# Patient Record
Sex: Female | Born: 1980 | Race: Black or African American | Hispanic: No | Marital: Single | Smoking: Current every day smoker
Health system: Southern US, Community
[De-identification: ages and names within clinical notes are randomized; demographics above are authoritative.]

## PROBLEM LIST (undated history)

## (undated) DIAGNOSIS — Z720 Tobacco use: Secondary | ICD-10-CM

## (undated) DIAGNOSIS — C259 Malignant neoplasm of pancreas, unspecified: Secondary | ICD-10-CM

## (undated) DIAGNOSIS — Z3483 Encounter for supervision of other normal pregnancy, third trimester: Secondary | ICD-10-CM

## (undated) DIAGNOSIS — Z86711 Personal history of pulmonary embolism: Secondary | ICD-10-CM

---

## 2010-09-14 DIAGNOSIS — I639 Cerebral infarction, unspecified: Secondary | ICD-10-CM

## 2010-09-14 HISTORY — DX: Cerebral infarction, unspecified: I63.9

## 2011-02-26 ENCOUNTER — Inpatient Hospital Stay (HOSPITAL_COMMUNITY)
Admission: EM | Admit: 2011-02-26 | Discharge: 2011-02-28 | DRG: 766 | Disposition: A | Discharge status: Home / Self Care | Payer: Medicaid Other | Source: Ambulatory Visit | Attending: Obstetrics and Gynecology | Admitting: Obstetrics and Gynecology

## 2011-02-26 ENCOUNTER — Inpatient Hospital Stay (HOSPITAL_COMMUNITY)
Admission: RE | Admit: 2011-02-26 | Payer: Medicaid Other | Source: Ambulatory Visit | Admitting: Obstetrics and Gynecology

## 2011-02-26 ENCOUNTER — Other Ambulatory Visit: Payer: Self-pay | Admitting: Obstetrics and Gynecology

## 2011-02-26 DIAGNOSIS — O34219 Maternal care for unspecified type scar from previous cesarean delivery: Principal | ICD-10-CM | POA: Diagnosis present

## 2011-02-26 DIAGNOSIS — O9903 Anemia complicating the puerperium: Secondary | ICD-10-CM | POA: Diagnosis not present

## 2011-02-26 DIAGNOSIS — D649 Anemia, unspecified: Secondary | ICD-10-CM | POA: Diagnosis not present

## 2011-02-26 LAB — RAPID URINE DRUG SCREEN, HOSP PERFORMED
Amphetamines: NOT DETECTED
Cocaine: NOT DETECTED
Opiates: NOT DETECTED
Opiates: NOT DETECTED
Tetrahydrocannabinol: NOT DETECTED

## 2011-02-26 LAB — CBC
Hemoglobin: 12 g/dL (ref 12.0–15.0)
MCH: 30.2 pg (ref 26.0–34.0)
MCHC: 34.1 g/dL (ref 30.0–36.0)
RDW: 14.5 % (ref 11.5–15.5)

## 2011-02-26 LAB — SURGICAL PCR SCREEN: Staphylococcus aureus: NEGATIVE

## 2011-02-27 LAB — CBC
HCT: 29.3 % — ABNORMAL LOW (ref 36.0–46.0)
MCH: 29.9 pg (ref 26.0–34.0)
MCV: 88.5 fL (ref 78.0–100.0)
RBC: 3.31 MIL/uL — ABNORMAL LOW (ref 3.87–5.11)
WBC: 10.9 10*3/uL — ABNORMAL HIGH (ref 4.0–10.5)

## 2011-03-01 ENCOUNTER — Inpatient Hospital Stay (HOSPITAL_COMMUNITY): Admission: AD | Admit: 2011-03-01 | Payer: Self-pay | Source: Ambulatory Visit | Admitting: Family Medicine

## 2011-03-04 NOTE — Op Note (Signed)
[name], [name]             ACCOUNT NO.:  [id number]  MEDICAL RECORD NO.:  [id number]  LOCATION:  9101                          FACILITY:  [facility]  PHYSICIAN:  Hal Morales, M.D.DATE OF BIRTH:  1980-09-25  DATE OF PROCEDURE:  02/26/2011 DATE OF DISCHARGE:                              OPERATIVE REPORT   PREOPERATIVE DIAGNOSES:  Intrauterine pregnancy at 70 weeks' gestation, prior cesarean section, desire for repeat cesarean section.  POSTOPERATIVE DIAGNOSES:  Intrauterine pregnancy at [redacted] weeks gestation.                           Prior Cesarean section.                           Desire for repeat cesarean section                           Meconium stained amniotic fluid SURGEON:  Hal Morales, MD  FIRST ASSISTANT:  Elmira J. Lowell Guitar, PA  ANESTHESIA:  Spinal.  ESTIMATED BLOOD LOSS:  Less than 700 mL.  COMPLICATIONS:  None.  FINDINGS:  The patient was delivered of a female infant weighing 6 pounds 3 ounces with Apgars of 9 and 9 at 1 and 5 minutes respectively. The uterus, tubes, and ovaries were normal for the gravid state.  The placenta contained an eccentrically inserted three-vessel cord and several areas of calcification in a bilobed placenta.  PROCEDURE IN DETAIL:  The patient was taken to the operating room after appropriate identification and placed on the operating table.  After the placement of a spinal anesthetic, she was placed in the supine position with a left lateral tilt.  The abdomen and perineum were prepped with multiple layers of Betadine and a Foley catheter inserted into the bladder and then connected to straight drainage.  The abdomen was draped as a sterile field.  After assurance of adequate surgical anesthesia, suprapubic injection of 10 mL of 0.25% Marcaine was undertaken.  A suprapubic incision was made and the abdomen opened in layers.  The peritoneum was entered and the bladder blade placed.  The uterus was incised approximately 2  cm above the uterovesical fold and that incision taken laterally on either side bluntly.  The infant was delivered from the occiput anterior position and the fluid was noted to be meconium stained.  The cord was clamped and cut and the infant handed off to the awaiting pediatricians.  The appropriate cord blood was drawn and the placenta allowed to separate from the uterus and then was removed from the operative field.  The uterine cavity was cleared of membranes and the uterine incision closed with running interlocking suture of 0- Vicryl.  An imbricating suture of 0-Vicryl was then placed.  Hemostasis was achieved with several figure-of-eight sutures of 0-Vicryl long the suture line.  Copious irrigation was carried out.  The bladder flap was reapproximated with a single figure-of-eight suture of 0-Vicryl.  The abdominal peritoneum was then closed with a running suture of 2-0 Vicryl.  The rectus muscles were reapproximated in the midline with figure-of-eight  suture of 2-0 Vicryl.  The fascia was closed with a running suture of 0-Vicryl, then reinforced on either side of midline with figure-of-eight sutures of 0-Vicryl.  The subcutaneous tissue was copiously irrigated and made hemostatic with Bovie cautery.  The skin incision was closed with subcuticular sutures of 3-0 Monocryl.  A sterile dressing was applied.  The patient was taken from the operating room to the recovery room in satisfactory condition having tolerated the procedure well with sponge and instrument counts correct.  SPECIMENS TO PATHOLOGY:  Placenta and urine for urine drug screen.     Hal Morales, M.D.     VPH/MEDQ  D:  02/26/2011  T:  02/27/2011  Job:  [id number]  Electronically Signed by Dierdre Forth M.D. on 03/04/2011 10:34:33 AM

## 2011-03-18 NOTE — Discharge Summary (Signed)
  Cynthia [name], Cynthia [name]             ACCOUNT NO.:  [id number]  MEDICAL RECORD NO.:  [id number]  LOCATION:  9101                          [facility]  [facility]  PHYSICIAN:  Zyen Triggs A. Leevon Upperman, M.D. DATE OF BIRTH:  1980-10-23  DATE OF ADMISSION:  02/26/2011 DATE OF DISCHARGE:  02/28/2011                              DISCHARGE SUMMARY   ADMITTING DIAGNOSES: 1. Intrauterine pregnancy at 39 weeks. 2. Previous cesarean section with desire for repeat.  DISCHARGE DIAGNOSES: 1. Intrauterine pregnancy at 39 weeks. 2. Previous cesarean section with desire for repeat.  PROCEDURES: 1. Primary low transverse cesarean section. 2. Spinal anesthesia.  HOSPITAL COURSE:  Cynthia [name] is a 30 year old gravida 5, para 4-0-0-4 at 39-3/7 weeks who presented for scheduled repeat cesarean section. Her pregnancy had been remarkable for: 1. Late care. 2. History of PIH with the last pregnancy. 3. History of previous C-section secondary to nonreassuring fetal     heart rate. 4. Anemia. 5. Smoker.  On admission, there was some question of her affect.  A drug screen was done which was negative, and she was desiring to proceed with repeat cesarean section.  She was taken to the operating room by Dr. Pennie Rushing where a repeat low transverse cesarean section was performed under spinal anesthesia.  Findings were a viable female weight 6 pounds 3 ounces, Apgars were 9 and 9.  Infant was taken to the full-term nursery. Mother was taken to recovery in good condition.  Placenta was sent to Pathology secondary to significant calcium deposits.  By postop day #1, the patient was doing well.  She was up ad lib and bottle feeding.  Her vital signs were stable.  Her hemoglobin was 9.9 down from 12, white blood cell count was 10.9, and platelet count was 170.  Her chest was clear.  Physical exam was within normal limits.  Her dressing was clean, dry, and intact.  Orthostatics were done and were noted to be stable, and she was  placed on iron supplementation.  By the next day, she was continuing to do well and was feeling well after initial evaluation, she decided that she wanted to go home.  Therefore, she was evaluated again in light of that desire, and she was deemed to have received full benefit of hospital stay and was discharged home in stable condition.  She was planning Nexplanon for birth control.  Discharge instructions per [facility] handout.  DISCHARGE MEDICATIONS:  Motrin 600 mg p.o. q.6 h p.r.n. pain, Percocet 5/325 one to two p.o. 3-4 hours p.r.n. pain.  Iron supplementation one p.o. daily or the patient may get Floradix to take her oral iron supplementation per packets directions.  FOLLOWUP:  Discharge follow up will occur in 6 weeks per [facility].     Cynthia [name], C.N.M.   ______________________________ Pierre Bali Normand Sloop, M.D.    VLL/MEDQ  D:  02/28/2011  T:  03/01/2011  Job:  [id number]  Electronically Signed by Nigel Bridgeman C.N.M. on 03/03/2011 08:55:51 AM Electronically Signed by Jaymes Graff M.D. on 03/18/2011 12:58:25 PM

## 2013-01-03 ENCOUNTER — Other Ambulatory Visit (HOSPITAL_COMMUNITY): Payer: Self-pay | Admitting: Obstetrics and Gynecology

## 2013-01-03 DIAGNOSIS — Z0489 Encounter for examination and observation for other specified reasons: Secondary | ICD-10-CM

## 2013-01-05 LAB — OB RESULTS CONSOLE HIV ANTIBODY (ROUTINE TESTING): HIV: NONREACTIVE

## 2013-01-05 LAB — OB RESULTS CONSOLE GC/CHLAMYDIA: Chlamydia: NEGATIVE

## 2013-01-05 LAB — OB RESULTS CONSOLE ABO/RH: RH Type: POSITIVE

## 2013-01-05 LAB — OB RESULTS CONSOLE HEPATITIS B SURFACE ANTIGEN: Hepatitis B Surface Ag: NEGATIVE

## 2013-01-26 ENCOUNTER — Ambulatory Visit (HOSPITAL_COMMUNITY)
Admission: RE | Admit: 2013-01-26 | Discharge: 2013-01-26 | Disposition: A | Discharge status: Home / Self Care | Payer: Medicaid Other | Source: Ambulatory Visit | Attending: Obstetrics and Gynecology | Admitting: Obstetrics and Gynecology

## 2013-01-26 ENCOUNTER — Encounter (HOSPITAL_COMMUNITY): Payer: Self-pay

## 2013-01-26 VITALS — BP 120/78 | HR 80 | Wt 166.0 lb

## 2013-01-26 DIAGNOSIS — Z1389 Encounter for screening for other disorder: Secondary | ICD-10-CM | POA: Insufficient documentation

## 2013-01-26 DIAGNOSIS — Z363 Encounter for antenatal screening for malformations: Secondary | ICD-10-CM | POA: Insufficient documentation

## 2013-01-26 DIAGNOSIS — O0930 Supervision of pregnancy with insufficient antenatal care, unspecified trimester: Secondary | ICD-10-CM | POA: Insufficient documentation

## 2013-01-26 DIAGNOSIS — O34219 Maternal care for unspecified type scar from previous cesarean delivery: Secondary | ICD-10-CM | POA: Insufficient documentation

## 2013-01-26 DIAGNOSIS — Z0489 Encounter for examination and observation for other specified reasons: Secondary | ICD-10-CM

## 2013-01-26 DIAGNOSIS — O358XX0 Maternal care for other (suspected) fetal abnormality and damage, not applicable or unspecified: Secondary | ICD-10-CM | POA: Insufficient documentation

## 2013-01-26 NOTE — Progress Notes (Signed)
Cynthia [name]  was seen today for an ultrasound appointment.  See full report in [facility].  Impression: Single IUP at 37 1/7 weeks Late onset of care Limited views of the fetal anatomy obtained due to advanced gestational age and fetal position No fetal anomalies idendified Fetal growth is appropriate (40th %tile) Normal amniotic fluid volume  Recommendations: Follow-up ultrasounds as clinically indicated.   Alpha Gula, MD

## 2013-02-01 ENCOUNTER — Encounter (HOSPITAL_COMMUNITY): Payer: Self-pay | Admitting: Pharmacist

## 2013-02-09 ENCOUNTER — Encounter (HOSPITAL_COMMUNITY): Payer: Self-pay | Admitting: *Deleted

## 2013-02-09 ENCOUNTER — Encounter (HOSPITAL_COMMUNITY): Payer: Self-pay | Admitting: Anesthesiology

## 2013-02-09 ENCOUNTER — Encounter (HOSPITAL_COMMUNITY)
Admission: AD | Disposition: A | Discharge status: Home / Self Care | Payer: Self-pay | Source: Ambulatory Visit | Attending: Obstetrics and Gynecology

## 2013-02-09 ENCOUNTER — Inpatient Hospital Stay (HOSPITAL_COMMUNITY)
Admission: AD | Admit: 2013-02-09 | Discharge: 2013-02-11 | DRG: 775 | Disposition: A | Discharge status: Home / Self Care | Payer: Medicaid Other | Source: Ambulatory Visit | Attending: Obstetrics and Gynecology | Admitting: Obstetrics and Gynecology

## 2013-02-09 DIAGNOSIS — O34219 Maternal care for unspecified type scar from previous cesarean delivery: Secondary | ICD-10-CM

## 2013-02-09 LAB — CBC
MCH: 29 pg (ref 26.0–34.0)
MCHC: 34.2 g/dL (ref 30.0–36.0)
MCV: 84.9 fL (ref 78.0–100.0)
Platelets: 168 10*3/uL (ref 150–400)
RDW: 14.1 % (ref 11.5–15.5)

## 2013-02-09 SURGERY — Surgical Case
Anesthesia: Regional

## 2013-02-09 MED ORDER — BENZOCAINE-MENTHOL 20-0.5 % EX AERO: 1.0000 "application " | INHALATION_SPRAY | CUTANEOUS | Status: DC | PRN

## 2013-02-09 MED ORDER — WITCH HAZEL-GLYCERIN EX PADS: 1.0000 "application " | MEDICATED_PAD | CUTANEOUS | Status: DC | PRN

## 2013-02-09 MED ORDER — DIPHENHYDRAMINE HCL 25 MG PO CAPS: 25.0000 mg | ORAL_CAPSULE | Freq: Four times a day (QID) | ORAL | Status: DC | PRN

## 2013-02-09 MED ORDER — SIMETHICONE 80 MG PO CHEW: 80.0000 mg | CHEWABLE_TABLET | ORAL | Status: DC | PRN

## 2013-02-09 MED ORDER — OXYTOCIN 40 UNITS IN LACTATED RINGERS INFUSION - SIMPLE MED
INTRAVENOUS | Status: AC
  Filled 2013-02-09: qty 1000

## 2013-02-09 MED ORDER — LACTATED RINGERS IV SOLN
INTRAVENOUS | Status: DC
  Administered 2013-02-09: 17:00:00 via INTRAVENOUS

## 2013-02-09 MED ORDER — ONDANSETRON HCL 4 MG PO TABS: 4.0000 mg | ORAL_TABLET | ORAL | Status: DC | PRN

## 2013-02-09 MED ORDER — PRENATAL MULTIVITAMIN CH
1.0000 | ORAL_TABLET | Freq: Every day | ORAL | Status: DC
  Administered 2013-02-10 – 2013-02-11 (×2): 1 via ORAL
  Filled 2013-02-09 (×2): qty 1

## 2013-02-09 MED ORDER — OXYTOCIN 10 UNIT/ML IJ SOLN
INTRAMUSCULAR | Status: AC
  Filled 2013-02-09: qty 2

## 2013-02-09 MED ORDER — DIBUCAINE 1 % RE OINT: 1.0000 "application " | TOPICAL_OINTMENT | RECTAL | Status: DC | PRN

## 2013-02-09 MED ORDER — ONDANSETRON HCL 4 MG/2ML IJ SOLN: 4.0000 mg | INTRAMUSCULAR | Status: DC | PRN

## 2013-02-09 MED ORDER — IBUPROFEN 600 MG PO TABS
600.0000 mg | ORAL_TABLET | Freq: Four times a day (QID) | ORAL | Status: DC
  Administered 2013-02-09 – 2013-02-11 (×8): 600 mg via ORAL
  Filled 2013-02-09 (×7): qty 1

## 2013-02-09 MED ORDER — OXYCODONE-ACETAMINOPHEN 5-325 MG PO TABS: 1.0000 | ORAL_TABLET | ORAL | Status: DC | PRN

## 2013-02-09 MED ORDER — SENNOSIDES-DOCUSATE SODIUM 8.6-50 MG PO TABS
2.0000 | ORAL_TABLET | Freq: Every day | ORAL | Status: DC
  Administered 2013-02-09 – 2013-02-10 (×2): 2 via ORAL

## 2013-02-09 MED ORDER — ZOLPIDEM TARTRATE 5 MG PO TABS: 5.0000 mg | ORAL_TABLET | Freq: Every evening | ORAL | Status: DC | PRN

## 2013-02-09 MED ORDER — LANOLIN HYDROUS EX OINT: TOPICAL_OINTMENT | CUTANEOUS | Status: DC | PRN

## 2013-02-09 MED ORDER — TETANUS-DIPHTH-ACELL PERTUSSIS 5-2.5-18.5 LF-MCG/0.5 IM SUSP: 0.5000 mL | Freq: Once | INTRAMUSCULAR | Status: DC

## 2013-02-09 SURGICAL SUPPLY — 31 items
CLAMP CORD UMBIL (MISCELLANEOUS) IMPLANT
CLOTH BEACON ORANGE TIMEOUT ST (SAFETY) IMPLANT
CONTAINER PREFILL 10% NBF 15ML (MISCELLANEOUS) IMPLANT
DRAPE LG THREE QUARTER DISP (DRAPES) IMPLANT
DRSG OPSITE POSTOP 4X10 (GAUZE/BANDAGES/DRESSINGS) IMPLANT
DRSG VASELINE 3X18 (GAUZE/BANDAGES/DRESSINGS) IMPLANT
DURAPREP 26ML APPLICATOR (WOUND CARE) IMPLANT
ELECT REM PT RETURN 9FT ADLT (ELECTROSURGICAL)
ELECTRODE REM PT RTRN 9FT ADLT (ELECTROSURGICAL) IMPLANT
EXTRACTOR VACUUM KIWI (MISCELLANEOUS) IMPLANT
EXTRACTOR VACUUM M CUP 4 TUBE (SUCTIONS) IMPLANT
GLOVE BIO SURGEON STRL SZ7.5 (GLOVE) IMPLANT
GOWN PREVENTION PLUS XLARGE (GOWN DISPOSABLE) IMPLANT
GOWN STRL REIN XL XLG (GOWN DISPOSABLE) IMPLANT
KIT ABG SYR 3ML LUER SLIP (SYRINGE) IMPLANT
NEEDLE HYPO 25X5/8 SAFETYGLIDE (NEEDLE) IMPLANT
NS IRRIG 1000ML POUR BTL (IV SOLUTION) IMPLANT
PACK C SECTION WH (CUSTOM PROCEDURE TRAY) IMPLANT
PAD OB MATERNITY 4.3X12.25 (PERSONAL CARE ITEMS) IMPLANT
RTRCTR C-SECT PINK 25CM LRG (MISCELLANEOUS) IMPLANT
STAPLER VISISTAT 35W (STAPLE) IMPLANT
SUT PLAIN 0 NONE (SUTURE) IMPLANT
SUT VIC AB 0 CT1 36 (SUTURE) IMPLANT
SUT VIC AB 3-0 CTX 36 (SUTURE) IMPLANT
SUT VIC AB 3-0 SH 27 (SUTURE)
SUT VIC AB 3-0 SH 27X BRD (SUTURE) IMPLANT
SUT VIC AB 4-0 KS 27 (SUTURE) IMPLANT
SUT VICRYL 0 TIES 12 18 (SUTURE) IMPLANT
TOWEL OR 17X24 6PK STRL BLUE (TOWEL DISPOSABLE) IMPLANT
TRAY FOLEY CATH 14FR (SET/KITS/TRAYS/PACK) IMPLANT
WATER STERILE IRR 1000ML POUR (IV SOLUTION) IMPLANT

## 2013-02-09 NOTE — MAU Note (Signed)
Patient states she is having contractions every 10 minutes with slight discharge. Denies bleeding or leaking fluid and reports good fetal movement.

## 2013-02-09 NOTE — Progress Notes (Addendum)
Pt mother came to nurses station and yelled out the baby is coming. Went into room. Crowning noted. Elige Radon called and at bedside for delivery. Pt pushed and fetal head delivered. Followed by shoulders NSVD of viable baby girl @1546 .

## 2013-02-09 NOTE — MAU Note (Signed)
Patient is scheduled for a repeat cesarean section on 6-4.

## 2013-02-09 NOTE — MAU Provider Note (Signed)
  History     CSN: [id number]  Arrival date and time: 02/09/13 1352   None     Chief Complaint  Patient presents with  . Labor Eval   HPI OB History   Grav Para Term Preterm Abortions TAB SAB Ect Mult Living   6 5 5       5       Past Medical History  Diagnosis Date  . Medical history non-contributory     Past Surgical History  Procedure Laterality Date  . Cesarean section      No family history on file.  History  Substance Use Topics  . Smoking status: Current Every Day Smoker    Types: Cigarettes  . Smokeless tobacco: Not on file  . Alcohol Use: No    Allergies: No Known Allergies  Prescriptions prior to admission  Medication Sig Dispense Refill  . Prenatal Vit-Fe Fumarate-FA (PRENATAL MULTIVITAMIN) TABS Take 1 tablet by mouth daily at 12 noon.        ROS Physical Exam   Blood pressure 127/75, pulse 62, temperature 98.4 F (36.9 C), temperature source Oral, resp. rate 16, height 5' 3.5" (1.613 m), weight 164 lb 12.8 oz (74.753 kg), last menstrual period 05/11/2012, SpO2 100.00%.  Physical Exam  MAU Course  Procedures  MDM  Assessment and Plan  f2  Loyd Salvador F 02/09/2013, 4:16 PM The pt delivered in MAU I was called after the delivery. When I arrived the placenta had delivered. I inspected the perineum and there were no lacerations The uterus felt intact and clots were evacuated. The uterus felt firm

## 2013-02-09 NOTE — H&P (Signed)
NAMELENNYN, GANGE             ACCOUNT NO.:  [id number]  MEDICAL RECORD NO.:  [id number]  LOCATION:  VH84                          [facility]ACILITY:  [facility]  PHYSICIAN:  Malachi Pro. Ambrose Mantle, M.D. DATE OF BIRTH:  1980-11-28  DATE OF ADMISSION:  02/09/2013 DATE OF DISCHARGE:                             HISTORY & PHYSICAL   HISTORY OF PRESENT ILLNESS:  This is a 32 year old black female, para 5- 0-0-5, who is admitted to the hospital for a labor check and then rupture of membranes, and delivered in the maternity admission unit, while being prepared for a C-section.  PAST MEDICAL HISTORY:  No known allergies.  She had a history of urinary tract infections.  OPERATIONS:  Two C-sections.  LABORATORY DATA:  Blood group and type A positive with negative antibody.  Pap smear normal.  Rubella immune.  RPR nonreactive.  Urine culture positive treated with Macrobid.  Hepatitis B surface antigen negative.  HIV negative.  Hemoglobin electrophoresis AA.  Chlamydia and GC negative.  One-hour Glucola 127, group B strep positive.  The patient was laid to prenatal care at our office beginning at 34 weeks and 1-day.  The patient was reviewed sterilization and she stated she was not ready for sterilization.  The patient came to the maternity admission unit for evaluation of contractions and I was called and told that the patient was there for evaluation of contractions and that during the exam, she had rupture of membranes.  PHYSICAL EXAMINATION:  VITAL SIGNS:  Normal.  Temperature 98.4, pulse 62, respirations 16, blood pressure 127/75. HEART:  Normal size and sounds.  No murmurs. LUNGS:  Clear to auscultation.  I had last communicated with the nurse at approximately 3:40 and set up a C-section for 4:15, was told the patient was contracting every 10 minutes.  I was called back just a few minutes later and told by the head nurse and the maternity admission unit that the patient had delivered.  The  delivery was attended by nurse midwife.  The baby had good Apgars apparently, was a female.  I came to the hospital.  The placenta had already been delivered.  There were no vulvar lacerations as far as I could tell, the scars of the uterus were intact.  I evacuated clots from the uterus and the uterus felt firm.  ADMITTING IMPRESSION:  Intrauterine pregnancy at 39 weeks and 1 day, with 2 prior C-sections.  During her observation in maternity admission unit, being prepared for C-section, she delivered in the maternity admission unit without any warning.  The patient will be admitted for postpartum care.     Malachi Pro. Ambrose Mantle, M.D.     TFH/MEDQ  D:  02/09/2013  T:  02/09/2013  Job:  [id number]

## 2013-02-09 NOTE — Anesthesia Preprocedure Evaluation (Deleted)

## 2013-02-10 LAB — CBC
HCT: 27.1 % — ABNORMAL LOW (ref 36.0–46.0)
Hemoglobin: 9.3 g/dL — ABNORMAL LOW (ref 12.0–15.0)
MCH: 29.1 pg (ref 26.0–34.0)
MCV: 84.7 fL (ref 78.0–100.0)
RBC: 3.2 MIL/uL — ABNORMAL LOW (ref 3.87–5.11)

## 2013-02-10 NOTE — Progress Notes (Signed)
Patient ID: Cynthia [name], female   DOB: 10-24-1980, 32 y.o.   MRN: [id number] #1 afebrile No desire for d/c today Doing well

## 2013-02-10 NOTE — Clinical Social Work Maternal (Signed)
     Clinical Social Work Department PSYCHOSOCIAL ASSESSMENT - MATERNAL/CHILD 02/10/2013  Patient:  Cynthia [name], Cynthia [name]  Account Number:  [id number]  Admit Date:  02/09/2013  Marjo Bicker Name:   Cynthia [name]    Clinical Social Worker:  Nobie Putnam, LCSW   Date/Time:  02/10/2013 12:21 PM  Date Referred:  02/10/2013   Referral source  CN     Referred reason  [facility]   Other referral source:    I:  FAMILY / HOME ENVIRONMENT Marjo Bicker legal guardian:  PARENT  Guardian - Name Guardian - Age Guardian - Address  Cynthia [name] [address] [place], [place] [address]  [place] [address]    Other household support members/support persons Name Relationship DOB   DAUGHTER 08/16/00   SON 07/03/01   DAUGHTER 10/19/03   DAUGHTER 09/18/05   DAUGHTER 02/26/11   Other support:   Dannial Monarch, pt's mother    II  PSYCHOSOCIAL DATA Information Source:  Patient Interview  Surveyor, quantity and Community Resources Employment:   Financial resources:  Medicaid If Medicaid - County:  [employer] Other  Chemical engineer / Grade:   Maternity Care Coordinator / Child Services Coordination / Early Interventions:   Yes  Cultural issues impacting care:    III  STRENGTHS Strengths  Adequate Resources  Home prepared for Child (including basic supplies)  Supportive family/friends   Strength comment:    IV  RISK FACTORS AND CURRENT PROBLEMS Current Problem:  YES   Risk Factor & Current Problem Patient Issue Family Issue Risk Factor / Current Problem Comment  Other - See comment Y N LPNC @ 34 weeks    V  SOCIAL WORK ASSESSMENT CSW met with pt to assess reason for [facility] @ 34 weeks.  Pt received pregnancy confirmation at 3 months.  She applied for Medicaid, at that time but states if took 2 months to receive Medicaid benefits.  She denies any illegal substance use & verbalized understanding of hospital drug testing policy.  UDS collection pending, as well as meconium results.  Pt has previous  history with CPS but denies involvement since 2010.  CSW verified information with [facility] CPS intake worker.  Pt has supplies for the infant.  Pt's affect appeared to be flat however she denies any depression history.  FOB is involved, as per pt.  CSW will continue to monitor drug screen results and make a referral if necessary.      VI SOCIAL WORK PLAN Social Work Plan  No Further Intervention Required / No Barriers to Discharge   Type of pt/family education:   If child protective services report - county:   If child protective services report - date:   Information/referral to community resources comment:   Other social work plan:

## 2013-02-10 NOTE — Progress Notes (Signed)
UR chart review completed.  

## 2013-02-11 DIAGNOSIS — O34219 Maternal care for unspecified type scar from previous cesarean delivery: Secondary | ICD-10-CM | POA: Diagnosis not present

## 2013-02-11 NOTE — Progress Notes (Signed)
PPD #2 No problems Afeb, VSS Fundus firm D/c home 

## 2013-02-11 NOTE — Discharge Summary (Signed)
Obstetric Discharge Summary Reason for Admission: onset of labor and rupture of membranes Prenatal Procedures: none Intrapartum Procedures: spontaneous vaginal delivery Postpartum Procedures: none Complications-Operative and Postpartum: none Hemoglobin  Date Value Range Status  02/10/2013 9.3* 12.0 - 15.0 g/dL Final     HCT  Date Value Range Status  02/10/2013 27.1* 36.0 - 46.0 % Final    Physical Exam:  General: alert Lochia: appropriate Uterine Fundus: firm  Discharge Diagnoses: Term Pregnancy-delivered  Discharge Information: Date: 02/11/2013 Activity: pelvic rest Diet: routine Medications: Ibuprofen Condition: stable Instructions: refer to practice specific booklet Discharge to: home Follow-up Information   Follow up with Bing Plume, MD. Schedule an appointment as soon as possible for a visit in 6 weeks.   Contact information:   [address], [address] [place] [address] [phone]       Newborn Data: Live born female  Birth Weight: 6 lb 7.5 oz (2935 g) APGAR: 9, 9  Home with mother.  Shanik Brookshire D 02/11/2013, 10:08 AM

## 2013-02-13 ENCOUNTER — Inpatient Hospital Stay (HOSPITAL_COMMUNITY): Admission: RE | Admit: 2013-02-13 | Payer: Medicaid Other | Source: Ambulatory Visit

## 2013-02-15 ENCOUNTER — Encounter (HOSPITAL_COMMUNITY): Admission: AD | Payer: Self-pay | Source: Ambulatory Visit

## 2013-02-15 ENCOUNTER — Inpatient Hospital Stay (HOSPITAL_COMMUNITY)
Admission: AD | Admit: 2013-02-15 | Payer: Medicaid Other | Source: Ambulatory Visit | Admitting: Obstetrics and Gynecology

## 2013-02-15 SURGERY — Surgical Case
Anesthesia: Regional

## 2014-07-16 ENCOUNTER — Encounter (HOSPITAL_COMMUNITY): Payer: Self-pay | Admitting: *Deleted

## 2014-09-27 ENCOUNTER — Encounter (HOSPITAL_COMMUNITY): Payer: Self-pay | Admitting: Obstetrics and Gynecology

## 2014-11-22 LAB — OB RESULTS CONSOLE GC/CHLAMYDIA
CHLAMYDIA, DNA PROBE: NEGATIVE
Gonorrhea: NEGATIVE

## 2014-11-22 LAB — OB RESULTS CONSOLE ABO/RH: RH Type: POSITIVE

## 2014-11-22 LAB — OB RESULTS CONSOLE RPR: RPR: NONREACTIVE

## 2014-11-22 LAB — OB RESULTS CONSOLE HEPATITIS B SURFACE ANTIGEN: Hepatitis B Surface Ag: NEGATIVE

## 2014-11-22 LAB — OB RESULTS CONSOLE RUBELLA ANTIBODY, IGM: Rubella: IMMUNE

## 2014-11-22 LAB — OB RESULTS CONSOLE ANTIBODY SCREEN: Antibody Screen: NEGATIVE

## 2014-12-17 LAB — OB RESULTS CONSOLE HIV ANTIBODY (ROUTINE TESTING)
HIV: NONREACTIVE
HIV: NONREACTIVE

## 2015-02-21 ENCOUNTER — Encounter (HOSPITAL_COMMUNITY): Payer: Self-pay | Admitting: Obstetrics and Gynecology

## 2015-02-25 ENCOUNTER — Encounter (HOSPITAL_COMMUNITY): Payer: Self-pay

## 2015-02-26 ENCOUNTER — Inpatient Hospital Stay (HOSPITAL_COMMUNITY)
Admission: RE | Admit: 2015-02-26 | Discharge: 2015-02-26 | Disposition: A | Discharge status: Home / Self Care | Payer: Medicaid Other | Source: Ambulatory Visit

## 2015-02-26 ENCOUNTER — Encounter (HOSPITAL_COMMUNITY): Payer: Self-pay | Admitting: *Deleted

## 2015-02-26 ENCOUNTER — Encounter (HOSPITAL_COMMUNITY): Payer: Self-pay

## 2015-02-26 ENCOUNTER — Inpatient Hospital Stay (HOSPITAL_COMMUNITY)
Admission: AD | Admit: 2015-02-26 | Discharge: 2015-03-01 | DRG: 766 | Disposition: A | Discharge status: Home / Self Care | Payer: Medicaid Other | Source: Ambulatory Visit | Attending: Obstetrics and Gynecology | Admitting: Obstetrics and Gynecology

## 2015-02-26 ENCOUNTER — Encounter (HOSPITAL_COMMUNITY)
Admission: RE | Admit: 2015-02-26 | Discharge: 2015-02-26 | Disposition: A | Discharge status: Home / Self Care | Payer: Medicaid Other | Source: Ambulatory Visit | Attending: Obstetrics and Gynecology | Admitting: Obstetrics and Gynecology

## 2015-02-26 DIAGNOSIS — F17210 Nicotine dependence, cigarettes, uncomplicated: Secondary | ICD-10-CM | POA: Diagnosis present

## 2015-02-26 DIAGNOSIS — Z98891 History of uterine scar from previous surgery: Secondary | ICD-10-CM

## 2015-02-26 DIAGNOSIS — O3421 Maternal care for scar from previous cesarean delivery: Principal | ICD-10-CM | POA: Diagnosis present

## 2015-02-26 DIAGNOSIS — Z3A40 40 weeks gestation of pregnancy: Secondary | ICD-10-CM | POA: Diagnosis present

## 2015-02-26 DIAGNOSIS — O480 Post-term pregnancy: Secondary | ICD-10-CM | POA: Diagnosis present

## 2015-02-26 DIAGNOSIS — O99334 Smoking (tobacco) complicating childbirth: Secondary | ICD-10-CM | POA: Diagnosis present

## 2015-02-26 LAB — CBC
HCT: 30.5 % — ABNORMAL LOW (ref 36.0–46.0)
Hemoglobin: 10.3 g/dL — ABNORMAL LOW (ref 12.0–15.0)
MCH: 29.1 pg (ref 26.0–34.0)
MCHC: 33.8 g/dL (ref 30.0–36.0)
MCV: 86.2 fL (ref 78.0–100.0)
PLATELETS: 137 10*3/uL — AB (ref 150–400)
RBC: 3.54 MIL/uL — ABNORMAL LOW (ref 3.87–5.11)
RDW: 14.9 % (ref 11.5–15.5)
WBC: 5.6 10*3/uL (ref 4.0–10.5)

## 2015-02-26 LAB — TYPE AND SCREEN
ABO/RH(D): A POS
ANTIBODY SCREEN: NEGATIVE

## 2015-02-26 NOTE — Pre-Procedure Instructions (Signed)
Patient's platelet count was 137,000 at PAT appointment. Anesthesia notified and no orders received.

## 2015-02-26 NOTE — MAU Note (Signed)
PT  SAYS SHE IS  [facility]  FOR  C/S ON 6-16     REPEAT.      HURT  BAD  WITH UC  AT 930PM.   VE  IN OFFICE  2  CM.    DENIES HSV  AND  MRSA.

## 2015-02-26 NOTE — Patient Instructions (Signed)
Your procedure is scheduled on: February 28, 2015   Enter through the Main Entrance of [facility] at: 0600 am   Pick up the phone at the desk and dial [phone].  Call this number if you have problems the morning of surgery: [phone].  Remember: Do NOT eat food:  After midnight on Wednesday  Do NOT drink clear liquids after: midnight on Wednesday  Take these medicines the morning of surgery with a SIP OF WATER: none   Do NOT wear jewelry (body piercing), metal hair clips/bobby pins,or nail polish. Do NOT wear lotions, powders, or perfumes.  You may wear deoderant. Do NOT shave for 48 hours prior to surgery. Do NOT bring valuables to the hospital. Leave suitcase in car.  After surgery it may be brought to your room.  For patients admitted to the hospital, checkout time is 11:00 AM the day of discharge.

## 2015-02-26 NOTE — Patient Instructions (Signed)
Your procedure is scheduled on: February 28, 2015  Enter through the Main Entrance of [facility] at:  0600  Pick up the phone at the desk and dial [phone].  Call this number if you have problems the morning of surgery: [phone].  Remember: Do NOT eat food: after midnight  Do NOT drink clear liquids after:  Midnight  Take these medicines the morning of surgery with a SIP OF WATER:  None   Do NOT wear jewelry (body piercing), metal hair clips/bobby pins, or nail polish. Do NOT wear lotions, powders, or perfumes.  You may wear deoderant. Do NOT shave for 48 hours prior to surgery. Do NOT bring valuables to the hospital.+ Leave suitcase in car.  After surgery it may be brought to your room.  For patients admitted to the hospital, checkout time is 11:00 AM the day of discharge.

## 2015-02-27 ENCOUNTER — Inpatient Hospital Stay (HOSPITAL_COMMUNITY): Payer: Medicaid Other | Admitting: Anesthesiology

## 2015-02-27 ENCOUNTER — Encounter (HOSPITAL_COMMUNITY): Payer: Self-pay | Admitting: *Deleted

## 2015-02-27 ENCOUNTER — Encounter (HOSPITAL_COMMUNITY)
Admission: AD | Disposition: A | Discharge status: Home / Self Care | Payer: Self-pay | Source: Ambulatory Visit | Attending: Obstetrics and Gynecology

## 2015-02-27 DIAGNOSIS — O3421 Maternal care for scar from previous cesarean delivery: Secondary | ICD-10-CM | POA: Diagnosis present

## 2015-02-27 DIAGNOSIS — O480 Post-term pregnancy: Secondary | ICD-10-CM | POA: Diagnosis present

## 2015-02-27 DIAGNOSIS — Z3A40 40 weeks gestation of pregnancy: Secondary | ICD-10-CM | POA: Diagnosis present

## 2015-02-27 DIAGNOSIS — F17210 Nicotine dependence, cigarettes, uncomplicated: Secondary | ICD-10-CM | POA: Diagnosis present

## 2015-02-27 DIAGNOSIS — Z98891 History of uterine scar from previous surgery: Secondary | ICD-10-CM

## 2015-02-27 DIAGNOSIS — O99334 Smoking (tobacco) complicating childbirth: Secondary | ICD-10-CM | POA: Diagnosis present

## 2015-02-27 DIAGNOSIS — Z3483 Encounter for supervision of other normal pregnancy, third trimester: Secondary | ICD-10-CM | POA: Diagnosis present

## 2015-02-27 LAB — RPR: RPR: NONREACTIVE

## 2015-02-27 LAB — OB RESULTS CONSOLE GBS: GBS: NEGATIVE

## 2015-02-27 SURGERY — Surgical Case
Anesthesia: Spinal | Site: Abdomen

## 2015-02-27 MED ORDER — LACTATED RINGERS IV BOLUS (SEPSIS)
1000.0000 mL | Freq: Once | INTRAVENOUS | Status: AC
  Administered 2015-02-27: 1000 mL via INTRAVENOUS

## 2015-02-27 MED ORDER — SIMETHICONE 80 MG PO CHEW
80.0000 mg | CHEWABLE_TABLET | Freq: Three times a day (TID) | ORAL | Status: DC
  Administered 2015-02-27 – 2015-02-28 (×5): 80 mg via ORAL
  Filled 2015-02-27 (×6): qty 1

## 2015-02-27 MED ORDER — SIMETHICONE 80 MG PO CHEW
80.0000 mg | CHEWABLE_TABLET | ORAL | Status: DC
  Administered 2015-02-27 – 2015-03-01 (×2): 80 mg via ORAL
  Filled 2015-02-27 (×2): qty 1

## 2015-02-27 MED ORDER — DIBUCAINE 1 % RE OINT: 1.0000 "application " | TOPICAL_OINTMENT | RECTAL | Status: DC | PRN

## 2015-02-27 MED ORDER — DIPHENHYDRAMINE HCL 25 MG PO CAPS
25.0000 mg | ORAL_CAPSULE | Freq: Four times a day (QID) | ORAL | Status: DC | PRN
  Filled 2015-02-27: qty 1

## 2015-02-27 MED ORDER — PHENYLEPHRINE 8 MG IN D5W 100 ML (0.08MG/ML) PREMIX OPTIME
INJECTION | INTRAVENOUS | Status: DC | PRN
  Administered 2015-02-27: 40 ug/min via INTRAVENOUS

## 2015-02-27 MED ORDER — SIMETHICONE 80 MG PO CHEW
80.0000 mg | CHEWABLE_TABLET | ORAL | Status: DC | PRN
  Filled 2015-02-27: qty 1

## 2015-02-27 MED ORDER — CITRIC ACID-SODIUM CITRATE 334-500 MG/5ML PO SOLN
30.0000 mL | Freq: Once | ORAL | Status: AC
  Administered 2015-02-27: 30 mL via ORAL
  Filled 2015-02-27: qty 15

## 2015-02-27 MED ORDER — SENNOSIDES-DOCUSATE SODIUM 8.6-50 MG PO TABS
2.0000 | ORAL_TABLET | ORAL | Status: DC
  Administered 2015-02-27 – 2015-03-01 (×2): 2 via ORAL
  Filled 2015-02-27 (×2): qty 2

## 2015-02-27 MED ORDER — FAMOTIDINE IN NACL 20-0.9 MG/50ML-% IV SOLN
20.0000 mg | Freq: Once | INTRAVENOUS | Status: AC
  Administered 2015-02-27: 20 mg via INTRAVENOUS
  Filled 2015-02-27: qty 50

## 2015-02-27 MED ORDER — MEPERIDINE HCL 25 MG/ML IJ SOLN: 6.2500 mg | INTRAMUSCULAR | Status: DC | PRN

## 2015-02-27 MED ORDER — FENTANYL CITRATE (PF) 100 MCG/2ML IJ SOLN
INTRAMUSCULAR | Status: DC | PRN
  Administered 2015-02-27: 12.5 ug via INTRATHECAL

## 2015-02-27 MED ORDER — SCOPOLAMINE 1 MG/3DAYS TD PT72
MEDICATED_PATCH | TRANSDERMAL | Status: DC | PRN
  Administered 2015-02-27: 1 via TRANSDERMAL

## 2015-02-27 MED ORDER — LANOLIN HYDROUS EX OINT: 1.0000 "application " | TOPICAL_OINTMENT | CUTANEOUS | Status: DC | PRN

## 2015-02-27 MED ORDER — NALBUPHINE HCL 10 MG/ML IJ SOLN: 5.0000 mg | INTRAMUSCULAR | Status: DC | PRN

## 2015-02-27 MED ORDER — HYDROMORPHONE HCL 1 MG/ML IJ SOLN
INTRAMUSCULAR | Status: AC
  Administered 2015-02-27: 0.5 mg via INTRAVENOUS
  Filled 2015-02-27: qty 1

## 2015-02-27 MED ORDER — KETOROLAC TROMETHAMINE 30 MG/ML IJ SOLN
INTRAMUSCULAR | Status: AC
  Administered 2015-02-27: 30 mg via INTRAMUSCULAR
  Filled 2015-02-27: qty 1

## 2015-02-27 MED ORDER — MEASLES, MUMPS & RUBELLA VAC ~~LOC~~ INJ
0.5000 mL | INJECTION | Freq: Once | SUBCUTANEOUS | Status: DC
  Filled 2015-02-27: qty 0.5

## 2015-02-27 MED ORDER — KETOROLAC TROMETHAMINE 30 MG/ML IJ SOLN
30.0000 mg | Freq: Once | INTRAMUSCULAR | Status: DC | PRN
Start: 2015-02-27 — End: 2015-02-27

## 2015-02-27 MED ORDER — DIPHENHYDRAMINE HCL 50 MG/ML IJ SOLN: 12.5000 mg | INTRAMUSCULAR | Status: DC | PRN

## 2015-02-27 MED ORDER — DIPHENHYDRAMINE HCL 25 MG PO CAPS
25.0000 mg | ORAL_CAPSULE | ORAL | Status: DC | PRN
  Administered 2015-02-27: 25 mg via ORAL

## 2015-02-27 MED ORDER — KETOROLAC TROMETHAMINE 30 MG/ML IJ SOLN
30.0000 mg | Freq: Four times a day (QID) | INTRAMUSCULAR | Status: DC | PRN
  Administered 2015-02-27: 30 mg via INTRAMUSCULAR

## 2015-02-27 MED ORDER — OXYTOCIN 10 UNIT/ML IJ SOLN
40.0000 [IU] | INTRAMUSCULAR | Status: DC | PRN
  Administered 2015-02-27: 40 [IU] via INTRAVENOUS

## 2015-02-27 MED ORDER — ZOLPIDEM TARTRATE 5 MG PO TABS: 5.0000 mg | ORAL_TABLET | Freq: Every evening | ORAL | Status: DC | PRN

## 2015-02-27 MED ORDER — PROMETHAZINE HCL 25 MG/ML IJ SOLN: 6.2500 mg | INTRAMUSCULAR | Status: DC | PRN

## 2015-02-27 MED ORDER — OXYCODONE-ACETAMINOPHEN 5-325 MG PO TABS
1.0000 | ORAL_TABLET | ORAL | Status: DC | PRN
  Administered 2015-02-27 – 2015-03-01 (×7): 1 via ORAL
  Filled 2015-02-27 (×7): qty 1

## 2015-02-27 MED ORDER — LACTATED RINGERS IV SOLN
INTRAVENOUS | Status: DC
  Administered 2015-02-27 (×2): via INTRAVENOUS

## 2015-02-27 MED ORDER — KETOROLAC TROMETHAMINE 30 MG/ML IJ SOLN: 30.0000 mg | Freq: Four times a day (QID) | INTRAMUSCULAR | Status: DC | PRN

## 2015-02-27 MED ORDER — ONDANSETRON HCL 4 MG/2ML IJ SOLN
4.0000 mg | Freq: Three times a day (TID) | INTRAMUSCULAR | Status: DC | PRN
  Administered 2015-02-27: 4 mg via INTRAVENOUS
  Filled 2015-02-27: qty 2

## 2015-02-27 MED ORDER — DEXTROSE 5 % IV SOLN
1.0000 ug/kg/h | INTRAVENOUS | Status: DC | PRN
  Filled 2015-02-27: qty 2

## 2015-02-27 MED ORDER — OXYCODONE-ACETAMINOPHEN 5-325 MG PO TABS: 2.0000 | ORAL_TABLET | ORAL | Status: DC | PRN

## 2015-02-27 MED ORDER — IBUPROFEN 600 MG PO TABS
600.0000 mg | ORAL_TABLET | Freq: Four times a day (QID) | ORAL | Status: DC
  Administered 2015-02-27 – 2015-03-01 (×8): 600 mg via ORAL
  Filled 2015-02-27 (×8): qty 1

## 2015-02-27 MED ORDER — NALOXONE HCL 0.4 MG/ML IJ SOLN: 0.4000 mg | INTRAMUSCULAR | Status: DC | PRN

## 2015-02-27 MED ORDER — IBUPROFEN 600 MG PO TABS: 600.0000 mg | ORAL_TABLET | Freq: Four times a day (QID) | ORAL | Status: DC | PRN

## 2015-02-27 MED ORDER — ACETAMINOPHEN 325 MG PO TABS: 650.0000 mg | ORAL_TABLET | ORAL | Status: DC | PRN

## 2015-02-27 MED ORDER — MENTHOL 3 MG MT LOZG: 1.0000 | LOZENGE | OROMUCOSAL | Status: DC | PRN

## 2015-02-27 MED ORDER — SODIUM CHLORIDE 0.9 % IJ SOLN: 3.0000 mL | INTRAMUSCULAR | Status: DC | PRN

## 2015-02-27 MED ORDER — PHENYLEPHRINE HCL 10 MG/ML IJ SOLN
INTRAMUSCULAR | Status: DC | PRN
  Administered 2015-02-27: 80 ug via INTRAVENOUS

## 2015-02-27 MED ORDER — CEFAZOLIN SODIUM-DEXTROSE 2-3 GM-% IV SOLR
2.0000 g | Freq: Three times a day (TID) | INTRAVENOUS | Status: AC
  Administered 2015-02-27 (×2): 2 g via INTRAVENOUS
  Filled 2015-02-27 (×2): qty 50

## 2015-02-27 MED ORDER — OXYTOCIN 40 UNITS IN LACTATED RINGERS INFUSION - SIMPLE MED: 62.5000 mL/h | INTRAVENOUS | Status: AC

## 2015-02-27 MED ORDER — ONDANSETRON HCL 4 MG/2ML IJ SOLN
INTRAMUSCULAR | Status: DC | PRN
  Administered 2015-02-27: 4 mg via INTRAVENOUS

## 2015-02-27 MED ORDER — NALBUPHINE HCL 10 MG/ML IJ SOLN: 5.0000 mg | Freq: Once | INTRAMUSCULAR | Status: AC | PRN

## 2015-02-27 MED ORDER — SCOPOLAMINE 1 MG/3DAYS TD PT72
1.0000 | MEDICATED_PATCH | Freq: Once | TRANSDERMAL | Status: DC
  Administered 2015-02-27: 1.5 mg via TRANSDERMAL

## 2015-02-27 MED ORDER — EPHEDRINE SULFATE 50 MG/ML IJ SOLN
INTRAMUSCULAR | Status: DC | PRN
  Administered 2015-02-27: 5 mg via INTRAVENOUS

## 2015-02-27 MED ORDER — BUPIVACAINE IN DEXTROSE 0.75-8.25 % IT SOLN
INTRATHECAL | Status: DC | PRN
  Administered 2015-02-27: 1.4 mL via INTRATHECAL

## 2015-02-27 MED ORDER — 0.9 % SODIUM CHLORIDE (POUR BTL) OPTIME
TOPICAL | Status: DC | PRN
  Administered 2015-02-27: 1000 mL

## 2015-02-27 MED ORDER — CEFAZOLIN SODIUM-DEXTROSE 2-3 GM-% IV SOLR
2.0000 g | Freq: Once | INTRAVENOUS | Status: AC
  Administered 2015-02-27: 2 g via INTRAVENOUS
  Filled 2015-02-27: qty 50

## 2015-02-27 MED ORDER — MORPHINE SULFATE (PF) 0.5 MG/ML IJ SOLN
INTRAMUSCULAR | Status: DC | PRN
  Administered 2015-02-27: .2 mg via INTRATHECAL

## 2015-02-27 MED ORDER — LACTATED RINGERS IV SOLN: INTRAVENOUS | Status: DC

## 2015-02-27 MED ORDER — WITCH HAZEL-GLYCERIN EX PADS: 1.0000 "application " | MEDICATED_PAD | CUTANEOUS | Status: DC | PRN

## 2015-02-27 MED ORDER — TETANUS-DIPHTH-ACELL PERTUSSIS 5-2.5-18.5 LF-MCG/0.5 IM SUSP: 0.5000 mL | Freq: Once | INTRAMUSCULAR | Status: DC

## 2015-02-27 MED ORDER — HYDROMORPHONE HCL 1 MG/ML IJ SOLN
0.2500 mg | INTRAMUSCULAR | Status: DC | PRN
  Administered 2015-02-27 (×2): 0.5 mg via INTRAVENOUS

## 2015-02-27 SURGICAL SUPPLY — 33 items
CLAMP CORD UMBIL (MISCELLANEOUS) ×3 IMPLANT
CLOTH BEACON ORANGE TIMEOUT ST (SAFETY) ×3 IMPLANT
CONTAINER PREFILL 10% NBF 15ML (MISCELLANEOUS) IMPLANT
DRAPE SHEET LG 3/4 BI-LAMINATE (DRAPES) ×3 IMPLANT
DRSG OPSITE POSTOP 4X10 (GAUZE/BANDAGES/DRESSINGS) ×3 IMPLANT
DRSG VASELINE 3X18 (GAUZE/BANDAGES/DRESSINGS) ×3 IMPLANT
DURAPREP 26ML APPLICATOR (WOUND CARE) ×3 IMPLANT
ELECT REM PT RETURN 9FT ADLT (ELECTROSURGICAL) ×3
ELECTRODE REM PT RTRN 9FT ADLT (ELECTROSURGICAL) ×1 IMPLANT
EXTRACTOR VACUUM KIWI (MISCELLANEOUS) IMPLANT
EXTRACTOR VACUUM M CUP 4 TUBE (SUCTIONS) IMPLANT
EXTRACTOR VACUUM M CUP 4' TUBE (SUCTIONS)
GAUZE VASELINE 3X9 (GAUZE/BANDAGES/DRESSINGS) ×3 IMPLANT
GLOVE BIO SURGEON STRL SZ7.5 (GLOVE) ×3 IMPLANT
GOWN STRL REUS W/TWL LRG LVL3 (GOWN DISPOSABLE) ×6 IMPLANT
KIT ABG SYR 3ML LUER SLIP (SYRINGE) IMPLANT
NEEDLE HYPO 25X5/8 SAFETYGLIDE (NEEDLE) IMPLANT
NS IRRIG 1000ML POUR BTL (IV SOLUTION) ×3 IMPLANT
PACK C SECTION WH (CUSTOM PROCEDURE TRAY) ×3 IMPLANT
PAD ABD 8X7 1/2 STERILE (GAUZE/BANDAGES/DRESSINGS) ×3 IMPLANT
PAD OB MATERNITY 4.3X12.25 (PERSONAL CARE ITEMS) ×3 IMPLANT
RTRCTR C-SECT PINK 25CM LRG (MISCELLANEOUS) ×3 IMPLANT
SPONGE GAUZE 4X4 12PLY STER LF (GAUZE/BANDAGES/DRESSINGS) ×3 IMPLANT
STAPLER VISISTAT 35W (STAPLE) ×3 IMPLANT
SUT PLAIN 0 NONE (SUTURE) IMPLANT
SUT VIC AB 0 CT1 36 (SUTURE) ×24 IMPLANT
SUT VIC AB 3-0 CTX 36 (SUTURE) ×3 IMPLANT
SUT VIC AB 3-0 SH 27 (SUTURE)
SUT VIC AB 3-0 SH 27X BRD (SUTURE) IMPLANT
SUT VIC AB 4-0 KS 27 (SUTURE) IMPLANT
SUT VICRYL 0 TIES 12 18 (SUTURE) IMPLANT
TOWEL OR 17X24 6PK STRL BLUE (TOWEL DISPOSABLE) ×3 IMPLANT
TRAY FOLEY CATH SILVER 14FR (SET/KITS/TRAYS/PACK) ×3 IMPLANT

## 2015-02-27 NOTE — Addendum Note (Signed)
Addendum  created 02/27/15 1408 by Jonna Munro, CRNA   Modules edited: Notes Section   Notes Section:  File: [id number]

## 2015-02-27 NOTE — H&P (Signed)
Cynthia [name], [name]             ACCOUNT NO.:  [id number]  MEDICAL RECORD NO.:  [id number]  LOCATION:  WH09                          FACILITY:  [facility]  PHYSICIAN:  Lucille Passy. Ulanda Edison, M.D. DATE OF BIRTH:  [birth year]  DATE OF ADMISSION:  02/26/2015 DATE OF DISCHARGE:                             HISTORY & PHYSICAL   PRESENT ILLNESS:  This is a 34 year old black female, para 6-0-0-6, gravida 7 at 40-4/7th weeks' gestation with [facility] of February 23, 2015, admitted for repeat C-section, having contractions.  Blood group and type A positive, negative antibody, hepatitis B surface antigen negative, varicella immune, rubella immune.  Screening tests were not done because she entered prenatal care late.  Group B strep was negative.  Repeat HIV and RPR were negative.  One-hour Glucola was 133. GC and chlamydia were negative.  Group B strep negative.  The patient began her prenatal course late in pregnancy.  Ultrasound was performed on November 08, 2014, at 24 weeks and 5 days, gave a due date of February 23, 2015.  Because she was late to care, I did not set her desired C- section up until I felt sure the baby would not be preterm or early term.  After she began her prenatal course, she had no particular problems.  She declined a VBAC.  She wanted to be scheduled for C- section, so I had scheduled the C-section on February 28, 2015.  She did not want tubal ligation.  She came to the Maternity Admission Unit with contractions and her cervix reached 4 cm dilatation.  She had been evaluated with labs and urine for preeclampsia and even though her blood pressure was minimally elevated, she did show 417.6 mg of protein in the urine in 24 hours.  The highest blood pressure was 130/90, so I think it is correct to say she has preeclampsia.  Her blood labs showed no abnormalities of her liver function, platelet count 137.  PAST MEDICAL HISTORY:  She has had PIH x1 in the fourth pregnancy.  She has no other major  medical illnesses.  SOCIAL HISTORY:  The patient smokes every day.  She does not drink. Denies illicit drugs.  She has a 12th grade education.  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  PAST SURGICAL HISTORY:  Two C-sections in 2007 and 2012 followed by vaginal birth, but the patient does not want a vaginal birth with this pregnancy.  She has had 4 vaginal deliveries and 2 C-sections.  ALLERGIES:  She has no known drug allergies.  No latex or food allergies.  PHYSICAL EXAMINATION:  VITAL SIGNS:  Her temperature was 98.1, pulse 60, respirations 18, blood pressure 140/78. HEART:  Normal size and sounds. LUNGS:  Clear to auscultation. ABDOMEN:  Fundal height at her last prenatal visit on February 26, 2015, was 42 cm.  Fetal heart tones are normal.  Per the RN in the MAU, her cervix was 4 cm, 60%, posterior vertex presentation.  ADMITTING IMPRESSION:  Intrauterine pregnancy at 40 plus weeks' gestation, 2 prior cesarian sections, contractions, probable labor, probable preeclampsia.  The patient was admitted for repeat cesarean section.  She declines tubal ligation.     Lucille Passy. Ulanda Edison, M.D.  TFH/MEDQ  D:  02/27/2015  T:  02/27/2015  Job:  [id number]

## 2015-02-27 NOTE — Anesthesia Preprocedure Evaluation (Signed)
Anesthesia Evaluation  Patient identified by MRN, date of birth, ID band Patient awake    Reviewed: Allergy & Precautions, H&P , NPO status , Patient's Chart, lab work & pertinent test results  Airway Mallampati: I  TM Distance: >3 FB Neck ROM: full    Dental no notable dental hx.    Pulmonary Current Smoker,    Pulmonary exam normal       Cardiovascular negative cardio ROS Normal cardiovascular exam    Neuro/Psych negative neurological ROS  negative psych ROS   GI/Hepatic negative GI ROS, Neg liver ROS,   Endo/Other  negative endocrine ROS  Renal/GU negative Renal ROS     Musculoskeletal   Abdominal Normal abdominal exam  (+)   Peds  Hematology negative hematology ROS (+)   Anesthesia Other Findings   Reproductive/Obstetrics (+) Pregnancy                             Anesthesia Physical Anesthesia Plan  ASA: II  Anesthesia Plan: Spinal   Post-op Pain Management:    Induction:   Airway Management Planned:   Additional Equipment:   Intra-op Plan:   Post-operative Plan:   Informed Consent: I have reviewed the patients History and Physical, chart, labs and discussed the procedure including the risks, benefits and alternatives for the proposed anesthesia with the patient or authorized representative who has indicated his/her understanding and acceptance.     Plan Discussed with: CRNA and Surgeon  Anesthesia Plan Comments:         Anesthesia Quick Evaluation

## 2015-02-27 NOTE — Anesthesia Postprocedure Evaluation (Signed)
  Anesthesia Post-op Note  Patient: Cynthia [name]  Procedure(s) Performed: Procedure(s): CESAREAN SECTION (N/A)  Patient Location: Mother/Baby  Anesthesia Type:Spinal  Level of Consciousness: awake, alert  and oriented  Airway and Oxygen Therapy: Patient Spontanous Breathing  Post-op Pain: none  Post-op Assessment: Post-op Vital signs reviewed, Patient's Cardiovascular Status Stable, Respiratory Function Stable, No signs of Nausea or vomiting, No headache and No backache      Post-op Vital Signs: Reviewed and stable  Last Vitals:  Filed Vitals:   02/27/15 0715  BP: 140/81  Pulse: 68  Temp: 36.4 C  Resp: 20    Complications: No apparent anesthesia complications

## 2015-02-27 NOTE — Anesthesia Postprocedure Evaluation (Signed)
Anesthesia Post Note  Patient: Cynthia [name]  Procedure(s) Performed: Procedure(s) (LRB): CESAREAN SECTION (N/A)  Anesthesia type: Spinal  Patient location: PACU  Post pain: Pain level controlled  Post assessment: Post-op Vital signs reviewed  Last Vitals:  Filed Vitals:   02/27/15 0430  BP: 123/65  Pulse: 72  Temp:   Resp: 29    Post vital signs: Reviewed  Level of consciousness: awake  Complications: No apparent anesthesia complications

## 2015-02-27 NOTE — Progress Notes (Signed)
Patient ID: Cynthia [name], female   DOB: 10-10-1980, 35 y.o.   MRN: [id number] DOS VS normal no complaints Output good. BP normal so no need for magnesium sulfate at this time.

## 2015-02-27 NOTE — Op Note (Signed)
Cynthia [name], Cynthia [name]             ACCOUNT NO.:  [id number]  MEDICAL RECORD NO.:  [id number]  LOCATION:  [facility]                          FACILITY:  [facility]  PHYSICIAN:  Lucille Passy. Ulanda Edison, M.D. DATE OF BIRTH:  [birth year]  DATE OF PROCEDURE:  02/27/2015 DATE OF DISCHARGE:                              OPERATIVE REPORT   PREOPERATIVE DIAGNOSES:  Intrauterine pregnancy at 40 weeks 4 days, prior C-section x2, early labor, probable preeclampsia.  POSTOPERATIVE DIAGNOSES:  Intrauterine pregnancy at 40 weeks 4 days, prior C-section x2, early labor, probable preeclampsia.  OPERATION:  Low-transverse cervical C-section.  OPERATOR:  Lucille Passy. Ulanda Edison, M.D.  ANESTHESIA:  Spinal anesthesia by Dr. Jillyn Hidden.  DESCRIPTION OF PROCEDURE:  The patient was brought to the operating room and given a spinal anesthetic by Dr. Jillyn Hidden.  She was placed supine, tilted to the left.  The urethra was prepped with Betadine solution. The bladder was emptied with a Foley catheter and hooked to a straight drain.  The abdomen was then prepped with DuraPrep, and after a 3-minute delay, watching it dry and a time-out being done, the area was draped as a sterile field.  The patient's 2 prior C-sections were done right on top of the pubic bone, I do my incisions 2 fingerbreadths above the pubis, so I made a new incision transversely 2 fingerbreadths above the pubis.  The patient had thin subcutaneous tissue, went down to the fascia easily, separated subcutaneous tissue from the fascia, incised the fascia transversely, separated through the fascia from the rectus muscles superiorly and inferiorly.  Entered the peritoneal cavity in the midline and then enlarged the incision, and placed an Alexis retractor making sure there was no bowel entrapped and there were no adhesions from the 2 prior C-sections.  The lower uterine segment was exposed.  I made a transverse incision through the superficial layers of the myometrium, and  then entered the amniotic sac with my finger where clear fluid was obtained.  I enlarged the incision by pulling superiorly and inferiorly, delivered the vertex through the incisional opening with fundal pressure, suctioned the baby's nose and mouth and then delivered the rest of the baby.  There was 1 loop of nuchal cord.  The cord was clamped.  The infant was given to the Neonatal team who was in attendance.  The baby cried vigorously as soon as it was on via maternal abdomen, but I do not have the official Apgars.  Cord blood was obtained.  The placenta was removed.  The inside of the uterus was inspected and found to be free of any debris and the uterine incision was closed with 2 running sutures of 0 Vicryl not locking the 1st layer and nonlocking on the 2nd.  Hemostasis was complete.  Liberal irrigation confirmed that.  I inspected both tubes and ovaries, they were both normal.  The uterus itself appeared normal.  The Alexis retractor was removed and the abdominal wall was closed in layers using interrupted sutures of 0 Vicryl to close the peritoneum and rectus muscle with 1 layer, 2 running sutures of 0 Vicryl on the fascia, running 3-0 Vicryl on the subcutaneous tissue, and staples on the skin.  The patient  seemed to tolerate the procedure well.  Blood loss was estimated at 600 mL. Sponge and needle counts were correct and she was returned to recovery in satisfactory condition.     Lucille Passy. Ulanda Edison, M.D.     TFH/MEDQ  D:  02/27/2015  T:  02/27/2015  Job:  [id number]

## 2015-02-27 NOTE — Anesthesia Procedure Notes (Signed)
Spinal Patient location during procedure: OR Start time: 02/27/2015 2:58 AM End time: 02/27/2015 3:01 AM Staffing Anesthesiologist: Lyn Hollingshead Performed by: anesthesiologist  Preanesthetic Checklist Completed: patient identified, surgical consent, pre-op evaluation, timeout performed, IV checked, risks and benefits discussed and monitors and equipment checked Spinal Block Patient position: sitting Prep: site prepped and draped and DuraPrep Patient monitoring: heart rate, cardiac monitor, continuous pulse ox and blood pressure Approach: midline Location: L3-4 Injection technique: single-shot Needle Needle type: Pencan  Needle gauge: 24 G Needle length: 9 cm Needle insertion depth: 4 cm Assessment Sensory level: T4

## 2015-02-27 NOTE — Transfer of Care (Signed)
22Immediate Anesthesia Transfer of Care Note  Patient: Cynthia [name]  Procedure(s) Performed: Procedure(s): CESAREAN SECTION (N/A)  Patient Location: PACU  Anesthesia Type:Spinal  Level of Consciousness: awake, alert  and oriented  Airway & Oxygen Therapy: Patient Spontanous Breathing  Post-op Assessment: Report given to RN and Post -op Vital signs reviewed and stable  Post vital signs: Reviewed and stable  Last Vitals:  Filed Vitals:   02/27/15 0126  BP: 140/78  Pulse: 60  Temp:   Resp:     Complications: No apparent anesthesia complications

## 2015-02-27 NOTE — H&P (Signed)
This is a BF para 6006 gr 7 admitted in labor. She is scheduled for a repeat c section 02-28-15 but began labor. Her prenatal course has been uncomplicated except for late entry into prenatal care at 24 weeks. A+ Hep B negative rubella and varicella + HIV,RPR and GC/CT negative.    PMH: No adult illnesses Operations c section 2007 and 2012 Allergies : None Tobacco + Alcohol and drugs : None Stay at home mom   PE: T98.1 P 60 R 18 BP 140/78   Heart NSR no murmurs Lungs Clear to auscultation. Abdomen Soft FH 42 cm FHT's normal Cervix per the RN 4 cm 60 % effaced and vertex at - 2 station Cervix posterior                   Impression: IUP 40 weeks 4 days prior c section x 2 declined VBAC                  Disp : Pt is prepared for c section

## 2015-02-28 ENCOUNTER — Encounter (HOSPITAL_COMMUNITY): Admission: RE | Payer: Self-pay | Source: Ambulatory Visit

## 2015-02-28 ENCOUNTER — Encounter (HOSPITAL_COMMUNITY): Payer: Self-pay | Admitting: Obstetrics and Gynecology

## 2015-02-28 ENCOUNTER — Inpatient Hospital Stay (HOSPITAL_COMMUNITY)
Admission: RE | Admit: 2015-02-28 | Payer: Medicaid Other | Source: Ambulatory Visit | Admitting: Obstetrics and Gynecology

## 2015-02-28 LAB — CBC
HCT: 27.6 % — ABNORMAL LOW (ref 36.0–46.0)
Hemoglobin: 9.4 g/dL — ABNORMAL LOW (ref 12.0–15.0)
MCH: 29.2 pg (ref 26.0–34.0)
MCHC: 34.1 g/dL (ref 30.0–36.0)
MCV: 85.7 fL (ref 78.0–100.0)
Platelets: 140 10*3/uL — ABNORMAL LOW (ref 150–400)
RBC: 3.22 MIL/uL — ABNORMAL LOW (ref 3.87–5.11)
RDW: 15.1 % (ref 11.5–15.5)
WBC: 8 10*3/uL (ref 4.0–10.5)

## 2015-02-28 SURGERY — Surgical Case
Anesthesia: Regional

## 2015-02-28 NOTE — Progress Notes (Signed)
Patient ID: Cynthia [name], female   DOB: 1981/08/09, 34 y.o.   MRN: [id number] #1 afebrile BP normal Tolerating a diet and has passed flatus  +

## 2015-03-01 MED ORDER — OXYCODONE-ACETAMINOPHEN 5-325 MG PO TABS: 1.0000 | ORAL_TABLET | Freq: Four times a day (QID) | ORAL | Status: DC | PRN

## 2015-03-01 MED ORDER — IBUPROFEN 600 MG PO TABS: 600.0000 mg | ORAL_TABLET | Freq: Four times a day (QID) | ORAL | Status: DC | PRN

## 2015-03-01 MED ORDER — FERROUS SULFATE 325 (65 FE) MG PO TABS: 325.0000 mg | ORAL_TABLET | Freq: Two times a day (BID) | ORAL | Status: DC

## 2015-03-01 NOTE — Discharge Instructions (Signed)
booklet °

## 2015-03-01 NOTE — Progress Notes (Signed)
Patient ID: Cynthia [name], female   DOB: May 12, 1981, 34 y.o.   MRN: [id number] #2 afebrile BP normal She is tolerating a diet, passing flatus, ambulating, voiding well and she has no complaints so she is ready for discharge

## 2015-03-02 NOTE — Discharge Summary (Signed)
Cynthia [name], Cynthia [name]             ACCOUNT NO.:  [id number]  MEDICAL RECORD NO.:  [id number]  LOCATION:  9131                          FACILITY:  [facility]  PHYSICIAN:  Lucille Passy. Ulanda Edison, M.D. DATE OF BIRTH:  [birth year]  DATE OF ADMISSION:  02/26/2015 DATE OF DISCHARGE:  03/01/2015                              DISCHARGE SUMMARY   HISTORY OF PRESENT ILLNESS:  A 35-old black female, para 6-0-0-6, gravida 7 at 2 and 4/7th weeks gestation with [facility] of February 23, 2015, admitted for repeat C-section.  She actually was having contractions so I scheduled the C-section for the following day but went ahead and did it on the day of admission because she was in labor.  She underwent a low-transverse cervical C-section by Dr. Ulanda Edison under spinal anesthesia by Dr. Jillyn Hidden.  The baby was a living female infant,  Apgars of 8 and 9, and 8 pounds 4 ounces.  No adhesions were encountered.  Postoperatively, the patient did quite well and was discharged on the second postop day. She was ambulating well, tolerating a regular diet, passing flatus, voiding well, and was ready for discharge.  Her hemoglobin on admission was 10.3, hematocrit 30.5, platelet count 137,000, white count 5600. First postop day, hemoglobin was 9.4, hematocrit 27.6, platelet count 140,000, white count 8000.  The patient's incision was closed with staples at her request.  I have advised to come back in 5 days to have the staples removed.  FINAL DIAGNOSIS:  Intrauterine pregnancy at 40+ weeks delivered by repeat C-section, two prior C-sections.  OPERATION:  Low-transverse cervical C-section.  The patient declined tubal ligation.  DISCHARGE MEDICATIONS:  She is advised to take ferrous sulfate 325 mg twice daily, Percocet 5/325, 30 tablets, 1 every 6 hours as needed for pain, and Motrin 600 mg 30 tablets, 1 every 6 hours as needed for pain.  She is to return to the office in 5 days to have her staples removed] and call with any problems.   She was given discharge instruction booklet in the after visit summary.     Lucille Passy. Ulanda Edison, M.D.     TFH/MEDQ  D:  03/01/2015  T:  03/02/2015  Job:  [id number]

## 2015-05-25 ENCOUNTER — Encounter (HOSPITAL_COMMUNITY): Payer: Self-pay | Admitting: Emergency Medicine

## 2015-05-25 ENCOUNTER — Inpatient Hospital Stay (HOSPITAL_COMMUNITY)
Admission: EM | Admit: 2015-05-25 | Discharge: 2015-05-28 | DRG: 176 | Disposition: A | Discharge status: Home / Self Care | Payer: Medicaid Other | Attending: Internal Medicine | Admitting: Internal Medicine

## 2015-05-25 ENCOUNTER — Emergency Department (HOSPITAL_COMMUNITY): Payer: Medicaid Other

## 2015-05-25 ENCOUNTER — Inpatient Hospital Stay (HOSPITAL_COMMUNITY): Payer: Medicaid Other

## 2015-05-25 DIAGNOSIS — I2699 Other pulmonary embolism without acute cor pulmonale: Secondary | ICD-10-CM | POA: Diagnosis present

## 2015-05-25 DIAGNOSIS — Z720 Tobacco use: Secondary | ICD-10-CM

## 2015-05-25 DIAGNOSIS — F17210 Nicotine dependence, cigarettes, uncomplicated: Secondary | ICD-10-CM | POA: Diagnosis present

## 2015-05-25 DIAGNOSIS — Z8249 Family history of ischemic heart disease and other diseases of the circulatory system: Secondary | ICD-10-CM

## 2015-05-25 DIAGNOSIS — R091 Pleurisy: Secondary | ICD-10-CM | POA: Diagnosis present

## 2015-05-25 DIAGNOSIS — R079 Chest pain, unspecified: Secondary | ICD-10-CM | POA: Diagnosis present

## 2015-05-25 DIAGNOSIS — R001 Bradycardia, unspecified: Secondary | ICD-10-CM | POA: Diagnosis not present

## 2015-05-25 DIAGNOSIS — Z9889 Other specified postprocedural states: Secondary | ICD-10-CM | POA: Diagnosis not present

## 2015-05-25 HISTORY — DX: Tobacco use: Z72.0

## 2015-05-25 LAB — URINALYSIS, ROUTINE W REFLEX MICROSCOPIC
BILIRUBIN URINE: NEGATIVE
Glucose, UA: NEGATIVE mg/dL
Hgb urine dipstick: NEGATIVE
Ketones, ur: NEGATIVE mg/dL
Leukocytes, UA: NEGATIVE
NITRITE: NEGATIVE
PH: 6 (ref 5.0–8.0)
Protein, ur: >300 mg/dL — AB
Urobilinogen, UA: 1 mg/dL (ref 0.0–1.0)

## 2015-05-25 LAB — CBC
HEMATOCRIT: 38.4 % (ref 36.0–46.0)
HEMATOCRIT: 35.7 % — AB (ref 36.0–46.0)
Hemoglobin: 13.1 g/dL (ref 12.0–15.0)
Hemoglobin: 11.8 g/dL — ABNORMAL LOW (ref 12.0–15.0)
MCH: 29.8 pg (ref 26.0–34.0)
MCH: 28.9 pg (ref 26.0–34.0)
MCHC: 34.1 g/dL (ref 30.0–36.0)
MCHC: 33.1 g/dL (ref 30.0–36.0)
MCV: 87.5 fL (ref 78.0–100.0)
MCV: 87.5 fL (ref 78.0–100.0)
PLATELETS: 274 10*3/uL (ref 150–400)
Platelets: 284 10*3/uL (ref 150–400)
RBC: 4.39 MIL/uL (ref 3.87–5.11)
RBC: 4.08 MIL/uL (ref 3.87–5.11)
RDW: 13.1 % (ref 11.5–15.5)
RDW: 13.3 % (ref 11.5–15.5)
WBC: 7.8 10*3/uL (ref 4.0–10.5)
WBC: 10.9 10*3/uL — ABNORMAL HIGH (ref 4.0–10.5)

## 2015-05-25 LAB — RAPID URINE DRUG SCREEN, HOSP PERFORMED
Amphetamines: NOT DETECTED
BARBITURATES: NOT DETECTED
Benzodiazepines: NOT DETECTED
COCAINE: NOT DETECTED
Opiates: POSITIVE — AB
Tetrahydrocannabinol: NOT DETECTED

## 2015-05-25 LAB — COMPREHENSIVE METABOLIC PANEL
ALBUMIN: 3.5 g/dL (ref 3.5–5.0)
ALT: 9 U/L — ABNORMAL LOW (ref 14–54)
AST: 16 U/L (ref 15–41)
Alkaline Phosphatase: 67 U/L (ref 38–126)
Anion gap: 7 (ref 5–15)
BUN: 10 mg/dL (ref 6–20)
CHLORIDE: 106 mmol/L (ref 101–111)
CO2: 25 mmol/L (ref 22–32)
Calcium: 8.9 mg/dL (ref 8.9–10.3)
Creatinine, Ser: 0.78 mg/dL (ref 0.44–1.00)
GFR calc Af Amer: >60 mL/min (ref >60)
GFR calc non Af Amer: >60 mL/min (ref >60)
Glucose, Bld: 143 mg/dL — ABNORMAL HIGH (ref 65–99)
POTASSIUM: 4.3 mmol/L (ref 3.5–5.1)
SODIUM: 138 mmol/L (ref 135–145)
Total Bilirubin: 0.6 mg/dL (ref 0.3–1.2)
Total Protein: 7.2 g/dL (ref 6.5–8.1)

## 2015-05-25 LAB — BASIC METABOLIC PANEL
ANION GAP: 7 (ref 5–15)
BUN: 8 mg/dL (ref 6–20)
CALCIUM: 8.3 mg/dL — AB (ref 8.9–10.3)
CO2: 23 mmol/L (ref 22–32)
Chloride: 108 mmol/L (ref 101–111)
Creatinine, Ser: 0.67 mg/dL (ref 0.44–1.00)
Glucose, Bld: 129 mg/dL — ABNORMAL HIGH (ref 65–99)
Potassium: 4 mmol/L (ref 3.5–5.1)
SODIUM: 138 mmol/L (ref 135–145)

## 2015-05-25 LAB — I-STAT BETA HCG BLOOD, ED (MC, WL, AP ONLY): I-stat hCG, quantitative: <5 m[IU]/mL (ref <5)

## 2015-05-25 LAB — D-DIMER, QUANTITATIVE: D-Dimer, Quant: 1.82 ug/mL-FEU — ABNORMAL HIGH (ref 0.00–0.48)

## 2015-05-25 LAB — HEPARIN LEVEL (UNFRACTIONATED)
HEPARIN UNFRACTIONATED: 0.12 [IU]/mL — AB (ref 0.30–0.70)
HEPARIN UNFRACTIONATED: 0.35 [IU]/mL (ref 0.30–0.70)
Heparin Unfractionated: 0.37 IU/mL (ref 0.30–0.70)

## 2015-05-25 LAB — PROTIME-INR
INR: 0.89 (ref 0.00–1.49)
PROTHROMBIN TIME: 12.3 s (ref 11.6–15.2)

## 2015-05-25 LAB — URINE MICROSCOPIC-ADD ON

## 2015-05-25 LAB — C-REACTIVE PROTEIN: CRP: 2.6 mg/dL — ABNORMAL HIGH (ref <1.0)

## 2015-05-25 LAB — GLUCOSE, CAPILLARY: Glucose-Capillary: 137 mg/dL — ABNORMAL HIGH (ref 65–99)

## 2015-05-25 LAB — SEDIMENTATION RATE: SED RATE: 40 mm/h — AB (ref 0–22)

## 2015-05-25 LAB — ANTITHROMBIN III: AntiThromb III Func: 104 % (ref 75–120)

## 2015-05-25 LAB — APTT: aPTT: 31 seconds (ref 24–37)

## 2015-05-25 LAB — HIV ANTIBODY (ROUTINE TESTING W REFLEX): HIV SCREEN 4TH GENERATION: NONREACTIVE

## 2015-05-25 LAB — LIPASE, BLOOD: Lipase: 16 U/L — ABNORMAL LOW (ref 22–51)

## 2015-05-25 MED ORDER — MORPHINE SULFATE (PF) 2 MG/ML IV SOLN
2.0000 mg | INTRAVENOUS | Status: DC | PRN
  Administered 2015-05-25 (×2): 2 mg via INTRAVENOUS
  Filled 2015-05-25 (×2): qty 1

## 2015-05-25 MED ORDER — HYDROMORPHONE HCL 1 MG/ML IJ SOLN
1.0000 mg | Freq: Once | INTRAMUSCULAR | Status: AC
  Administered 2015-05-25: 1 mg via INTRAVENOUS
  Filled 2015-05-25: qty 1

## 2015-05-25 MED ORDER — ALUM & MAG HYDROXIDE-SIMETH 200-200-20 MG/5ML PO SUSP: 30.0000 mL | Freq: Four times a day (QID) | ORAL | Status: DC | PRN

## 2015-05-25 MED ORDER — HEPARIN (PORCINE) IN NACL 100-0.45 UNIT/ML-% IJ SOLN
1250.0000 [IU]/h | INTRAMUSCULAR | Status: AC
  Administered 2015-05-26: 1250 [IU]/h via INTRAVENOUS
  Filled 2015-05-25 (×6): qty 250

## 2015-05-25 MED ORDER — SODIUM CHLORIDE 0.9 % IV SOLN
Freq: Once | INTRAVENOUS | Status: AC
  Administered 2015-05-25: 01:00:00 via INTRAVENOUS

## 2015-05-25 MED ORDER — OXYCODONE-ACETAMINOPHEN 5-325 MG PO TABS: 2.0000 | ORAL_TABLET | Freq: Four times a day (QID) | ORAL | Status: DC | PRN

## 2015-05-25 MED ORDER — ONDANSETRON HCL 4 MG/2ML IJ SOLN
4.0000 mg | Freq: Once | INTRAMUSCULAR | Status: AC
  Administered 2015-05-25: 4 mg via INTRAVENOUS
  Filled 2015-05-25: qty 2

## 2015-05-25 MED ORDER — HEPARIN BOLUS VIA INFUSION
3000.0000 [IU] | Freq: Once | INTRAVENOUS | Status: AC
  Administered 2015-05-25: 3000 [IU] via INTRAVENOUS
  Filled 2015-05-25: qty 3000

## 2015-05-25 MED ORDER — TRAMADOL-ACETAMINOPHEN 37.5-325 MG PO TABS
2.0000 | ORAL_TABLET | ORAL | Status: DC | PRN
  Administered 2015-05-26: 2 via ORAL
  Filled 2015-05-25: qty 2

## 2015-05-25 MED ORDER — NICOTINE 14 MG/24HR TD PT24
14.0000 mg | MEDICATED_PATCH | Freq: Every day | TRANSDERMAL | Status: DC
  Administered 2015-05-26 – 2015-05-28 (×3): 14 mg via TRANSDERMAL
  Filled 2015-05-25 (×3): qty 1

## 2015-05-25 MED ORDER — SODIUM CHLORIDE 0.9 % IJ SOLN
3.0000 mL | Freq: Two times a day (BID) | INTRAMUSCULAR | Status: DC
Start: 2015-05-25 — End: 2015-05-28
  Administered 2015-05-25 – 2015-05-27 (×2): 3 mL via INTRAVENOUS

## 2015-05-25 MED ORDER — ONDANSETRON HCL 4 MG/2ML IJ SOLN: 4.0000 mg | Freq: Three times a day (TID) | INTRAMUSCULAR | Status: DC | PRN

## 2015-05-25 MED ORDER — SODIUM CHLORIDE 0.9 % IV SOLN
Freq: Once | INTRAVENOUS | Status: AC
  Administered 2015-05-25: 07:00:00 via INTRAVENOUS

## 2015-05-25 MED ORDER — ACETAMINOPHEN 325 MG PO TABS: 650.0000 mg | ORAL_TABLET | Freq: Four times a day (QID) | ORAL | Status: DC | PRN

## 2015-05-25 MED ORDER — IOHEXOL 350 MG/ML SOLN
100.0000 mL | Freq: Once | INTRAVENOUS | Status: AC | PRN
  Administered 2015-05-25: 100 mL via INTRAVENOUS

## 2015-05-25 MED ORDER — KETOROLAC TROMETHAMINE 30 MG/ML IJ SOLN
30.0000 mg | Freq: Four times a day (QID) | INTRAMUSCULAR | Status: DC | PRN
  Administered 2015-05-25: 30 mg via INTRAVENOUS
  Filled 2015-05-25: qty 1

## 2015-05-25 MED ORDER — NICOTINE 21 MG/24HR TD PT24
21.0000 mg | MEDICATED_PATCH | Freq: Every day | TRANSDERMAL | Status: DC
  Administered 2015-05-25: 21 mg via TRANSDERMAL
  Filled 2015-05-25: qty 1

## 2015-05-25 MED ORDER — ACETAMINOPHEN 650 MG RE SUPP: 650.0000 mg | Freq: Four times a day (QID) | RECTAL | Status: DC | PRN

## 2015-05-25 NOTE — ED Notes (Signed)
Brought in by EMS from home with c/o abdominal pain, onset yesterday morning.  Pt reports RUQ abdominal pain that has progressed to its "worst" an hour ago.  Pt denies nausea or vomiting or diarrhea; denies dysuria.  Pt denies trauma or injury.  Hx of cesarean section 3 months ago.

## 2015-05-25 NOTE — Progress Notes (Addendum)
ANTICOAGULATION CONSULT NOTE - Follow Up Consult  Pharmacy Consult for heparin  Indication: new pulmonary embolus  No Known Allergies  Patient Measurements: Height: 5\' 5"  (165.1 cm) Weight: 147 lb 1.6 oz (66.724 kg) IBW/kg (Calculated) : 57 Heparin Dosing Weight: 67 kg  Vital Signs: Temp: 97.9 F (36.6 C) (09/10 0428) Temp Source: Oral (09/10 0428) BP: 125/71 mmHg (09/10 0428) Pulse Rate: 69 (09/10 0428)  Labs:  Recent Labs  05/25/15 0105 05/25/15 0107 05/25/15 0550 05/25/15 1206  HGB  --  13.1 11.8*  --   HCT  --  38.4 35.7*  --   PLT  --  284 274  --   APTT 31  --   --   --   LABPROT 12.3  --   --   --   INR 0.89  --   --   --   HEPARINUNFRC  --   --  0.12* 0.37  CREATININE  --  0.78 0.67  --     Estimated Creatinine Clearance: 91.7 mL/min (by C-G formula based on Cr of 0.67).  Assessment: Patient's a 34 y.o F s/p c-section three months ago who presented to the ED on 9/10 with c/o CP. Chest CTA showed new BL PE.  LE doppler negative for DVT.  She's currently on heparin for thrombus.  Today, 05/25/2015: - heparin level ow back therapeutic at 0.37 - hgb down slightly 11.8, plt ok - no bleeding documented - hypercoagulable panel pending    Goal of Therapy:  Heparin level 0.3-0.7 units/ml Monitor platelets by anticoagulation protocol: Yes   Plan:  - continue heparin drip at 1100 units/hr - will recheck another level at 6PM tonight to confirm before changing to daily heparin level monitoring  Lamel Mccarley P 05/25/2015,12:31 PM  Adden: 6PM heparin level now back therapeutic at 0.35. Continue current rate of 1100 units/hr. Dia Sitter, PharmD, BCPS 05/25/2015 7:37 PM

## 2015-05-25 NOTE — Progress Notes (Signed)
PT Cancellation Note  Patient Details Name: Cynthia [name] MRN: [id number] DOB: 15-Mar-1981   Cancelled Treatment:    Reason Eval/Treat Not Completed: PT screened, no needs identified, will sign off.  Please re-order if pt's functional level changes.   Kaizen Ibsen LUBECK 05/25/2015, 3:54 PM

## 2015-05-25 NOTE — ED Notes (Signed)
Pt cannot use restroom at this time, aware specimen is needed. 

## 2015-05-25 NOTE — Progress Notes (Signed)
ANTICOAGULATION CONSULT NOTE - Initial Consult  Pharmacy Consult for Heparin Indication: pulmonary embolus  No Known Allergies  Patient Measurements:   Last documented weight = 85.4 kg (02/26/15)  Vital Signs: BP: 126/79 mmHg (09/10 0251) Pulse Rate: 77 (09/10 0251)  Labs:  Recent Labs  05/25/15 0107  HGB 13.1  HCT 38.4  PLT 284  CREATININE 0.78    CrCl cannot be calculated (Unknown ideal weight.).   Medical History: Past Medical History  Diagnosis Date  . Medical history non-contributory     Medications:  Scheduled:  . heparin  3,000 Units Intravenous Once   Infusions:  . heparin      Assessment:  34 yr female 3 months s/p C-section presents with RUQ abdominal pain  CTAngio shows + Pulmonary Embolism  Pharmacy consulted to begin dosing IV heparin for treatment  Goal of Therapy:  Heparin level 0.3-0.7 units/ml Monitor platelets by anticoagulation protocol: Yes   Plan:   Obtain baseline aPTT and PT/INR  Heparin 3000 unit IV bolus x 1 then infusion @ 1100 units/hr  Check heparin level 6 hr after heparin started  Follow daily heparin level & CBC  F/U current weight, once documented in [place], Toribio Harbour, PharmD 05/25/2015,3:37 AM

## 2015-05-25 NOTE — H&P (Signed)
[facility] History and Physical  KYIA RHUDE WJX:[id number] DOB: 1980/12/15 DOA: 05/25/2015  Referring physician: ED physician PCP: No PCP Per Patient  Specialists:   Chief Complaint: Chest pain  HPI: Cynthia [name] is a 34 y.o. female with PMH of tobacco abuse, gave birth 3 months ago via C-section, currently taking OCP, who presents with chest pain.  Pt reports that this morning when she woke up she had pain over right lower chest. The pain was constant and moderate. Since about 9 PM, the pain has gotten much worse, radiating to her right neck. She states that she is taking oral contraceptives. No recent long distance traveling, no tenderness over calf areas. She has mild shortness of breath, but no cough, fever, chills, abdominal pain, nausea, vomiting, diarrhea, symptoms of UTI, unilateral weakness. No family history of blood clot. She is not currently nursing her child and does not have a history of blood disease.   In ED, patient was found to have pulmonary embolism by CTA, which showed filling defects in the distal right main pulmonary artery extending into upper and lower lobe branches consistent with acute pulmonary embolus. Small emboli in the left lower lobe pulmonary arteries. Patchy areas of consolidation in the right lung may represent areas of infarct. WBC 7.8, temperature normal, no tachycardia, electrolytes okay, lipase 16, pregnancy test negative. Patient is admitted to inpatient for further management treatment.   Where does patient live?   At home  Can patient participate in ADLs?  Yes    Review of Systems:   General: no fevers, chills, no changes in body weight, has poor appetite, has fatigue HEENT: no blurry vision, hearing changes or sore throat Pulm: has mild dyspnea, no coughing, wheezing CV: has chest pain, no palpitations Abd: no nausea, vomiting, abdominal pain, diarrhea, constipation GU: no dysuria, burning on urination, increased urinary  frequency, hematuria  Ext: no leg edema Neuro: no unilateral weakness, numbness, or tingling, no vision change or hearing loss Skin: no rash MSK: No muscle spasm, no deformity, no limitation of range of movement in spin Heme: No easy bruising.  Travel history: No recent long distant travel.  Allergy: No Known Allergies  Past Medical History  Diagnosis Date  . Tobacco abuse     Past Surgical History  Procedure Laterality Date  . Cesarean section    . Cesarean section N/A 02/27/2015    Procedure: CESAREAN SECTION;  Surgeon: Newton Pigg, MD;  Location: [facility];  Service: Obstetrics;  Laterality: N/A;    Social History:  reports that she has been smoking Cigarettes.  She has never used smokeless tobacco. She reports that she does not drink alcohol or use illicit drugs.  Family History:  Family History  Problem Relation Age of Onset  . Hypertension Mother   . Hypertension Father      Prior to Admission medications   Medication Sig Start Date End Date Taking? Authorizing Provider  acetaminophen (TYLENOL) 500 MG tablet Take 500-1,000 mg by mouth every 4 (four) hours as needed for mild pain, moderate pain, fever or headache.   Yes Historical Provider, MD  TRI-PREVIFEM 0.18/0.215/0.25 MG-35 MCG tablet Take 1 tablet by mouth daily. 05/09/15  Yes Historical Provider, MD  ferrous sulfate 325 (65 FE) MG tablet Take 1 tablet (325 mg total) by mouth 2 (two) times daily with a meal. Patient not taking: Reported on 05/25/2015 03/01/15   Newton Pigg, MD  ibuprofen (ADVIL,MOTRIN) 600 MG tablet Take 1 tablet (600 mg total) by mouth  every 6 (six) hours as needed for mild pain. Patient not taking: Reported on 05/25/2015 03/01/15   Newton Pigg, MD  oxyCODONE-acetaminophen (PERCOCET/ROXICET) 5-325 MG per tablet Take 1 tablet by mouth every 6 (six) hours as needed (for pain scale 4-7). Patient not taking: Reported on 05/25/2015 03/01/15   Newton Pigg, MD    Physical Exam: Filed Vitals:   05/25/15  0251  BP: 126/79  Pulse: 77  Resp: 18  SpO2: 97%   General: Not in acute distress HEENT:       Eyes: PERRL, EOMI, no scleral icterus.       ENT: No discharge from the ears and nose, no pharynx injection, no tonsillar enlargement.        Neck: No JVD, no bruit, no mass felt. Heme: No neck lymph node enlargement. Cardiac: S1/S2, RRR, No murmurs, No gallops or rubs. Pulm:  No rales, wheezing, rhonchi or rubs. Abd: Soft, nondistended, nontender, no rebound pain, no organomegaly, BS present. Ext: No pitting leg edema bilaterally. 2+DP/PT pulse bilaterally. No tenderness over calf areas. Musculoskeletal: No joint deformities, No joint redness or warmth, no limitation of ROM in spin. Skin: No rashes.  Neuro: Alert, oriented X3, cranial nerves II-XII grossly intact, muscle strength 5/5 in all extremities, sensation to light touch intact.  Psych: Patient is not psychotic, no suicidal or hemocidal ideation.  Labs on Admission:  Basic Metabolic Panel:  Recent Labs Lab 05/25/15 0107  NA 138  K 4.3  CL 106  CO2 25  GLUCOSE 143*  BUN 10  CREATININE 0.78  CALCIUM 8.9   Liver Function Tests:  Recent Labs Lab 05/25/15 0107  AST 16  ALT 9*  ALKPHOS 67  BILITOT 0.6  PROT 7.2  ALBUMIN 3.5    Recent Labs Lab 05/25/15 0107  LIPASE 16*   No results for input(s): AMMONIA in the last 168 hours. CBC:  Recent Labs Lab 05/25/15 0107  WBC 7.8  HGB 13.1  HCT 38.4  MCV 87.5  PLT 284   Cardiac Enzymes: No results for input(s): CKTOTAL, CKMB, CKMBINDEX, TROPONINI in the last 168 hours.  BNP (last 3 results) No results for input(s): BNP in the last 8760 hours.  ProBNP (last 3 results) No results for input(s): PROBNP in the last 8760 hours.  CBG: No results for input(s): GLUCAP in the last 168 hours.  Radiological Exams on Admission: Ct Angio Chest Pe W/cm &/or Wo Cm  05/25/2015   CLINICAL DATA:  Right upper quadrant pain. Elevated D-dimer. No trauma or injury. Cesarean  section 3 months ago.  EXAM: CT ANGIOGRAPHY CHEST WITH CONTRAST  TECHNIQUE: Multidetector CT imaging of the chest was performed using the standard protocol during bolus administration of intravenous contrast. Multiplanar CT image reconstructions and MIPs were obtained to evaluate the vascular anatomy.  CONTRAST:  116m OMNIPAQUE IOHEXOL 350 MG/ML SOLN  COMPARISON:  None.  FINDINGS: Technically adequate study with moderately good opacification of the central and segmental pulmonary arteries. Filling defects are demonstrated in the distal right pulmonary artery extending into right upper lobe and lower lobe segmental vessels. Small filling defect in a left lower lobe segmental vessel. Appearance is consistent with acute pulmonary embolus.  Normal heart size. RV to LV ratio is within normal limits at 0.98. No findings to suggest right heart strain. Normal caliber thoracic aorta. Great vessel origins are patent. No significant lymphadenopathy in the chest. Esophagus is decompressed.  Patchy areas of consolidation in the right lower lung may represent focal areas of infarct.  Left lung is clear. No pleural effusions. No pneumothorax.  Included portions of the upper abdominal organs are grossly unremarkable. No destructive bone lesions.  Review of the MIP images confirms the above findings.  IMPRESSION: Filling defects in the distal right main pulmonary artery extending into upper and lower lobe branches consistent with acute pulmonary embolus. Small emboli in the left lower lobe pulmonary arteries. Patchy areas of consolidation in the right lung may represent areas of infarct.  These results were called by telephone at the time of interpretation on 05/25/2015 at 3:15 am to NP [facility] , who verbally acknowledged these results.   Electronically Signed   By: Lucienne Capers M.D.   On: 05/25/2015 03:18    EKG:  Not done in ED, will get one.   Assessment/Plan Active Problems:   PE (pulmonary embolism)   Tobacco  abuse  PE: As evidenced by CTA, no R heart straining pattern. Risk factors include OCP use, and recent giving birth. Family history of blood clot.  -admit to tele -heparin drip initiated by ED -2D echocardiogram ordered -LE dopplers ordered to evaluate for DVT -EKG -Hypercoag panel -pain control: When necessary Percocet and morphine -Discontinue OCP -Lab: Hypercoagulable panel, CRP, ESR, ANA, UDS, HIV antibody  Tobacco abuse: -Did counseling about importance of quitting smoking -Nicotine patch   DVT ppx: On IV Heparin   Code Status: Full code Family Communication: None at bed side. Disposition Plan: Admit to inpatient   Date of Service 05/25/2015    Ivor Costa [facility] Pager [phone]  If 7PM-7AM, please contact night-coverage [email or web address] Password [email or web address] 05/25/2015, 4:04 AM

## 2015-05-25 NOTE — ED Provider Notes (Signed)
CSN: [id number]     Arrival date & time 05/25/15  0047 History   First MD Initiated Contact with Patient 05/25/15 0104     Chief Complaint  Patient presents with  . Abdominal Pain     (Consider location/radiation/quality/duration/timing/severity/associated sxs/prior Treatment) HPI Comments: This normally healthy 34 year old female who gave birth 3 months ago via C-section reports that this morning when she woke up she had right upper quadrant pain that was steady and constant in nature and since about 9 PM, the pain has gotten much worse.  She denies any nausea, vomiting, trauma states she hasn't had much appetite due to the pain does denies any recent travel.  She is not currently nursing her child does not have a history of bladder disease or kidney stands she's not had any recent illnesses, no nausea, vomiting, diarrhea, fevers, URI symptoms or cough  Patient is a 34 y.o. female presenting with abdominal pain. The history is provided by the patient.  Abdominal Pain Pain location:  RUQ Pain quality: aching and pressure   Pain quality: not cramping   Pain radiates to:  R shoulder Pain severity:  Moderate Onset quality:  Gradual Duration:  1 day Timing:  Constant Progression:  Worsening Chronicity:  New Context: not alcohol use, not awakening from sleep, not diet changes, not eating, not laxative use, not medication withdrawal, not previous surgeries, not recent illness, not recent sexual activity, not recent travel, not retching, not sick contacts, not suspicious food intake and not trauma   Relieved by:  Nothing Worsened by:  Nothing tried Ineffective treatments:  None tried Associated symptoms: no chest pain, no constipation, no cough, no diarrhea, no fever, no nausea, no vaginal discharge and no vomiting     Past Medical History  Diagnosis Date  . Medical history non-contributory    Past Surgical History  Procedure Laterality Date  . Cesarean section    . Cesarean section  N/A 02/27/2015    Procedure: CESAREAN SECTION;  Surgeon: Newton Pigg, MD;  Location: [facility];  Service: Obstetrics;  Laterality: N/A;   History reviewed. No pertinent family history. Social History  Substance Use Topics  . Smoking status: Current Every Day Smoker    Types: Cigarettes  . Smokeless tobacco: Never Used  . Alcohol Use: No   OB History    Gravida Para Term Preterm AB TAB SAB Ectopic Multiple Living   7 7 7       0 7     Review of Systems  Constitutional: Negative for fever.  Respiratory: Negative for cough.   Cardiovascular: Negative for chest pain.  Gastrointestinal: Positive for abdominal pain. Negative for nausea, vomiting, diarrhea and constipation.  Genitourinary: Negative for flank pain and vaginal discharge.  All other systems reviewed and are negative.     Allergies  Review of patient's allergies indicates no known allergies.  Home Medications   Prior to Admission medications   Medication Sig Start Date End Date Taking? Authorizing Provider  acetaminophen (TYLENOL) 500 MG tablet Take 500-1,000 mg by mouth every 4 (four) hours as needed for mild pain, moderate pain, fever or headache.   Yes Historical Provider, MD  TRI-PREVIFEM 0.18/0.215/0.25 MG-35 MCG tablet Take 1 tablet by mouth daily. 05/09/15  Yes Historical Provider, MD  ferrous sulfate 325 (65 FE) MG tablet Take 1 tablet (325 mg total) by mouth 2 (two) times daily with a meal. Patient not taking: Reported on 05/25/2015 03/01/15   Newton Pigg, MD  ibuprofen (ADVIL,MOTRIN) 600 MG tablet Take  1 tablet (600 mg total) by mouth every 6 (six) hours as needed for mild pain. Patient not taking: Reported on 05/25/2015 03/01/15   Newton Pigg, MD  oxyCODONE-acetaminophen (PERCOCET/ROXICET) 5-325 MG per tablet Take 1 tablet by mouth every 6 (six) hours as needed (for pain scale 4-7). Patient not taking: Reported on 05/25/2015 03/01/15   Newton Pigg, MD   BP 126/79 mmHg  Pulse 77  Resp 18  SpO2 97%  LMP   Physical Exam  Constitutional: She appears well-developed and well-nourished.  HENT:  Head: Normocephalic.  Eyes: Pupils are equal, round, and reactive to light.  Neck: Normal range of motion.  Cardiovascular: Normal rate and regular rhythm.   Pulmonary/Chest: Effort normal and breath sounds normal. No respiratory distress. She exhibits no tenderness.  Abdominal: Soft. She exhibits no distension. There is tenderness in the right upper quadrant and epigastric area.  Musculoskeletal: Normal range of motion.  Neurological: She is alert.  Skin: Skin is warm.    ED Course  Procedures (including critical care time) Labs Review Labs Reviewed  LIPASE, BLOOD - Abnormal; Notable for the following:    Lipase 16 (*)    All other components within normal limits  COMPREHENSIVE METABOLIC PANEL - Abnormal; Notable for the following:    Glucose, Bld 143 (*)    ALT 9 (*)    All other components within normal limits  D-DIMER, QUANTITATIVE (NOT AT [facility]) - Abnormal; Notable for the following:    D-Dimer, Quant 1.82 (*)    All other components within normal limits  CBC  URINALYSIS, ROUTINE W REFLEX MICROSCOPIC (NOT AT [facility])  APTT  PROTIME-INR  I-STAT BETA HCG BLOOD, ED ([facility], [facility], [facility] ONLY)    Imaging Review Ct Angio Chest Pe W/cm &/or Wo Cm  05/25/2015   CLINICAL DATA:  Right upper quadrant pain. Elevated D-dimer. No trauma or injury. Cesarean section 3 months ago.  EXAM: CT ANGIOGRAPHY CHEST WITH CONTRAST  TECHNIQUE: Multidetector CT imaging of the chest was performed using the standard protocol during bolus administration of intravenous contrast. Multiplanar CT image reconstructions and MIPs were obtained to evaluate the vascular anatomy.  CONTRAST:  181mL OMNIPAQUE IOHEXOL 350 MG/ML SOLN  COMPARISON:  None.  FINDINGS: Technically adequate study with moderately good opacification of the central and segmental pulmonary arteries. Filling defects are demonstrated in the distal right pulmonary artery  extending into right upper lobe and lower lobe segmental vessels. Small filling defect in a left lower lobe segmental vessel. Appearance is consistent with acute pulmonary embolus.  Normal heart size. RV to LV ratio is within normal limits at 0.98. No findings to suggest right heart strain. Normal caliber thoracic aorta. Great vessel origins are patent. No significant lymphadenopathy in the chest. Esophagus is decompressed.  Patchy areas of consolidation in the right lower lung may represent focal areas of infarct. Left lung is clear. No pleural effusions. No pneumothorax.  Included portions of the upper abdominal organs are grossly unremarkable. No destructive bone lesions.  Review of the MIP images confirms the above findings.  IMPRESSION: Filling defects in the distal right main pulmonary artery extending into upper and lower lobe branches consistent with acute pulmonary embolus. Small emboli in the left lower lobe pulmonary arteries. Patchy areas of consolidation in the right lung may represent areas of infarct.  These results were called by telephone at the time of interpretation on 05/25/2015 at 3:15 am to NP [facility] , who verbally acknowledged these results.   Electronically Signed  By: Lucienne Capers M.D.   On: 05/25/2015 03:18   I have personally reviewed and evaluated these images and lab results as part of my medical decision-making.   EKG Interpretation None     Radiology called to inform me that the patient does have indeed have Pulmonary emboli.  I contacted pharmacy to start heparin drip and will admit the patient to the hospital for continued care.  I did discuss this with patient.  She is agreeable to staying in the hospital for treatment MDM   Final diagnoses:  Pulmonary emboli         Junius Creamer, NP 05/25/15 8828  Merryl Hacker, MD 05/25/15 [phone]

## 2015-05-25 NOTE — Progress Notes (Signed)
[facility] PROGRESS NOTE  Cynthia [name] FVC:[id number] DOB: [birth year] DOA: 05/25/2015 PCP: No PCP Per Patient  Brief narrative 34 year old African-American female with history of tobacco use, on OCP for the past 6 weeks and had a resection delivery 3 months back (has total 7 children), presented with acute onset of right-sided chest pain for 2 days worsened with deep inspiration. The pain was radiating to her shoulder blades . Denies any recent illness or travel. Denies any calf tenderness. Denies any personal or family history of blood clots. In the ED CT angina the chest showed filling defects in the distal right main pulmonary artery extending into the upper and lower lobe branches consistent with acute PE. Also showed small emboli in the left lower lobe only arteries. A patchy area of consolidation in the right lung suggested may represent areas of infarct. Patient vitals initial blood work was unremarkable. Admitted to telemetry for further management. Hypercoagulable workup sent on admission.  Assessment/Plan: Acute PE Hypercoagulable state versus secondary to contributing factors for DVT including OCP use and active smoking. -Started on IV heparin drip. Still has right-sided pleurisy but no tachypnea, tachycardia or hypoxia. -Doppler lower extremities negative for DVT. Check 2-D echo. Continue telemetry monitoring. -Patient appears drowsy with narcotics. Will switch to when necessary Toradol and Ultracet. -Counseled strongly on smoking cessation. -If hypercoagulable workup negative will need anticoagulation for 6 months.  Tobacco use Counseled on cessation. Order nicotine patch.  Sinus bradycardia Heart rate in high 40s-50s. Monitor on telemetry.  DVT prophylaxis: IV heparin   diet: Regular  Code Status: Full code Family Communication: None at bedside  Disposition Plan: Home once clinically improved, workup completed and anticoagulation  determined.   Consultants:  None  Procedures:  CT  Exam of the chest  Doppler lower extremities  2-D echo (pending)  Antibiotics:  None  HPI/Subjective: Seen and examined. Reports right-sided chest pain with inspiration  Objective: Filed Vitals:   05/25/15 0428  BP: 125/71  Pulse: 69  Temp: 97.9 F (36.6 C)  Resp: 20    Intake/Output Summary (Last 24 hours) at 05/25/15 1138 Last data filed at 05/25/15 1010  Gross per 24 hour  Intake   1480 ml  Output      0 ml  Net   1480 ml   Filed Weights   05/25/15 0428  Weight: 66.724 kg (147 lb 1.6 oz)    Exam:   General:  Not in distress, appears drowsy  HEENT: No pallor, most oral mucosa  Chest: Clear to auscultation bilaterally, no added sounds  CVS: Normal S1 and S2, no murmurs rub or gallop  GI: Soft, nondistended nontender, bowel sounds present  Musculoskeletal: Warm, no edema, no calf tenderness  CNS: Drowsy but oriented      Data Reviewed: Basic Metabolic Panel:  Recent Labs Lab 05/25/15 0107 05/25/15 0550  NA 138 138  K 4.3 4.0  CL 106 108  CO2 25 23  GLUCOSE 143* 129*  BUN 10 8  CREATININE 0.78 0.67  CALCIUM 8.9 8.3*   Liver Function Tests:  Recent Labs Lab 05/25/15 0107  AST 16  ALT 9*  ALKPHOS 67  BILITOT 0.6  PROT 7.2  ALBUMIN 3.5    Recent Labs Lab 05/25/15 0107  LIPASE 16*   No results for input(s): AMMONIA in the last 168 hours. CBC:  Recent Labs Lab 05/25/15 0107 05/25/15 0550  WBC 7.8 10.9*  HGB 13.1 11.8*  HCT 38.4 35.7*  MCV 87.5 87.5  PLT 284  274   Cardiac Enzymes: No results for input(s): CKTOTAL, CKMB, CKMBINDEX, TROPONINI in the last 168 hours. BNP (last 3 results) No results for input(s): BNP in the last 8760 hours.  ProBNP (last 3 results) No results for input(s): PROBNP in the last 8760 hours.  CBG:  Recent Labs Lab 05/25/15 0755  GLUCAP 137*    No results found for this or any previous visit (from the past 240 hour(s)).    Studies: Ct Angio Chest Pe W/cm &/or Wo Cm  05/25/2015   CLINICAL DATA:  Right upper quadrant pain. Elevated D-dimer. No trauma or injury. Cesarean section 3 months ago.  EXAM: CT ANGIOGRAPHY CHEST WITH CONTRAST  TECHNIQUE: Multidetector CT imaging of the chest was performed using the standard protocol during bolus administration of intravenous contrast. Multiplanar CT image reconstructions and MIPs were obtained to evaluate the vascular anatomy.  CONTRAST:  167mL OMNIPAQUE IOHEXOL 350 MG/ML SOLN  COMPARISON:  None.  FINDINGS: Technically adequate study with moderately good opacification of the central and segmental pulmonary arteries. Filling defects are demonstrated in the distal right pulmonary artery extending into right upper lobe and lower lobe segmental vessels. Small filling defect in a left lower lobe segmental vessel. Appearance is consistent with acute pulmonary embolus.  Normal heart size. RV to LV ratio is within normal limits at 0.98. No findings to suggest right heart strain. Normal caliber thoracic aorta. Great vessel origins are patent. No significant lymphadenopathy in the chest. Esophagus is decompressed.  Patchy areas of consolidation in the right lower lung may represent focal areas of infarct. Left lung is clear. No pleural effusions. No pneumothorax.  Included portions of the upper abdominal organs are grossly unremarkable. No destructive bone lesions.  Review of the MIP images confirms the above findings.  IMPRESSION: Filling defects in the distal right main pulmonary artery extending into upper and lower lobe branches consistent with acute pulmonary embolus. Small emboli in the left lower lobe pulmonary arteries. Patchy areas of consolidation in the right lung may represent areas of infarct.  These results were called by telephone at the time of interpretation on 05/25/2015 at 3:15 am to NP [facility] , who verbally acknowledged these results.   Electronically Signed   By: Lucienne Capers M.D.   On: 05/25/2015 03:18    Scheduled Meds: . nicotine  21 mg Transdermal Daily  . sodium chloride  3 mL Intravenous Q12H   Continuous Infusions: . heparin 1,100 Units/hr (05/25/15 0350)      Time spent: 25 minutes    Kennith Morss, [place]  [facility] Pager [phone] If 7PM-7AM, please contact night-coverage at [email or web address], password [facility] 05/25/2015, 11:38 AM  LOS: 0 days

## 2015-05-25 NOTE — ED Notes (Signed)
Bed: QA44 Expected date:  Expected time:  Means of arrival:  Comments: EMS 40F RUQ pain/3 mo PP

## 2015-05-25 NOTE — Progress Notes (Signed)
*  Preliminary Results* Bilateral lower extremity venous duplex completed. Bilateral lower extremities are negative for deep vein thrombosis. There is no evidence of Baker's cyst bilaterally.  05/25/2015  Maudry Mayhew, RVT, RDCS, RDMS

## 2015-05-26 ENCOUNTER — Inpatient Hospital Stay (HOSPITAL_COMMUNITY): Payer: Medicaid Other

## 2015-05-26 DIAGNOSIS — R079 Chest pain, unspecified: Secondary | ICD-10-CM

## 2015-05-26 DIAGNOSIS — R001 Bradycardia, unspecified: Secondary | ICD-10-CM | POA: Diagnosis present

## 2015-05-26 LAB — CBC
HEMATOCRIT: 33.4 % — AB (ref 36.0–46.0)
HEMOGLOBIN: 10.9 g/dL — AB (ref 12.0–15.0)
MCH: 28.8 pg (ref 26.0–34.0)
MCHC: 32.6 g/dL (ref 30.0–36.0)
MCV: 88.4 fL (ref 78.0–100.0)
Platelets: 261 10*3/uL (ref 150–400)
RBC: 3.78 MIL/uL — ABNORMAL LOW (ref 3.87–5.11)
RDW: 13.4 % (ref 11.5–15.5)
WBC: 5 10*3/uL (ref 4.0–10.5)

## 2015-05-26 LAB — GLUCOSE, CAPILLARY: GLUCOSE-CAPILLARY: 88 mg/dL (ref 65–99)

## 2015-05-26 LAB — HEPARIN LEVEL (UNFRACTIONATED)
HEPARIN UNFRACTIONATED: 0.48 [IU]/mL (ref 0.30–0.70)
Heparin Unfractionated: 0.25 IU/mL — ABNORMAL LOW (ref 0.30–0.70)

## 2015-05-26 MED ORDER — HEPARIN BOLUS VIA INFUSION
1000.0000 [IU] | Freq: Once | INTRAVENOUS | Status: AC
  Administered 2015-05-26: 1000 [IU] via INTRAVENOUS
  Filled 2015-05-26: qty 1000

## 2015-05-26 NOTE — Progress Notes (Signed)
Utilization Review Completed.Cynthia [name] T9/07/2015  

## 2015-05-26 NOTE — Progress Notes (Addendum)
ANTICOAGULATION CONSULT NOTE - Follow Up Consult  Pharmacy Consult for heparin  Indication: new pulmonary embolus  No Known Allergies  Patient Measurements: Height: 5\' 5"  (165.1 cm) Weight: 147 lb 1.6 oz (66.724 kg) IBW/kg (Calculated) : 57 Heparin Dosing Weight: 67 kg  Vital Signs: Temp: 98.2 F (36.8 C) (09/11 0558) Temp Source: Oral (09/11 0558) BP: 111/56 mmHg (09/11 0558) Pulse Rate: 51 (09/11 0558)  Labs:  Recent Labs  05/25/15 0105  05/25/15 0107  05/25/15 0550 05/25/15 1206 05/25/15 1813 05/26/15 0525  HGB  --   < > 13.1  --  11.8*  --   --  10.9*  HCT  --   --  38.4  --  35.7*  --   --  33.4*  PLT  --   --  284  --  274  --   --  261  APTT 31  --   --   --   --   --   --   --   LABPROT 12.3  --   --   --   --   --   --   --   INR 0.89  --   --   --   --   --   --   --   HEPARINUNFRC  --   --   --   < > 0.12* 0.37 0.35 0.25*  CREATININE  --   --  0.78  --  0.67  --   --   --   < > = values in this interval not displayed.  Estimated Creatinine Clearance: 91.7 mL/min (by C-G formula based on Cr of 0.67).  Assessment: Patient's a 34 y.o F s/p c-section three months ago who presented to the ED on 9/10 with c/o CP. Chest CTA showed new BL PE.  LE doppler negative for DVT.  She's currently on heparin for thrombus.  Today, 05/26/2015: - heparin level is sub-therapeutic at 0.25 - hgb trending down, plt ok - No bleeding documented - hypercoagulable panel pending    Goal of Therapy:  Heparin level 0.3-0.7 units/ml Monitor platelets by anticoagulation protocol: Yes   Plan:  - Heparin 1000 units x1 bolus, then increase drip to drip at 1250 units/hr - check 6 hour heparin level   Thelma Lorenzetti P 05/26/2015,9:57 AM  Adden: heparin level now back therapeutic at 0.48 after rate increased this morning .  Continue drip at current rate if 1250 units/hr.  Dia Sitter, PharmD, BCPS 05/26/2015 6:11 PM

## 2015-05-26 NOTE — Progress Notes (Signed)
[facility] PROGRESS NOTE  Cynthia [name] XFG:[id number] DOB: 1981/06/17 DOA: 05/25/2015 PCP: No PCP Per Patient  Brief narrative 34 year old African-American female with history of tobacco use, on OCP for the past 6 weeks and had a resection delivery 3 months back (has total 7 children), presented with acute onset of right-sided chest pain for 2 days worsened with deep inspiration. The pain was radiating to her shoulder blades . Denies any recent illness or travel. Denies any calf tenderness. Denies any personal or family history of blood clots. In the ED CT angina the chest showed filling defects in the distal right main pulmonary artery extending into the upper and lower lobe branches consistent with acute PE. Also showed small emboli in the left lower lobe only arteries. A patchy area of consolidation in the right lung suggested may represent areas of infarct. Patient vitals initial blood work was unremarkable. Admitted to telemetry for further management. Hypercoagulable workup sent on admission.  Assessment/Plan: Acute PE Hypercoagulable state versus secondary to contributing factors for DVT including OCP use and active smoking. -Started on IV heparin drip. Right-sided pleurisy improved. Hemodynamically stable. -Doppler lower extremities negative for DVT.  2-D echo results pending. Continue telemetry monitoring. - when necessary Toradol and Ultracet for right-sided chest pain. -depending upon her comfortable workup results will determine choice of anticoagulation and duration. Patient has medicaid. CM consulted to help with medications.  -If hypercoagulable workup negative will need anticoagulation for 6 months.  Tobacco use Counseled on cessation. nicotine patch.  Sinus bradycardia Heart rate in high 50s. Asymptomatic.  DVT prophylaxis: IV heparin   diet: Regular  Code Status: Full code Family Communication: None at bedside  Disposition Plan: Home once clinically  improved, workup completed and anticoagulation determined.   Consultants:  None  Procedures:  CT  Exam of the chest  Doppler lower extremities  2-D echo (pending)  Antibiotics:  None  HPI/Subjective: Seen and examined. Right-sided chest pain improved.  Objective: Filed Vitals:   05/26/15 0558  BP: 111/56  Pulse: 51  Temp: 98.2 F (36.8 C)  Resp: 16    Intake/Output Summary (Last 24 hours) at 05/26/15 1223 Last data filed at 05/25/15 1700  Gross per 24 hour  Intake 602.83 ml  Output      0 ml  Net 602.83 ml   Filed Weights   05/25/15 0428  Weight: 66.724 kg (147 lb 1.6 oz)    Exam:   General:  Not in distress,   HEENT: No pallor, most oral mucosa  Chest: Clear to auscultation bilaterally, no added sounds  CVS: Normal S1 and S2, no murmurs rub or gallop  GI: Soft, nondistended nontender, bowel sounds present  Musculoskeletal: Warm, no edema, no calf tenderness        Data Reviewed: Basic Metabolic Panel:  Recent Labs Lab 05/25/15 0107 05/25/15 0550  NA 138 138  K 4.3 4.0  CL 106 108  CO2 25 23  GLUCOSE 143* 129*  BUN 10 8  CREATININE 0.78 0.67  CALCIUM 8.9 8.3*   Liver Function Tests:  Recent Labs Lab 05/25/15 0107  AST 16  ALT 9*  ALKPHOS 67  BILITOT 0.6  PROT 7.2  ALBUMIN 3.5    Recent Labs Lab 05/25/15 0107  LIPASE 16*   No results for input(s): AMMONIA in the last 168 hours. CBC:  Recent Labs Lab 05/25/15 0107 05/25/15 0550 05/26/15 0525  WBC 7.8 10.9* 5.0  HGB 13.1 11.8* 10.9*  HCT 38.4 35.7* 33.4*  MCV 87.5  87.5 88.4  PLT 284 274 261   Cardiac Enzymes: No results for input(s): CKTOTAL, CKMB, CKMBINDEX, TROPONINI in the last 168 hours. BNP (last 3 results) No results for input(s): BNP in the last 8760 hours.  ProBNP (last 3 results) No results for input(s): PROBNP in the last 8760 hours.  CBG:  Recent Labs Lab 05/25/15 0755 05/26/15 0727  GLUCAP 137* 88    No results found for this  or any previous visit (from the past 240 hour(s)).   Studies: Ct Angio Chest Pe W/cm &/or Wo Cm  05/25/2015   CLINICAL DATA:  Right upper quadrant pain. Elevated D-dimer. No trauma or injury. Cesarean section 3 months ago.  EXAM: CT ANGIOGRAPHY CHEST WITH CONTRAST  TECHNIQUE: Multidetector CT imaging of the chest was performed using the standard protocol during bolus administration of intravenous contrast. Multiplanar CT image reconstructions and MIPs were obtained to evaluate the vascular anatomy.  CONTRAST:  129mL OMNIPAQUE IOHEXOL 350 MG/ML SOLN  COMPARISON:  None.  FINDINGS: Technically adequate study with moderately good opacification of the central and segmental pulmonary arteries. Filling defects are demonstrated in the distal right pulmonary artery extending into right upper lobe and lower lobe segmental vessels. Small filling defect in a left lower lobe segmental vessel. Appearance is consistent with acute pulmonary embolus.  Normal heart size. RV to LV ratio is within normal limits at 0.98. No findings to suggest right heart strain. Normal caliber thoracic aorta. Great vessel origins are patent. No significant lymphadenopathy in the chest. Esophagus is decompressed.  Patchy areas of consolidation in the right lower lung may represent focal areas of infarct. Left lung is clear. No pleural effusions. No pneumothorax.  Included portions of the upper abdominal organs are grossly unremarkable. No destructive bone lesions.  Review of the MIP images confirms the above findings.  IMPRESSION: Filling defects in the distal right main pulmonary artery extending into upper and lower lobe branches consistent with acute pulmonary embolus. Small emboli in the left lower lobe pulmonary arteries. Patchy areas of consolidation in the right lung may represent areas of infarct.  These results were called by telephone at the time of interpretation on 05/25/2015 at 3:15 am to NP [facility] , who verbally acknowledged these  results.   Electronically Signed   By: Lucienne Capers M.D.   On: 05/25/2015 03:18    Scheduled Meds: . nicotine  14 mg Transdermal Daily  . sodium chloride  3 mL Intravenous Q12H   Continuous Infusions: . heparin 1,250 Units/hr (05/26/15 1051)      Time spent: 25 minutes    Naveen Lorusso  [facility] Pager [phone] If 7PM-7AM, please contact night-coverage at [email or web address], password [facility] 05/26/2015, 12:23 PM  LOS: 1 day

## 2015-05-26 NOTE — Progress Notes (Signed)
  Echocardiogram 2D Echocardiogram has been performed.  Lysle Rubens 05/26/2015, 9:47 AM

## 2015-05-27 DIAGNOSIS — I2699 Other pulmonary embolism without acute cor pulmonale: Principal | ICD-10-CM

## 2015-05-27 LAB — BASIC METABOLIC PANEL
ANION GAP: 5 (ref 5–15)
BUN: 6 mg/dL (ref 6–20)
CHLORIDE: 106 mmol/L (ref 101–111)
CO2: 28 mmol/L (ref 22–32)
Calcium: 8.6 mg/dL — ABNORMAL LOW (ref 8.9–10.3)
Creatinine, Ser: 0.71 mg/dL (ref 0.44–1.00)
GFR calc Af Amer: >60 mL/min (ref >60)
GLUCOSE: 88 mg/dL (ref 65–99)
POTASSIUM: 4 mmol/L (ref 3.5–5.1)
Sodium: 139 mmol/L (ref 135–145)

## 2015-05-27 LAB — CBC
HEMATOCRIT: 33.7 % — AB (ref 36.0–46.0)
Hemoglobin: 11.1 g/dL — ABNORMAL LOW (ref 12.0–15.0)
MCH: 29.1 pg (ref 26.0–34.0)
MCHC: 32.9 g/dL (ref 30.0–36.0)
MCV: 88.2 fL (ref 78.0–100.0)
Platelets: 267 10*3/uL (ref 150–400)
RBC: 3.82 MIL/uL — ABNORMAL LOW (ref 3.87–5.11)
RDW: 13.3 % (ref 11.5–15.5)
WBC: 5.6 10*3/uL (ref 4.0–10.5)

## 2015-05-27 LAB — CARDIOLIPIN ANTIBODIES, IGG, IGM, IGA
Anticardiolipin IgA: <9 APL U/mL (ref 0–11)
Anticardiolipin IgG: <9 GPL U/mL (ref 0–14)
Anticardiolipin IgM: <9 MPL U/mL (ref 0–12)

## 2015-05-27 LAB — GLUCOSE, CAPILLARY: Glucose-Capillary: 88 mg/dL (ref 65–99)

## 2015-05-27 LAB — ANTINUCLEAR ANTIBODIES, IFA: ANA Ab, IFA: NEGATIVE

## 2015-05-27 LAB — TSH: TSH: 1.391 u[IU]/mL (ref 0.350–4.500)

## 2015-05-27 LAB — HEPARIN LEVEL (UNFRACTIONATED): Heparin Unfractionated: 0.42 IU/mL (ref 0.30–0.70)

## 2015-05-27 MED ORDER — RIVAROXABAN 15 MG PO TABS
15.0000 mg | ORAL_TABLET | Freq: Two times a day (BID) | ORAL | Status: DC
  Administered 2015-05-27 – 2015-05-28 (×2): 15 mg via ORAL
  Filled 2015-05-27 (×3): qty 1

## 2015-05-27 MED ORDER — RIVAROXABAN 20 MG PO TABS: 20.0000 mg | ORAL_TABLET | Freq: Every day | ORAL | Status: DC

## 2015-05-27 NOTE — Progress Notes (Addendum)
ANTICOAGULATION CONSULT NOTE - Follow Up Consult  Pharmacy Consult for heparin >> Xarelto Indication: new pulmonary embolus  No Known Allergies  Patient Measurements: Height: 5\' 5"  (165.1 cm) Weight: 147 lb 1.6 oz (66.724 kg) IBW/kg (Calculated) : 57 Heparin Dosing Weight: 67 kg  Vital Signs: Temp: 98 F (36.7 C) (09/12 0556) Temp Source: Oral (09/12 0556) BP: 131/56 mmHg (09/12 0556) Pulse Rate: 47 (09/12 0556)  Labs:  Recent Labs  05/25/15 0105  05/25/15 0107 05/25/15 0550  05/26/15 0525 05/26/15 1634 05/27/15 0514  HGB  --   < > 13.1 11.8*  --  10.9*  --  11.1*  HCT  --   < > 38.4 35.7*  --  33.4*  --  33.7*  PLT  --   < > 284 274  --  261  --  267  APTT 31  --   --   --   --   --   --   --   LABPROT 12.3  --   --   --   --   --   --   --   INR 0.89  --   --   --   --   --   --   --   HEPARINUNFRC  --   --   --  0.12*  < > 0.25* 0.48 0.42  CREATININE  --   --  0.78 0.67  --   --   --   --   < > = values in this interval not displayed.  Estimated Creatinine Clearance: 91.7 mL/min (by C-G formula based on Cr of 0.67).  Assessment: Patient's a 34 y.o F s/p c-section three months ago who presented to the ED on 9/10 with c/o CP. Chest CTA showed new BL PE.  LE doppler negative for DVT.  She's currently on heparin for thrombus.  Today, 05/27/2015:  CBC stable  Most recent heparin level therapeutic on 1250 units/hr  No bleeding/line issues reported by RN  Renal function intact    Goal of Therapy:  Heparin level 0.3-0.7 units/ml Monitor platelets by anticoagulation protocol: Yes   Plan:   Continue heparin infusion at 1250 units/hr  Daily heparin level, CBC  F/u plans for outpatient anticoagulation (CM already aware)  Monitor for signs of bleeding or worsened thrombosis   Reuel Boom, PharmD, BCPS Pager: [phone] 05/27/2015, 7:54 AM  Addendum:  Now requested to switch to Xarelto for outpatient anticoagulation.   Labs as above  Stop Heparin  infusion at 1700 today  Begin Xarelto 15 mg PO bid w/c x 21 days  On 06/17/15, begin 20 mg daily w/c  Pharmacy to educate on Xarelto prior to discharge  Reuel Boom, PharmD, BCPS Pager: [phone] 05/27/2015, 1:18 PM

## 2015-05-27 NOTE — Care Management Note (Signed)
Case Management Note  Patient Details  Name: Cynthia [name] MRN: [id number] Date of Birth: September 18, 1980  Subjective/Objective:33 y/o f admitted w/Pulmonary Embolism. From home. Referral for med check on affordability-xarelto/eliquis.Patient has medicaid-no benefit check needed since govt health insurance & co-pay is no more than $15(Patient states she can afford)No med asst available since has medicaid w/script coverage/govt program. MD notified.                  Action/Plan:d/c plan home.No d/c needs.   Expected Discharge Date:                  Expected Discharge Plan:  Home/Self Care  In-House Referral:     Discharge planning Services  CM Consult, Medication Assistance  Post Acute Care Choice:    Choice offered to:     DME Arranged:    DME Agency:     HH Arranged:    HH Agency:     Status of Service:  In process, will continue to follow  Medicare Important Message Given:    Date Medicare IM Given:    Medicare IM give by:    Date Additional Medicare IM Given:    Additional Medicare Important Message give by:     If discussed at [place] of Stay Meetings, dates discussed:    Additional Comments:  Dessa Phi, RN 05/27/2015, 10:30 AM

## 2015-05-27 NOTE — Progress Notes (Signed)
[facility] PROGRESS NOTE  Cynthia [name] WHQ:[id number] DOB: [birth year] DOA: 05/25/2015 PCP: No PCP Per Patient  Brief narrative 34 year old African-American female with history of tobacco use, on OCP for the past 6 weeks and had a resection delivery 3 months back (has total 7 children), presented with acute onset of right-sided chest pain for 2 days worsened with deep inspiration. The pain was radiating to her shoulder blades . Denies any recent illness or travel. Denies any calf tenderness. Denies any personal or family history of blood clots. In the ED CT angina the chest showed filling defects in the distal right main pulmonary artery extending into the upper and lower lobe branches consistent with acute PE. Also showed small emboli in the left lower lobe only arteries. A patchy area of consolidation in the right lung suggested may represent areas of infarct. Patient vitals initial blood work was unremarkable. Admitted to telemetry for further management. Hypercoagulable workup sent on admission.  Assessment/Plan: Acute PE Hypercoagulable state versus secondary to contributing factors for DVT including OCP use and active smoking. -Started on IV heparin drip. Right-sided pleurisy improved. Hemodynamically stable. -Doppler lower extremities negative for DVT.  2-D echo normal EF, no right side heart strain.  --Ultracet for right-sided chest pain prn -If hypercoagulable workup negative will need anticoagulation for 6 months. Hypercoagulable work up pending.  -will transition to [employer].  -HIV  negative. ANA negative.   Tobacco use Counseled on cessation. nicotine patch.  Sinus bradycardia Heart rate in high 50s. Asymptomatic. Check TSH. bmet  DVT prophylaxis: IV heparin , transition to xarelto  diet: Regular  Code Status: Full code Family Communication: None at bedside  Disposition Plan: Home once clinically improved, workup completed and anticoagulation  determined.   Consultants:  None  Procedures:  CT  Exam of the chest  Doppler lower extremities  2-D echo (pending)  Antibiotics:  None  HPI/Subjective: She is feeling better. She agree with xarelto. Risk benefit explain to patient.   Objective: Filed Vitals:   05/27/15 0556  BP: 131/56  Pulse: 47  Temp: 98 F (36.7 C)  Resp: 16    Intake/Output Summary (Last 24 hours) at 05/27/15 1353 Last data filed at 05/26/15 2100  Gross per 24 hour  Intake 484.23 ml  Output      1 ml  Net 483.23 ml   Filed Weights   05/25/15 0428  Weight: 66.724 kg (147 lb 1.6 oz)    Exam:   General:  Not in distress,   HEENT: No pallor, most oral mucosa  Chest: Clear to auscultation bilaterally, no added sounds  CVS: Normal S1 and S2, no murmurs rub or gallop  GI: Soft, nondistended nontender, bowel sounds present  Musculoskeletal: Warm, no edema, no calf tenderness        Data Reviewed: Basic Metabolic Panel:  Recent Labs Lab 05/25/15 0107 05/25/15 0550  NA 138 138  K 4.3 4.0  CL 106 108  CO2 25 23  GLUCOSE 143* 129*  BUN 10 8  CREATININE 0.78 0.67  CALCIUM 8.9 8.3*   Liver Function Tests:  Recent Labs Lab 05/25/15 0107  AST 16  ALT 9*  ALKPHOS 67  BILITOT 0.6  PROT 7.2  ALBUMIN 3.5    Recent Labs Lab 05/25/15 0107  LIPASE 16*   No results for input(s): AMMONIA in the last 168 hours. CBC:  Recent Labs Lab 05/25/15 0107 05/25/15 0550 05/26/15 0525 05/27/15 0514  WBC 7.8 10.9* 5.0 5.6  HGB 13.1 11.8* 10.9*  11.1*  HCT 38.4 35.7* 33.4* 33.7*  MCV 87.5 87.5 88.4 88.2  PLT 284 274 261 267   Cardiac Enzymes: No results for input(s): CKTOTAL, CKMB, CKMBINDEX, TROPONINI in the last 168 hours. BNP (last 3 results) No results for input(s): BNP in the last 8760 hours.  ProBNP (last 3 results) No results for input(s): PROBNP in the last 8760 hours.  CBG:  Recent Labs Lab 05/25/15 0755 05/26/15 0727 05/27/15 0758  GLUCAP 137*  88 88    No results found for this or any previous visit (from the past 240 hour(s)).   Studies: No results found.  Scheduled Meds: . nicotine  14 mg Transdermal Daily  . Rivaroxaban  15 mg Oral BID WC  . [START ON 06/17/2015] rivaroxaban  20 mg Oral Q supper  . sodium chloride  3 mL Intravenous Q12H   Continuous Infusions: . heparin 1,250 Units/hr (05/26/15 2150)      Time spent: 25 minutes    Adilynn Bessey A  [facility] Pager [phone] If 7PM-7AM, please contact night-coverage at [email or web address], password [facility] 05/27/2015, 1:53 PM  LOS: 2 days

## 2015-05-27 NOTE — Evaluation (Signed)
  Occupational Therapy Evaluation Patient Details Name: Cynthia [name] MRN: [id number] DOB: 1980-11-08 Today's Date: 2015-05-28    History of Present Illness 34 year old African-American female with history of tobacco use, on OCP for the past 6 weeks and had a resection delivery 3 months back (has total 7 children), presented with acute onset of right-sided chest pain for 2 days worsened with deep inspiration. The pain was radiating to her shoulder blades . Denies any recent illness or travel. Denies any calf tenderness. Denies any personal or family history of blood clots.   Clinical Impression   Pt admitted with PE.  Pt is I with ADL activity and does not need further OT    Follow Up Recommendations  No OT follow up    Equipment Recommendations  None recommended by OT       Precautions / Restrictions        Mobility Bed Mobility Overal bed mobility: Independent                Transfers Overall transfer level: Independent                         ADL Overall ADL's : At baseline                                       General ADL Comments: OT did educate pt regarding energy conservation s/p hospitalization               Pertinent Vitals/Pain Pain Assessment: No/denies pain     Hand Dominance     Extremity/Trunk Assessment Upper Extremity Assessment Upper Extremity Assessment: Overall WFL for tasks assessed           Communication     Cognition Arousal/Alertness: Awake/alert Behavior During Therapy: WFL for tasks assessed/performed Overall Cognitive Status: Within Functional Limits for tasks assessed                                                 OT Goals(Current goals can be found in the care plan section) Acute Rehab OT Goals Patient Stated Goal: home soon  OT Frequency:     Barriers to D/C:               End of Session Nurse Communication: Mobility status  Activity Tolerance: Patient  tolerated treatment well Patient left: in bed;with call bell/phone within reach   Time: 1111-1120 OT Time Calculation (min): 9 min Charges:  OT General Charges $OT Visit: 1 Procedure OT Evaluation $Initial OT Evaluation Tier I: 1 Procedure G-Codes:    Betsy Pries May 28, 2015, 11:34 AM

## 2015-05-28 DIAGNOSIS — R001 Bradycardia, unspecified: Secondary | ICD-10-CM

## 2015-05-28 LAB — PROTEIN C, TOTAL: Protein C, Total: 81 % (ref 70–140)

## 2015-05-28 LAB — LUPUS ANTICOAGULANT PANEL
DRVVT: 39.8 s (ref 0.0–55.1)
PTT LA: 38.2 s (ref 0.0–50.0)

## 2015-05-28 LAB — GLUCOSE, CAPILLARY: GLUCOSE-CAPILLARY: 96 mg/dL (ref 65–99)

## 2015-05-28 LAB — PROTEIN S ACTIVITY: Protein S Activity: 107 % (ref 60–145)

## 2015-05-28 LAB — CBC
HCT: 35.1 % — ABNORMAL LOW (ref 36.0–46.0)
HEMOGLOBIN: 11.4 g/dL — AB (ref 12.0–15.0)
MCH: 28.7 pg (ref 26.0–34.0)
MCHC: 32.5 g/dL (ref 30.0–36.0)
MCV: 88.4 fL (ref 78.0–100.0)
Platelets: 303 10*3/uL (ref 150–400)
RBC: 3.97 MIL/uL (ref 3.87–5.11)
RDW: 13.3 % (ref 11.5–15.5)
WBC: 4.9 10*3/uL (ref 4.0–10.5)

## 2015-05-28 LAB — BETA-2-GLYCOPROTEIN I ABS, IGG/M/A: Beta-2-Glycoprotein I IgA: <9 GPI IgA units (ref 0–25)

## 2015-05-28 LAB — PROTEIN S, TOTAL: PROTEIN S AG TOTAL: 91 % (ref 58–150)

## 2015-05-28 LAB — PROTEIN C ACTIVITY: Protein C Activity: 102 % (ref 74–151)

## 2015-05-28 MED ORDER — NICOTINE 14 MG/24HR TD PT24: 14.0000 mg | MEDICATED_PATCH | Freq: Every day | TRANSDERMAL | Status: DC

## 2015-05-28 MED ORDER — RIVAROXABAN (XARELTO) VTE STARTER PACK (15 & 20 MG)
ORAL_TABLET | ORAL | Status: DC
Start: 2015-05-28 — End: 2015-10-01

## 2015-05-28 NOTE — Progress Notes (Signed)
Discussed discharge and education with the patient. All questioned fully answered. Discharge teaching included smoking cessation, new medications, when to call MD, and follow up care. Pt sent home with Rx for Xarelto and nicotine patches. She will call me if any problems arise.  Fritz Pickerel, RN

## 2015-05-28 NOTE — Discharge Summary (Signed)
Physician Discharge Summary  Cynthia [name] ZOX:[id number] DOB: Oct 21, 1980 DOA: 05/25/2015  PCP: No PCP Per Patient  Admit date: 05/25/2015 Discharge date: 05/28/2015  Time spent: 35 minutes  Recommendations for Outpatient Follow-up:  Please follow up hypercoagulable labs results.  Needs further discussion with GYN for OC options.  Needs CBC to follow hb.   Discharge Diagnoses:    PE (pulmonary embolism)   Tobacco abuse   Sinus bradycardia   Discharge Condition: stable.   Diet recommendation: Regular.   Filed Weights   05/25/15 0428  Weight: 66.724 kg (147 lb 1.6 oz)    History of present illness:  Cynthia [name] is a 34 y.o. female with PMH of tobacco abuse, gave birth 3 months ago via C-section, currently taking OCP, who presents with chest pain.  Pt reports that this morning when she woke up she had pain over right lower chest. The pain was constant and moderate. Since about 9 PM, the pain has gotten much worse, radiating to her right neck. She states that she is taking oral contraceptives. No recent long distance traveling, no tenderness over calf areas. She has mild shortness of breath, but no cough, fever, chills, abdominal pain, nausea, vomiting, diarrhea, symptoms of UTI, unilateral weakness. No family history of blood clot. She is not currently nursing her child and does not have a history of blood disease.   In ED, patient was found to have pulmonary embolism by CTA, which showed filling defects in the distal right main pulmonary artery extending into upper and lower lobe branches consistent with acute pulmonary embolus. Small emboli in the left lower lobe pulmonary arteries. Patchy areas of consolidation in the right lung may represent areas of infarct. WBC 7.8, temperature normal, no tachycardia, electrolytes okay, lipase 16, pregnancy test negative. Patient is admitted to inpatient for further management treatment.  Hospital Course:  34 year old African-American  female with history of tobacco use, on OCP for the past 6 weeks and had a resection delivery 3 months back (has total 7 children), presented with acute onset of right-sided chest pain for 2 days worsened with deep inspiration. The pain was radiating to her shoulder blades . Denies any recent illness or travel. Denies any calf tenderness. Denies any personal or family history of blood clots. In the ED CT angina the chest showed filling defects in the distal right main pulmonary artery extending into the upper and lower lobe branches consistent with acute PE. Also showed small emboli in the left lower lobe only arteries. A patchy area of consolidation in the right lung suggested may represent areas of infarct. Patient vitals initial blood work was unremarkable. Admitted to telemetry for further management. Hypercoagulable workup sent on admission.  Assessment/Plan: Acute PE Hypercoagulable state versus secondary to contributing factors for DVT including OCP use and active smoking. -Started on IV heparin drip. Right-sided pleurisy improved. Hemodynamically stable. -Doppler lower extremities negative for DVT. 2-D echo normal EF, no right side heart strain.  --Ultracet for right-sided chest pain prn -If hypercoagulable workup negative will need anticoagulation for 6 months. Hypercoagulable work up pending.  -will transition to [employer]. tolerating xarelto. Vitals stable.  -HIV negative. ANA negative.  Patient will follow up with her GYN for further recommendation of Contraception. Advised to use condom./   Tobacco use Counseled on cessation. nicotine patch.  Sinus bradycardia Heart rate in high 50s. Asymptomatic. TSH normal. Normal electrolytes.   Procedures:  none  Consultations:  none  Discharge Exam: Filed Vitals:   05/28/15 8119  BP: 125/69  Pulse: 52  Temp: 99.2 F (37.3 C)  Resp: 16    General: Alert in no distress.  Cardiovascular: S 1, S 2 RRR  Respiratory:  CTA  Discharge Instructions   Discharge Instructions    Diet general    Complete by:  As directed      Increase activity slowly    Complete by:  As directed           Current Discharge Medication List    START taking these medications   Details  nicotine (NICODERM CQ - DOSED IN MG/24 HOURS) 14 mg/24hr patch Place 1 patch (14 mg total) onto the skin daily. Qty: 28 patch, Refills: 0    Rivaroxaban (XARELTO STARTER PACK) 15 & 20 MG TBPK Take as directed on package: Start with one 15mg  tablet by mouth twice a day with food. On Day 22, switch to one 20mg  tablet once a day with food. Qty: 51 each, Refills: 0      CONTINUE these medications which have NOT CHANGED   Details  acetaminophen (TYLENOL) 500 MG tablet Take 500-1,000 mg by mouth every 4 (four) hours as needed for mild pain, moderate pain, fever or headache.    ferrous sulfate 325 (65 FE) MG tablet Take 1 tablet (325 mg total) by mouth 2 (two) times daily with a meal. Qty: 60 tablet, Refills: 3      STOP taking these medications     TRI-PREVIFEM 0.18/0.215/0.25 MG-35 MCG tablet      ibuprofen (ADVIL,MOTRIN) 600 MG tablet      oxyCODONE-acetaminophen (PERCOCET/ROXICET) 5-325 MG per tablet        No Known Allergies    The results of significant diagnostics from this hospitalization (including imaging, microbiology, ancillary and laboratory) are listed below for reference.    Significant Diagnostic Studies: Ct Angio Chest Pe W/cm &/or Wo Cm  05/25/2015   CLINICAL DATA:  Right upper quadrant pain. Elevated D-dimer. No trauma or injury. Cesarean section 3 months ago.  EXAM: CT ANGIOGRAPHY CHEST WITH CONTRAST  TECHNIQUE: Multidetector CT imaging of the chest was performed using the standard protocol during bolus administration of intravenous contrast. Multiplanar CT image reconstructions and MIPs were obtained to evaluate the vascular anatomy.  CONTRAST:  174mL OMNIPAQUE IOHEXOL 350 MG/ML SOLN  COMPARISON:  None.   FINDINGS: Technically adequate study with moderately good opacification of the central and segmental pulmonary arteries. Filling defects are demonstrated in the distal right pulmonary artery extending into right upper lobe and lower lobe segmental vessels. Small filling defect in a left lower lobe segmental vessel. Appearance is consistent with acute pulmonary embolus.  Normal heart size. RV to LV ratio is within normal limits at 0.98. No findings to suggest right heart strain. Normal caliber thoracic aorta. Great vessel origins are patent. No significant lymphadenopathy in the chest. Esophagus is decompressed.  Patchy areas of consolidation in the right lower lung may represent focal areas of infarct. Left lung is clear. No pleural effusions. No pneumothorax.  Included portions of the upper abdominal organs are grossly unremarkable. No destructive bone lesions.  Review of the MIP images confirms the above findings.  IMPRESSION: Filling defects in the distal right main pulmonary artery extending into upper and lower lobe branches consistent with acute pulmonary embolus. Small emboli in the left lower lobe pulmonary arteries. Patchy areas of consolidation in the right lung may represent areas of infarct.  These results were called by telephone at the time of interpretation on 05/25/2015 at  3:15 am to NP [facility] , who verbally acknowledged these results.   Electronically Signed   By: Lucienne Capers M.D.   On: 05/25/2015 03:18    Microbiology: No results found for this or any previous visit (from the past 240 hour(s)).   Labs: Basic Metabolic Panel:  Recent Labs Lab 05/25/15 0107 05/25/15 0550 05/27/15 1500  NA 138 138 139  K 4.3 4.0 4.0  CL 106 108 106  CO2 25 23 28   GLUCOSE 143* 129* 88  BUN 10 8 6   CREATININE 0.78 0.67 0.71  CALCIUM 8.9 8.3* 8.6*   Liver Function Tests:  Recent Labs Lab 05/25/15 0107  AST 16  ALT 9*  ALKPHOS 67  BILITOT 0.6  PROT 7.2  ALBUMIN 3.5    Recent  Labs Lab 05/25/15 0107  LIPASE 16*   No results for input(s): AMMONIA in the last 168 hours. CBC:  Recent Labs Lab 05/25/15 0107 05/25/15 0550 05/26/15 0525 05/27/15 0514 05/28/15 0415  WBC 7.8 10.9* 5.0 5.6 4.9  HGB 13.1 11.8* 10.9* 11.1* 11.4*  HCT 38.4 35.7* 33.4* 33.7* 35.1*  MCV 87.5 87.5 88.4 88.2 88.4  PLT 284 274 261 267 303   Cardiac Enzymes: No results for input(s): CKTOTAL, CKMB, CKMBINDEX, TROPONINI in the last 168 hours. BNP: BNP (last 3 results) No results for input(s): BNP in the last 8760 hours.  ProBNP (last 3 results) No results for input(s): PROBNP in the last 8760 hours.  CBG:  Recent Labs Lab 05/25/15 0755 05/26/15 0727 05/27/15 0758 05/28/15 0739  GLUCAP 137* 88 88 96       Signed:  Regalado, Belkys A  [facility] 05/28/2015, 9:01 AM

## 2015-05-28 NOTE — Discharge Instructions (Signed)
Information on my medicine - XARELTO (rivaroxaban)  This medication education was reviewed with me or my healthcare representative as part of my discharge preparation.  The pharmacist that spoke with me during my hospital stay was:  Radin Raptis A, [place]? Xarelto was prescribed to treat blood clots that may have been found in the veins of your legs (deep vein thrombosis) or in your lungs (pulmonary embolism) and to reduce the risk of them occurring again.  What do you need to know about Xarelto? The starting dose is one 15 mg tablet taken TWICE daily with food for the FIRST 21 DAYS then on (enter date)  06/17/15 or 06/18/15 - refer to dosepack instructions  the dose is changed to one 20 mg tablet taken ONCE A DAY with your evening meal.  DO NOT stop taking Xarelto without talking to the health care provider who prescribed the medication.  Refill your prescription for 20 mg tablets before you run out.  After discharge, you should have regular check-up appointments with your healthcare provider that is prescribing your Xarelto.  In the future your dose may need to be changed if your kidney function changes by a significant amount.  What do you do if you miss a dose? If you are taking Xarelto TWICE DAILY and you miss a dose, take it as soon as you remember. You may take two 15 mg tablets (total 30 mg) at the same time then resume your regularly scheduled 15 mg twice daily the next day.  If you are taking Xarelto ONCE DAILY and you miss a dose, take it as soon as you remember on the same day then continue your regularly scheduled once daily regimen the next day. Do not take two doses of Xarelto at the same time.   Important Safety Information Xarelto is a blood thinner medicine that can cause bleeding. You should call your healthcare provider right away if you experience any of the following: ? Bleeding from an injury or your nose that does not  stop. ? Unusual colored urine (red or dark brown) or unusual colored stools (red or black). ? Unusual bruising for unknown reasons. ? A serious fall or if you hit your head (even if there is no bleeding).  Some medicines may interact with Xarelto and might increase your risk of bleeding while on Xarelto. To help avoid this, consult your healthcare provider or pharmacist prior to using any new prescription or non-prescription medications, including herbals, vitamins, non-steroidal anti-inflammatory drugs (NSAIDs) and supplements.  This website has more information on Xarelto: [email or web address]-benson.[email or web address]

## 2015-05-28 NOTE — Care Management Note (Signed)
Case Management Note  Patient Details  Name: Cynthia [name] MRN: [id number] Date of Birth: 24-Jan-1981  Subjective/Objective:                    Action/Plan:d/c home no further d/c needs or orders.   Expected Discharge Date:                  Expected Discharge Plan:  Home/Self Care  In-House Referral:     Discharge planning Services  CM Consult, Medication Assistance  Post Acute Care Choice:    Choice offered to:     DME Arranged:    DME Agency:     HH Arranged:    [place] Agency:     Status of Service:  Completed, signed off  Medicare Important Message Given:    Date Medicare IM Given:    Medicare IM give by:    Date Additional Medicare IM Given:    Additional Medicare Important Message give by:     If discussed at [place] of Stay Meetings, dates discussed:    Additional Comments:  Dessa Phi, RN 05/28/2015, 10:34 AM

## 2015-05-29 LAB — HOMOCYSTEINE: Homocysteine: 7.5 umol/L (ref 0.0–15.0)

## 2015-05-30 ENCOUNTER — Institutional Professional Consult (permissible substitution): Payer: Medicaid Other | Admitting: Internal Medicine

## 2015-05-31 ENCOUNTER — Ambulatory Visit (INDEPENDENT_AMBULATORY_CARE_PROVIDER_SITE_OTHER): Payer: Medicaid Other | Admitting: Internal Medicine

## 2015-05-31 ENCOUNTER — Encounter: Payer: Self-pay | Admitting: Internal Medicine

## 2015-05-31 VITALS — BP 118/78 | HR 68 | Ht 65.0 in | Wt 146.0 lb

## 2015-05-31 DIAGNOSIS — I2699 Other pulmonary embolism without acute cor pulmonale: Secondary | ICD-10-CM | POA: Diagnosis not present

## 2015-05-31 DIAGNOSIS — Z720 Tobacco use: Secondary | ICD-10-CM | POA: Diagnosis not present

## 2015-05-31 DIAGNOSIS — F17210 Nicotine dependence, cigarettes, uncomplicated: Secondary | ICD-10-CM

## 2015-05-31 LAB — FACTOR 5 LEIDEN

## 2015-05-31 MED ORDER — RIVAROXABAN 20 MG PO TABS: 20.0000 mg | ORAL_TABLET | Freq: Every day | ORAL | Status: DC

## 2015-05-31 NOTE — Progress Notes (Signed)
Subjective:     Patient ID: Cynthia [name], female   DOB: Dec 29, 1980,     MRN: [id number]  HPI  89 yobf active smoker s/p IUP/ c section February 27 2015 on bcp's and fine until :  Admit date: 05/25/2015 Discharge date: 05/28/2015     Recommendations for Outpatient Follow-up:  Please follow up hypercoagulable labs results.  Needs further discussion with GYN for OC options.  Needs CBC to follow hb.   Discharge Diagnoses:   PE (pulmonary embolism)  Tobacco abuse  Sinus bradycardia   Discharge Condition: stable.   Diet recommendation: Regular.   Filed Weights   05/25/15 0428  Weight: 66.724 kg (147 lb 1.6 oz)    History of present illness:  Cynthia [name] is a 33 y.o. female with PMH of tobacco abuse, gave birth 3 months ago via C-section, currently taking OCP, who presents with chest pain.  Pt reports that this morning when she woke up she had pain over right lower chest. The pain was constant and moderate. Since about 9 PM, the pain has gotten much worse, radiating to her right neck. She states that she is taking oral contraceptives. No recent long distance traveling, no tenderness over calf areas. She has mild shortness of breath, but no cough, fever, chills, abdominal pain, nausea, vomiting, diarrhea, symptoms of UTI, unilateral weakness. No family history of blood clot. She is not currently nursing her child and does not have a history of blood disease.   In ED, patient was found to have pulmonary embolism by CTA, which showed filling defects in the distal right main pulmonary artery extending into upper and lower lobe branches consistent with acute pulmonary embolus. Small emboli in the left lower lobe pulmonary arteries. Patchy areas of consolidation in the right lung may represent areas of infarct. WBC 7.8, temperature normal, no tachycardia, electrolytes okay, lipase 16, pregnancy test negative. Patient is admitted to inpatient for further management  treatment.  Hospital Course:  34 year old African-American female with history of tobacco use, on OCP for the past 6 weeks and had a resection delivery 3 months back (has total 7 children), presented with acute onset of right-sided chest pain for 2 days worsened with deep inspiration. The pain was radiating to her shoulder blades . Denies any recent illness or travel. Denies any calf tenderness. Denies any personal or family history of blood clots. In the ED CT angina the chest showed filling defects in the distal right main pulmonary artery extending into the upper and lower lobe branches consistent with acute PE. Also showed small emboli in the left lower lobe only arteries. A patchy area of consolidation in the right lung suggested may represent areas of infarct. Patient vitals initial blood work was unremarkable. Admitted to telemetry for further management. Hypercoagulable workup sent on admission.  Assessment/Plan: Acute PE Hypercoagulable state versus secondary to contributing factors for DVT including OCP use and active smoking. -Started on IV heparin drip. Right-sided pleurisy improved. Hemodynamically stable. -Doppler lower extremities negative for DVT. 2-D echo normal EF, no right side heart strain.  --Ultracet for right-sided chest pain prn -If hypercoagulable workup negative will need anticoagulation for 6 months. Hypercoagulable work up pending.  -will transition to [employer]. tolerating xarelto. Vitals stable.  -HIV negative. ANA negative.  Patient will follow up with her GYN for further recommendation of Contraception. Advised to use condom./        05/31/2015 1st [facility] office visit/ Wert   Chief Complaint  Patient presents with  .  Pulmonary Consult    Referred by Dr. Blaine Hamper. Pt recently hospitalized for PE. She states her breathing is doing well and denies any co's today.  no cp/ Not limited by breathing from desired activities    No obvious day to day or  daytime variability or assoc chronic cough or cp or chest tightness, subjective wheeze or overt sinus or hb symptoms. No unusual exp hx or h/o childhood pna/ asthma or knowledge of premature birth.  Sleeping ok without nocturnal  or early am exacerbation  of respiratory  c/o's or need for noct saba. Also denies any obvious fluctuation of symptoms with weather or environmental changes or other aggravating or alleviating factors except as outlined above   Current Medications, Allergies, Complete Past Medical History, Past Surgical History, Family History, and Social History were reviewed in [employer] record.  ROS  The following are not active complaints unless bolded sore throat, dysphagia, dental problems, itching, sneezing,  nasal congestion or excess/ purulent secretions, ear ache,   fever, chills, sweats, unintended wt loss, classically pleuritic or exertional cp, hemoptysis,  orthopnea pnd or leg swelling, presyncope, palpitations, abdominal pain, anorexia, nausea, vomiting, diarrhea  or change in bowel or bladder habits, change in stools or urine, dysuria,hematuria,  rash, arthralgias, visual complaints, headache, numbness, weakness or ataxia or problems with walking or coordination,  change in mood/affect or memory.       Review of Systems     Objective:   Physical Exam  amb bf nad   Wt Readings from Last 3 Encounters:  05/31/15 146 lb (66.225 kg)  05/25/15 147 lb 1.6 oz (66.724 kg)  02/26/15 188 lb 6 oz (85.446 kg)    Vital signs reviewed    HEENT: nl dentition, turbinates, and orophanx. Nl external ear canals without cough reflex   NECK :  without JVD/Nodes/TM/ nl carotid upstrokes bilaterally   LUNGS: no acc muscle use, clear to A and P bilaterally without cough on insp or exp maneuvers   CV:  RRR  no s3 or murmur or increase in P2, no edema   ABD:  soft and nontender with nl excursion in the supine position. No bruits or organomegaly, bowel  sounds nl  MS:  warm without deformities, calf tenderness, cyanosis or clubbing  SKIN: warm and dry without lesions    NEURO:  alert, approp, no deficits    I personally reviewed images and agree with radiology impression as follows:  CTa  05/25/15  Filling defects in the distal right main pulmonary artery extending into upper and lower lobe branches consistent with acute pulmonary embolus. Small emboli in the left lower lobe pulmonary arteries. Patchy areas of consolidation in the right lung may represent areas of infarct.     Assessment:

## 2015-05-31 NOTE — Patient Instructions (Signed)
Continue xarelto 20 mg daily after you finish the starter pack  Make appt to see Dr Daiva Huge about birth control or getting your tubes tied  Please schedule a follow up visit in 3 months but call sooner if needed

## 2015-06-01 NOTE — Assessment & Plan Note (Addendum)
Dx  05/25/15 rx hep > xarelto - venous dopplers 05/25/15 neg - Echo 05/26/15 neg RH strain   Obviously the main risk factor was recent surgery and birth control pills in the setting of active smoking. After 7 children of advised her to consider tubal ligation per Dr. Ulanda Edison or some other form of birth control but clearly cannot take birth control pills. If she does decide on tubal ligation then I would wait about 6 weeks to do it and would hold the Xarelto for 2 days. In other words, she could take a dose on Monday and have surgery on Wednesday after missing   Tuesdays and Wednesdays doses.  I've advised her to use some other means in the immediate future to prevent pregnancy as this would be ill advised on Xarelto and require transition over to Lovenox.  Discussed in detail all the  indications, usual  risks and alternatives  relative to the benefits with patient who agrees to proceed with  6 m rx with xarelto and f/u q 3 m, not need for f/u cta or echo or venous dopplers.

## 2015-06-01 NOTE — Assessment & Plan Note (Signed)

## 2015-06-03 LAB — PROTHROMBIN GENE MUTATION

## 2015-08-30 ENCOUNTER — Ambulatory Visit: Payer: Medicaid Other | Admitting: Internal Medicine

## 2015-09-19 ENCOUNTER — Other Ambulatory Visit: Payer: Self-pay | Admitting: Internal Medicine

## 2015-09-23 ENCOUNTER — Other Ambulatory Visit: Payer: Self-pay | Admitting: Internal Medicine

## 2015-09-24 ENCOUNTER — Other Ambulatory Visit: Payer: Self-pay | Admitting: Internal Medicine

## 2015-09-24 MED ORDER — RIVAROXABAN 20 MG PO TABS: ORAL_TABLET | ORAL | Status: DC

## 2015-10-01 ENCOUNTER — Ambulatory Visit (INDEPENDENT_AMBULATORY_CARE_PROVIDER_SITE_OTHER): Payer: Medicaid Other | Admitting: Internal Medicine

## 2015-10-01 ENCOUNTER — Encounter: Payer: Self-pay | Admitting: Internal Medicine

## 2015-10-01 VITALS — BP 132/86 | HR 73 | Ht 65.0 in | Wt 159.0 lb

## 2015-10-01 DIAGNOSIS — I2699 Other pulmonary embolism without acute cor pulmonale: Secondary | ICD-10-CM | POA: Diagnosis not present

## 2015-10-01 DIAGNOSIS — Z720 Tobacco use: Secondary | ICD-10-CM

## 2015-10-01 DIAGNOSIS — F17210 Nicotine dependence, cigarettes, uncomplicated: Secondary | ICD-10-CM

## 2015-10-01 MED ORDER — RIVAROXABAN 20 MG PO TABS: ORAL_TABLET | ORAL | Status: DC

## 2015-10-01 NOTE — Progress Notes (Signed)
Subjective:     Patient ID: Cynthia [name], female   DOB: 1981-09-06,     MRN: [id number]  HPI  60 yobf active smoker s/p IUP/ c section February 27 2015 on bcp's and fine until :  Admit date: 05/25/2015 Discharge date: 05/28/2015     Recommendations for Outpatient Follow-up:  Please follow up hypercoagulable labs results.  Needs further discussion with GYN for OC options.  Needs CBC to follow hb.   Discharge Diagnoses:   PE (pulmonary embolism)  Tobacco abuse  Sinus bradycardia   Discharge Condition: stable.   Diet recommendation: Regular.   Filed Weights   05/25/15 0428  Weight: 66.724 kg (147 lb 1.6 oz)    History of present illness:  Cynthia [name] is a 35 y.o. female with PMH of tobacco abuse, gave birth 3 months ago via C-section, currently taking OCP, who presents with chest pain.  Pt reports that this morning when she woke up she had pain over right lower chest. The pain was constant and moderate. Since about 9 PM, the pain has gotten much worse, radiating to her right neck. She states that she is taking oral contraceptives. No recent long distance traveling, no tenderness over calf areas. She has mild shortness of breath, but no cough, fever, chills, abdominal pain, nausea, vomiting, diarrhea, symptoms of UTI, unilateral weakness. No family history of blood clot. She is not currently nursing her child and does not have a history of blood disease.   In ED, patient was found to have pulmonary embolism by CTA, which showed filling defects in the distal right main pulmonary artery extending into upper and lower lobe branches consistent with acute pulmonary embolus. Small emboli in the left lower lobe pulmonary arteries. Patchy areas of consolidation in the right lung may represent areas of infarct. WBC 7.8, temperature normal, no tachycardia, electrolytes okay, lipase 16, pregnancy test negative. Patient is admitted to inpatient for further management  treatment.  Hospital Course:  35 year old African-American female with history of tobacco use, on OCP for the past 6 weeks and had a resection delivery 3 months back (has total 7 children), presented with acute onset of right-sided chest pain for 2 days worsened with deep inspiration. The pain was radiating to her shoulder blades . Denies any recent illness or travel. Denies any calf tenderness. Denies any personal or family history of blood clots. In the ED CT angina the chest showed filling defects in the distal right main pulmonary artery extending into the upper and lower lobe branches consistent with acute PE. Also showed small emboli in the left lower lobe only arteries. A patchy area of consolidation in the right lung suggested may represent areas of infarct. Patient vitals initial blood work was unremarkable. Admitted to telemetry for further management. Hypercoagulable workup sent on admission.  Assessment/Plan: Acute PE Hypercoagulable state versus secondary to contributing factors for DVT including OCP use and active smoking. -Started on IV heparin drip. Right-sided pleurisy improved. Hemodynamically stable. -Doppler lower extremities negative for DVT. 2-D echo normal EF, no right side heart strain.  --Ultracet for right-sided chest pain prn -If hypercoagulable workup negative will need anticoagulation for 6 months. Hypercoagulable work up pending.  -will transition to [employer]. tolerating xarelto. Vitals stable.  -HIV negative. ANA negative.  Patient will follow up with her GYN for further recommendation of Contraception. Advised to use condom./        05/31/2015 1st [facility] office visit/ Dima Mini   Chief Complaint  Patient presents with  .  Pulmonary Consult    Referred by Dr. Blaine Hamper. Pt recently hospitalized for PE. She states her breathing is doing well and denies any co's today.  no cp/ Not limited by breathing from desired activities   rec Continue xarelto 20 mg  daily after you finish the starter pack Make appt to see Dr Daiva Huge about birth control or getting your tubes tied> did not do    10/01/2015  f/u ov/Franceen Erisman re: refill on xarelto Chief Complaint  Patient presents with  . Follow-up    Breathing is doing well.  No new co's today.  Not limited by breathing from desired activities      No obvious day to day or daytime variability or assoc chronic cough or cp or chest tightness, subjective wheeze or overt sinus or hb symptoms. No unusual exp hx or h/o childhood pna/ asthma or knowledge of premature birth.  Sleeping ok without nocturnal  or early am exacerbation  of respiratory  c/o's or need for noct saba. Also denies any obvious fluctuation of symptoms with weather or environmental changes or other aggravating or alleviating factors except as outlined above   Current Medications, Allergies, Complete Past Medical History, Past Surgical History, Family History, and Social History were reviewed in [employer] record.  ROS  The following are not active complaints unless bolded sore throat, dysphagia, dental problems, itching, sneezing,  nasal congestion or excess/ purulent secretions, ear ache,   fever, chills, sweats, unintended wt loss, classically pleuritic or exertional cp, hemoptysis,  orthopnea pnd or leg swelling, presyncope, palpitations, abdominal pain, anorexia, nausea, vomiting, diarrhea  or change in bowel or bladder habits, change in stools or urine, dysuria,hematuria,  rash, arthralgias, visual complaints, headache, numbness, weakness or ataxia or problems with walking or coordination,  change in mood/affect or memory.       Review of Systems     Objective:   Physical Exam  amb bf nad/ somewhat of a childlike affect    10/01/2015       159  05/31/15 146 lb (66.225 kg)  05/25/15 147 lb 1.6 oz (66.724 kg)  02/26/15 188 lb 6 oz (85.446 kg)    Vital signs reviewed    HEENT: nl dentition, turbinates, and  orophanx. Nl external ear canals without cough reflex   NECK :  without JVD/Nodes/TM/ nl carotid upstrokes bilaterally   LUNGS: no acc muscle use, clear to A and P bilaterally without cough on insp or exp maneuvers   CV:  RRR  no s3 or murmur or increase in P2, no edema   ABD:  soft and nontender with nl excursion in the supine position. No bruits or organomegaly, bowel sounds nl  MS:  warm without deformities, calf tenderness, cyanosis or clubbing  SKIN: warm and dry without lesions    NEURO:  alert, approp, no deficits    I personally reviewed images and agree with radiology impression as follows:  CTa  05/25/15  Filling defects in the distal right main pulmonary artery extending into upper and lower lobe branches consistent with acute pulmonary embolus. Small emboli in the left lower lobe pulmonary arteries. Patchy areas of consolidation in the right lung may represent areas of infarct.     Assessment:

## 2015-10-01 NOTE — Assessment & Plan Note (Signed)
Dx  05/25/15 on bcps post partum >> rx hep > xarelto> to complete on 12/14/15  - venous dopplers 05/25/15 neg - Echo 05/26/15 neg RH strain  I had an extended final summary discussion with the patient reviewing all relevant studies completed to date and  lasting 15 to 20 minutes of a 25 minute visit on the following issues:    With nl echo/ legs no need to do f/u studies and 6 m rx is adequate  Advised would be high risk for IUP or resuming bcps > rec return to GYN asap   No need for anticoagulation beyond 3 months

## 2015-10-01 NOTE — Assessment & Plan Note (Signed)

## 2015-10-01 NOTE — Patient Instructions (Addendum)
The key is to stop smoking completely before smoking completely stops you!   Finish xarelto end of March 2016  See Dr Ulanda Edison re birth control - you would be high risk for pregnancy or being placed back on birth control pills  Follow up here as needed

## 2015-11-20 ENCOUNTER — Other Ambulatory Visit: Payer: Self-pay | Admitting: Internal Medicine

## 2018-01-12 LAB — OB RESULTS CONSOLE GC/CHLAMYDIA
Chlamydia: NEGATIVE
Gonorrhea: NEGATIVE

## 2018-01-12 LAB — OB RESULTS CONSOLE RUBELLA ANTIBODY, IGM: Rubella: IMMUNE

## 2018-01-12 LAB — OB RESULTS CONSOLE HIV ANTIBODY (ROUTINE TESTING): HIV: NONREACTIVE

## 2018-01-12 LAB — OB RESULTS CONSOLE ABO/RH

## 2018-01-12 LAB — OB RESULTS CONSOLE RPR: RPR: NONREACTIVE

## 2018-01-12 LAB — OB RESULTS CONSOLE HEPATITIS B SURFACE ANTIGEN: HEP B S AG: NEGATIVE

## 2018-07-18 ENCOUNTER — Encounter (HOSPITAL_COMMUNITY): Payer: Self-pay | Admitting: Obstetrics and Gynecology

## 2018-07-18 DIAGNOSIS — Z3483 Encounter for supervision of other normal pregnancy, third trimester: Secondary | ICD-10-CM

## 2018-07-18 DIAGNOSIS — Z3490 Encounter for supervision of normal pregnancy, unspecified, unspecified trimester: Secondary | ICD-10-CM

## 2018-07-18 HISTORY — DX: Encounter for supervision of other normal pregnancy, third trimester: Z34.83

## 2018-07-19 ENCOUNTER — Encounter (HOSPITAL_COMMUNITY): Payer: Self-pay | Admitting: *Deleted

## 2018-08-02 ENCOUNTER — Encounter (HOSPITAL_COMMUNITY)
Admission: RE | Admit: 2018-08-02 | Discharge: 2018-08-02 | Disposition: A | Discharge status: Home / Self Care | Payer: Medicaid Other | Source: Ambulatory Visit | Attending: Obstetrics and Gynecology | Admitting: Obstetrics and Gynecology

## 2018-08-02 HISTORY — DX: Personal history of pulmonary embolism: Z86.711

## 2018-08-02 LAB — CBC
HCT: 32.6 % — ABNORMAL LOW (ref 36.0–46.0)
Hemoglobin: 10.8 g/dL — ABNORMAL LOW (ref 12.0–15.0)
MCH: 29.9 pg (ref 26.0–34.0)
MCHC: 33.1 g/dL (ref 30.0–36.0)
MCV: 90.3 fL (ref 80.0–100.0)
NRBC: 0 % (ref 0.0–0.2)
PLATELETS: 225 10*3/uL (ref 150–400)
RBC: 3.61 MIL/uL — ABNORMAL LOW (ref 3.87–5.11)
RDW: 14.5 % (ref 11.5–15.5)
WBC: 8.1 10*3/uL (ref 4.0–10.5)

## 2018-08-02 LAB — TYPE AND SCREEN
ABO/RH(D): A POS
ANTIBODY SCREEN: NEGATIVE

## 2018-08-02 NOTE — Anesthesia Preprocedure Evaluation (Addendum)
Anesthesia Evaluation  Patient identified by MRN, date of birth, ID band Patient awake    Reviewed: Allergy & Precautions, NPO status , Patient's Chart, lab work & pertinent test results  History of Anesthesia Complications Negative for: history of anesthetic complications  Airway Mallampati: II  TM Distance: >3 FB Neck ROM: Full    Dental no notable dental hx. (+) Teeth Intact, Dental Advisory Given   Pulmonary Current Smoker,  Hx of PE, on lovenox (last dose 07/28/18)   Pulmonary exam normal breath sounds clear to auscultation       Cardiovascular negative cardio ROS Normal cardiovascular exam Rhythm:Regular Rate:Normal     Neuro/Psych negative neurological ROS     GI/Hepatic negative GI ROS, Neg liver ROS,   Endo/Other  negative endocrine ROS  Renal/GU negative Renal ROS     Musculoskeletal negative musculoskeletal ROS (+)   Abdominal   Peds  Hematology negative hematology ROS (+)   Anesthesia Other Findings Day of surgery medications reviewed with the patient.  Reproductive/Obstetrics (+) Pregnancy Hx of C/S x3                            Anesthesia Physical Anesthesia Plan  ASA: II  Anesthesia Plan: Spinal   Post-op Pain Management:    Induction:   PONV Risk Score and Plan: 1 and Ondansetron, Dexamethasone and Treatment may vary due to age or medical condition  Airway Management Planned: Natural Airway  Additional Equipment:   Intra-op Plan:   Post-operative Plan:   Informed Consent: I have reviewed the patients History and Physical, chart, labs and discussed the procedure including the risks, benefits and alternatives for the proposed anesthesia with the patient or authorized representative who has indicated his/her understanding and acceptance.   Dental advisory given  Plan Discussed with: CRNA  Anesthesia Plan Comments:        Anesthesia Quick  Evaluation

## 2018-08-02 NOTE — H&P (Signed)
Cynthia [name] is a 37 y.o. female [phone] at 41 week for rLTCS.  Her pregnancy has been complicated by AMA, history of PE, tobacco use and h/o LTCS x 3.  She declined Tdap and FLu.  Her pregnancy is dated by an early [place].  She has been receiving lovenox - but has held for epidural placement.  Also has a low risk panorama - female infant.    OB History    Gravida  8   Para  7   Term  7   Preterm      AB      Living  7     SAB      TAB      Ectopic      Multiple  0   Live Births  7         SVD x 4, LTCS x 3; h/o PIH with G1.  No abn pap last 2/16 No STD PE from OCP use  Past Medical History:  Diagnosis Date  . History of pulmonary embolism   . Normal pregnancy in multigravida in third trimester 07/18/2018  . Tobacco abuse    Past Surgical History:  Procedure Laterality Date  . CESAREAN SECTION    . CESAREAN SECTION N/A 02/27/2015   Procedure: CESAREAN SECTION;  Surgeon: Newton Pigg, MD;  Location: [facility];  Service: Obstetrics;  Laterality: N/A;   Family History: family history includes Hypertension in her father and mother. Social History:  reports that she has been smoking cigarettes. She has a 6.50 pack-year smoking history. She has never used smokeless tobacco. She reports that she does not drink alcohol or use drugs. SAHM, long term relationship x 67yr  Meds: PNV, lovenox, iron All: NKDA    Maternal Diabetes: No Genetic Screening: Normal Maternal Ultrasounds/Referrals: Normal Fetal Ultrasounds or other Referrals:  None Maternal Substance Abuse:  No Significant Maternal Medications:  Meds include: Other: Lovenox Significant Maternal Lab Results:  Lab values include: Group B Strep negative Other Comments:  None  Review of Systems  Constitutional: Negative.   HENT: Negative.   Eyes: Negative.   Respiratory: Negative.   Cardiovascular: Negative.   Gastrointestinal: Positive for abdominal pain.  Genitourinary: Negative.   Musculoskeletal:  Positive for back pain.  Skin: Negative.   Neurological: Negative.   Psychiatric/Behavioral: Negative.    Maternal Medical History:  Contractions: Frequency: irregular.    Fetal activity: Perceived fetal activity is normal.    Prenatal Complications - Diabetes: none.      Last menstrual period 10/24/2017, unknown if currently breastfeeding. Maternal Exam:  Abdomen: Patient reports no abdominal tenderness. Surgical scars: low transverse.   Fundal height is appropriate for gestation.   Estimated fetal weight is 7-8#.   Fetal presentation: vertex  Introitus: Normal vulva. Normal vagina.    Physical Exam  Constitutional: She is oriented to person, place, and time. She appears well-developed and well-nourished.  HENT:  Head: Normocephalic and atraumatic.  Cardiovascular: Normal rate and regular rhythm.  Respiratory: Effort normal and breath sounds normal. No respiratory distress. She has no wheezes.  GI: Soft. Bowel sounds are normal. She exhibits no distension. There is no tenderness.  Musculoskeletal: Normal range of motion.  Neurological: She is alert and oriented to person, place, and time.  Skin: Skin is warm and dry.  Psychiatric: She has a normal mood and affect. Her behavior is normal.    Prenatal labs: ABO, Rh: --/--/A POS (11/19 8299) Antibody: NEG (11/19 0959) Rubella: Immune (05/01 0000)  RPR: Nonreactive (05/01 0000)  HBsAg: Negative (05/01 0000)  HIV: Non-reactive (05/01 0000)  GBS:   negative  AMA - nl low risk panorama, female; nl Hgb electro, Tdap and flu declined, Hgb 12.0/Plt 300, Ur Cx neg/Chl neg/GC neg/Varicella immune/TSH WNL/nl NT; glucola 103 Nl anat, female, post plac  Assessment/Plan: 37yo [id number] at 39+ for rLTCS Has previously had LTCS x 3, declines BTL D/w pt r/b/a of rLTCS Ancef for prophylaxis   Peyton Spengler Bovard-Stuckert 08/02/2018, 7:49 PM

## 2018-08-02 NOTE — Patient Instructions (Signed)
Cynthia [name]  08/02/2018   Your procedure is scheduled on:  08/03/2018  Enter through the Main Entrance of [facility] at [place] up the phone at the desk and dial [phone]  Call this number if you have problems the morning of surgery:[phone]  Remember:   Do not eat food:(After Midnight) Desps de medianoche.  Do not drink clear liquids: (After Midnight) Desps de medianoche.  Take these medicines the morning of surgery with A SIP OF WATER: lovenox   Do not wear jewelry, make-up or nail polish.  Do not wear lotions, powders, or perfumes. Do not wear deodorant.  Do not shave 48 hours prior to surgery.  Do not bring valuables to the hospital.  [facility] is not   responsible for any belongings or valuables brought to the hospital.  Contacts, dentures or bridgework may not be worn into surgery.  Leave suitcase in the car. After surgery it may be brought to your room.  For patients admitted to the hospital, checkout time is 11:00 AM the day of              discharge.    N/A   Please read over the following fact sheets that you were given:   Surgical Site Infection Prevention

## 2018-08-03 ENCOUNTER — Other Ambulatory Visit: Payer: Self-pay

## 2018-08-03 ENCOUNTER — Inpatient Hospital Stay (HOSPITAL_COMMUNITY)
Admission: RE | Admit: 2018-08-03 | Discharge: 2018-08-05 | DRG: 788 | Disposition: A | Discharge status: Home / Self Care | Payer: Medicaid Other | Attending: Obstetrics and Gynecology | Admitting: Obstetrics and Gynecology

## 2018-08-03 ENCOUNTER — Inpatient Hospital Stay (HOSPITAL_COMMUNITY): Payer: Medicaid Other | Admitting: Anesthesiology

## 2018-08-03 ENCOUNTER — Encounter (HOSPITAL_COMMUNITY): Payer: Self-pay | Admitting: *Deleted

## 2018-08-03 ENCOUNTER — Encounter (HOSPITAL_COMMUNITY)
Admission: RE | Disposition: A | Discharge status: Home / Self Care | Payer: Self-pay | Source: Home / Self Care | Attending: Obstetrics and Gynecology

## 2018-08-03 DIAGNOSIS — Z86711 Personal history of pulmonary embolism: Secondary | ICD-10-CM | POA: Diagnosis not present

## 2018-08-03 DIAGNOSIS — Z98891 History of uterine scar from previous surgery: Secondary | ICD-10-CM

## 2018-08-03 DIAGNOSIS — Z3A39 39 weeks gestation of pregnancy: Secondary | ICD-10-CM | POA: Diagnosis not present

## 2018-08-03 DIAGNOSIS — F17210 Nicotine dependence, cigarettes, uncomplicated: Secondary | ICD-10-CM | POA: Diagnosis present

## 2018-08-03 DIAGNOSIS — Z86718 Personal history of other venous thrombosis and embolism: Secondary | ICD-10-CM | POA: Diagnosis not present

## 2018-08-03 DIAGNOSIS — O99334 Smoking (tobacco) complicating childbirth: Secondary | ICD-10-CM | POA: Diagnosis present

## 2018-08-03 DIAGNOSIS — O34211 Maternal care for low transverse scar from previous cesarean delivery: Secondary | ICD-10-CM | POA: Diagnosis present

## 2018-08-03 DIAGNOSIS — Z3490 Encounter for supervision of normal pregnancy, unspecified, unspecified trimester: Secondary | ICD-10-CM

## 2018-08-03 DIAGNOSIS — Z3483 Encounter for supervision of other normal pregnancy, third trimester: Secondary | ICD-10-CM

## 2018-08-03 HISTORY — DX: Encounter for supervision of other normal pregnancy, third trimester: Z34.83

## 2018-08-03 LAB — RPR: RPR: NONREACTIVE

## 2018-08-03 SURGERY — Surgical Case
Anesthesia: Spinal | Wound class: Clean Contaminated

## 2018-08-03 MED ORDER — MORPHINE SULFATE (PF) 0.5 MG/ML IJ SOLN
INTRAMUSCULAR | Status: AC
  Filled 2018-08-03: qty 10

## 2018-08-03 MED ORDER — CEFAZOLIN SODIUM-DEXTROSE 2-4 GM/100ML-% IV SOLN
2.0000 g | INTRAVENOUS | Status: AC
  Administered 2018-08-03: 2 g via INTRAVENOUS
  Filled 2018-08-03: qty 100

## 2018-08-03 MED ORDER — DIPHENHYDRAMINE HCL 25 MG PO CAPS: 25.0000 mg | ORAL_CAPSULE | Freq: Four times a day (QID) | ORAL | Status: DC | PRN

## 2018-08-03 MED ORDER — ENOXAPARIN SODIUM 40 MG/0.4ML ~~LOC~~ SOLN
40.0000 mg | SUBCUTANEOUS | Status: DC
  Administered 2018-08-04 – 2018-08-05 (×2): 40 mg via SUBCUTANEOUS
  Filled 2018-08-03 (×2): qty 0.4

## 2018-08-03 MED ORDER — SENNOSIDES-DOCUSATE SODIUM 8.6-50 MG PO TABS
2.0000 | ORAL_TABLET | ORAL | Status: DC
  Administered 2018-08-04 (×2): 2 via ORAL
  Filled 2018-08-03 (×2): qty 2

## 2018-08-03 MED ORDER — OXYCODONE HCL 5 MG PO TABS: 10.0000 mg | ORAL_TABLET | ORAL | Status: DC | PRN

## 2018-08-03 MED ORDER — ACETAMINOPHEN 325 MG PO TABS
650.0000 mg | ORAL_TABLET | ORAL | Status: DC | PRN
  Administered 2018-08-04 (×2): 650 mg via ORAL
  Filled 2018-08-03 (×2): qty 2

## 2018-08-03 MED ORDER — KETOROLAC TROMETHAMINE 30 MG/ML IJ SOLN: 30.0000 mg | Freq: Four times a day (QID) | INTRAMUSCULAR | Status: AC | PRN

## 2018-08-03 MED ORDER — SOD CITRATE-CITRIC ACID 500-334 MG/5ML PO SOLN
30.0000 mL | Freq: Once | ORAL | Status: AC
  Administered 2018-08-03: 30 mL via ORAL
  Filled 2018-08-03: qty 15

## 2018-08-03 MED ORDER — PRENATAL MULTIVITAMIN CH
1.0000 | ORAL_TABLET | Freq: Every day | ORAL | Status: DC
  Administered 2018-08-04 – 2018-08-05 (×2): 1 via ORAL
  Filled 2018-08-03 (×2): qty 1

## 2018-08-03 MED ORDER — SODIUM CHLORIDE 0.9 % IR SOLN
Status: DC | PRN
  Administered 2018-08-03: 1000 mL

## 2018-08-03 MED ORDER — OXYTOCIN 10 UNIT/ML IJ SOLN
INTRAMUSCULAR | Status: AC
  Filled 2018-08-03: qty 4

## 2018-08-03 MED ORDER — OXYTOCIN 40 UNITS IN LACTATED RINGERS INFUSION - SIMPLE MED: 2.5000 [IU]/h | INTRAVENOUS | Status: AC

## 2018-08-03 MED ORDER — FENTANYL CITRATE (PF) 100 MCG/2ML IJ SOLN
INTRAMUSCULAR | Status: AC
  Filled 2018-08-03: qty 2

## 2018-08-03 MED ORDER — MORPHINE SULFATE (PF) 0.5 MG/ML IJ SOLN: INTRAMUSCULAR | Status: DC | PRN

## 2018-08-03 MED ORDER — BUPIVACAINE IN DEXTROSE 0.75-8.25 % IT SOLN
INTRATHECAL | Status: DC | PRN
  Administered 2018-08-03: 1.8 mL via INTRATHECAL

## 2018-08-03 MED ORDER — OXYTOCIN 10 UNIT/ML IJ SOLN
INTRAVENOUS | Status: DC | PRN
  Administered 2018-08-03: 40 [IU] via INTRAVENOUS

## 2018-08-03 MED ORDER — NALBUPHINE HCL 10 MG/ML IJ SOLN: 5.0000 mg | Freq: Once | INTRAMUSCULAR | Status: DC | PRN

## 2018-08-03 MED ORDER — FENTANYL CITRATE (PF) 100 MCG/2ML IJ SOLN: 25.0000 ug | INTRAMUSCULAR | Status: DC | PRN

## 2018-08-03 MED ORDER — ZOLPIDEM TARTRATE 5 MG PO TABS: 5.0000 mg | ORAL_TABLET | Freq: Every evening | ORAL | Status: DC | PRN

## 2018-08-03 MED ORDER — OXYCODONE HCL 5 MG PO TABS
5.0000 mg | ORAL_TABLET | ORAL | Status: DC | PRN
  Administered 2018-08-05 (×2): 5 mg via ORAL
  Filled 2018-08-03 (×2): qty 1

## 2018-08-03 MED ORDER — MENTHOL 3 MG MT LOZG: 1.0000 | LOZENGE | OROMUCOSAL | Status: DC | PRN

## 2018-08-03 MED ORDER — DIPHENHYDRAMINE HCL 50 MG/ML IJ SOLN: 12.5000 mg | INTRAMUSCULAR | Status: DC | PRN

## 2018-08-03 MED ORDER — PROMETHAZINE HCL 25 MG/ML IJ SOLN: 6.2500 mg | INTRAMUSCULAR | Status: DC | PRN

## 2018-08-03 MED ORDER — FENTANYL CITRATE (PF) 100 MCG/2ML IJ SOLN
INTRAMUSCULAR | Status: DC | PRN
  Administered 2018-08-03: 15 ug via INTRATHECAL

## 2018-08-03 MED ORDER — NALBUPHINE HCL 10 MG/ML IJ SOLN
INTRAMUSCULAR | Status: AC
  Administered 2018-08-03: 5 mg via SUBCUTANEOUS
  Filled 2018-08-03: qty 1

## 2018-08-03 MED ORDER — NALOXONE HCL 0.4 MG/ML IJ SOLN: 0.4000 mg | INTRAMUSCULAR | Status: DC | PRN

## 2018-08-03 MED ORDER — SIMETHICONE 80 MG PO CHEW
80.0000 mg | CHEWABLE_TABLET | Freq: Three times a day (TID) | ORAL | Status: DC
  Administered 2018-08-03 – 2018-08-05 (×6): 80 mg via ORAL
  Filled 2018-08-03 (×5): qty 1

## 2018-08-03 MED ORDER — WITCH HAZEL-GLYCERIN EX PADS: 1.0000 "application " | MEDICATED_PAD | CUTANEOUS | Status: DC | PRN

## 2018-08-03 MED ORDER — NALBUPHINE HCL 10 MG/ML IJ SOLN: 5.0000 mg | INTRAMUSCULAR | Status: DC | PRN

## 2018-08-03 MED ORDER — LACTATED RINGERS IV SOLN
INTRAVENOUS | Status: DC
  Administered 2018-08-03 (×3): via INTRAVENOUS

## 2018-08-03 MED ORDER — ACETAMINOPHEN 500 MG PO TABS
1000.0000 mg | ORAL_TABLET | Freq: Four times a day (QID) | ORAL | Status: AC
  Administered 2018-08-03 – 2018-08-04 (×3): 1000 mg via ORAL
  Filled 2018-08-03 (×3): qty 2

## 2018-08-03 MED ORDER — SIMETHICONE 80 MG PO CHEW
80.0000 mg | CHEWABLE_TABLET | ORAL | Status: DC
  Administered 2018-08-04 (×2): 80 mg via ORAL
  Filled 2018-08-03 (×3): qty 1

## 2018-08-03 MED ORDER — ONDANSETRON HCL 4 MG/2ML IJ SOLN
INTRAMUSCULAR | Status: DC | PRN
  Administered 2018-08-03: 4 mg via INTRAVENOUS

## 2018-08-03 MED ORDER — TETANUS-DIPHTH-ACELL PERTUSSIS 5-2.5-18.5 LF-MCG/0.5 IM SUSP: 0.5000 mL | Freq: Once | INTRAMUSCULAR | Status: DC

## 2018-08-03 MED ORDER — KETOROLAC TROMETHAMINE 30 MG/ML IJ SOLN: 30.0000 mg | Freq: Once | INTRAMUSCULAR | Status: DC | PRN

## 2018-08-03 MED ORDER — PHENYLEPHRINE 8 MG IN D5W 100 ML (0.08MG/ML) PREMIX OPTIME
INJECTION | INTRAVENOUS | Status: AC
  Filled 2018-08-03: qty 100

## 2018-08-03 MED ORDER — IBUPROFEN 800 MG PO TABS
800.0000 mg | ORAL_TABLET | Freq: Three times a day (TID) | ORAL | Status: DC
  Administered 2018-08-03 – 2018-08-05 (×6): 800 mg via ORAL
  Filled 2018-08-03 (×6): qty 1

## 2018-08-03 MED ORDER — SODIUM CHLORIDE 0.9% FLUSH: 3.0000 mL | INTRAVENOUS | Status: DC | PRN

## 2018-08-03 MED ORDER — SUCCINYLCHOLINE CHLORIDE 200 MG/10ML IV SOSY
PREFILLED_SYRINGE | INTRAVENOUS | Status: AC
  Filled 2018-08-03: qty 10

## 2018-08-03 MED ORDER — MORPHINE SULFATE-NACL 0.5-0.9 MG/ML-% IV SOSY
PREFILLED_SYRINGE | INTRAVENOUS | Status: DC | PRN
  Administered 2018-08-03: .15 mg via EPIDURAL

## 2018-08-03 MED ORDER — LACTATED RINGERS IV SOLN
INTRAVENOUS | Status: DC
  Administered 2018-08-03: 20:00:00 via INTRAVENOUS

## 2018-08-03 MED ORDER — DIBUCAINE 1 % RE OINT: 1.0000 "application " | TOPICAL_OINTMENT | RECTAL | Status: DC | PRN

## 2018-08-03 MED ORDER — ONDANSETRON HCL 4 MG/2ML IJ SOLN
INTRAMUSCULAR | Status: AC
  Filled 2018-08-03: qty 2

## 2018-08-03 MED ORDER — ONDANSETRON HCL 4 MG/2ML IJ SOLN: 4.0000 mg | Freq: Three times a day (TID) | INTRAMUSCULAR | Status: DC | PRN

## 2018-08-03 MED ORDER — STERILE WATER FOR IRRIGATION IR SOLN
Status: DC | PRN
  Administered 2018-08-03: 1000 mL

## 2018-08-03 MED ORDER — SIMETHICONE 80 MG PO CHEW: 80.0000 mg | CHEWABLE_TABLET | ORAL | Status: DC | PRN

## 2018-08-03 MED ORDER — KETOROLAC TROMETHAMINE 30 MG/ML IJ SOLN
INTRAMUSCULAR | Status: AC
  Administered 2018-08-03: 30 mg via INTRAMUSCULAR
  Filled 2018-08-03: qty 1

## 2018-08-03 MED ORDER — PHENYLEPHRINE 8 MG IN D5W 100 ML (0.08MG/ML) PREMIX OPTIME
INJECTION | INTRAVENOUS | Status: DC | PRN
  Administered 2018-08-03: 60 ug/min via INTRAVENOUS

## 2018-08-03 MED ORDER — DIPHENHYDRAMINE HCL 25 MG PO CAPS
25.0000 mg | ORAL_CAPSULE | ORAL | Status: DC | PRN
  Filled 2018-08-03: qty 1

## 2018-08-03 MED ORDER — COCONUT OIL OIL: 1.0000 "application " | TOPICAL_OIL | Status: DC | PRN

## 2018-08-03 MED ORDER — NALOXONE HCL 4 MG/10ML IJ SOLN
1.0000 ug/kg/h | INTRAVENOUS | Status: DC | PRN
  Filled 2018-08-03: qty 5

## 2018-08-03 SURGICAL SUPPLY — 31 items
BENZOIN TINCTURE PRP APPL 2/3 (GAUZE/BANDAGES/DRESSINGS) ×3 IMPLANT
CHLORAPREP W/TINT 26ML (MISCELLANEOUS) ×3 IMPLANT
CLAMP CORD UMBIL (MISCELLANEOUS) ×3 IMPLANT
CLOSURE WOUND 1/2 X4 (GAUZE/BANDAGES/DRESSINGS) ×1
CLOTH BEACON ORANGE TIMEOUT ST (SAFETY) ×3 IMPLANT
DRSG OPSITE POSTOP 4X10 (GAUZE/BANDAGES/DRESSINGS) ×3 IMPLANT
ELECT REM PT RETURN 9FT ADLT (ELECTROSURGICAL) ×3
ELECTRODE REM PT RTRN 9FT ADLT (ELECTROSURGICAL) ×1 IMPLANT
GLOVE BIO SURGEON STRL SZ 6.5 (GLOVE) ×2 IMPLANT
GLOVE BIO SURGEONS STRL SZ 6.5 (GLOVE) ×1
GLOVE BIOGEL PI IND STRL 7.0 (GLOVE) ×1 IMPLANT
GLOVE BIOGEL PI INDICATOR 7.0 (GLOVE) ×2
GOWN STRL REUS W/TWL LRG LVL3 (GOWN DISPOSABLE) ×6 IMPLANT
NS IRRIG 1000ML POUR BTL (IV SOLUTION) ×3 IMPLANT
PACK C SECTION WH (CUSTOM PROCEDURE TRAY) ×3 IMPLANT
PAD OB MATERNITY 4.3X12.25 (PERSONAL CARE ITEMS) ×3 IMPLANT
PENCIL SMOKE EVAC W/HOLSTER (ELECTROSURGICAL) ×3 IMPLANT
RTRCTR C-SECT PINK 25CM LRG (MISCELLANEOUS) ×3 IMPLANT
SPONGE LAP 18X18 RF (DISPOSABLE) ×9 IMPLANT
STRIP CLOSURE SKIN 1/2X4 (GAUZE/BANDAGES/DRESSINGS) ×2 IMPLANT
SUT MNCRL 0 VIOLET CTX 36 (SUTURE) ×2 IMPLANT
SUT MONOCRYL 0 CTX 36 (SUTURE) ×4
SUT PLAIN 2 0 XLH (SUTURE) ×3 IMPLANT
SUT VIC AB 0 CT1 27 (SUTURE) ×4
SUT VIC AB 0 CT1 27XBRD ANBCTR (SUTURE) ×2 IMPLANT
SUT VIC AB 2-0 CT1 27 (SUTURE) ×2
SUT VIC AB 2-0 CT1 TAPERPNT 27 (SUTURE) ×1 IMPLANT
SUT VIC AB 4-0 KS 27 (SUTURE) ×3 IMPLANT
SYR BULB IRRIGATION 50ML (SYRINGE) ×3 IMPLANT
TOWEL OR 17X24 6PK STRL BLUE (TOWEL DISPOSABLE) ×3 IMPLANT
TRAY FOLEY W/BAG SLVR 14FR LF (SET/KITS/TRAYS/PACK) ×3 IMPLANT

## 2018-08-03 NOTE — Interval H&P Note (Signed)
History and Physical Interval Note:  08/03/2018 10:30 AM  Cynthia [name]  has presented today for surgery, with the diagnosis of repeat C-Section, on Lovenox  The various methods of treatment have been discussed with the patient and family. After consideration of risks, benefits and other options for treatment, the patient has consented to  Procedure(s) with comments: [place] (N/A) - [name], RNFA as a surgical intervention .  The patient's history has been reviewed, patient examined, no change in status, stable for surgery.  I have reviewed the patient's chart and labs.  Questions were answered to the patient's satisfaction.     Alec Jaros Bovard-Stuckert

## 2018-08-03 NOTE — Anesthesia Procedure Notes (Addendum)
Spinal  Patient location during procedure: OR Start time: 08/03/2018 10:50 AM End time: 08/03/2018 10:53 AM Staffing Anesthesiologist: [name], MD Performed: anesthesiologist  Preanesthetic Checklist Completed: patient identified, site marked, pre-op evaluation, timeout performed, IV checked, risks and benefits discussed and monitors and equipment checked Spinal Block Patient position: sitting Prep: ChloraPrep Patient monitoring: heart rate, cardiac monitor and continuous pulse ox Approach: midline Location: L3-4 Injection technique: single-shot Needle Needle type: Pencan  Needle gauge: 24 G Needle length: 10 cm Assessment Sensory level: T6 Additional Notes Risks, benefits, and alternative discussed. Patient gave consent to procedure. Prepped and draped in sitting position. First attempt at L3-4 with persistent os encountered. Second attempt at L4-5 with one needle redirection. Clear CSF return. Positive terminal aspiration. No pain or paraesthesias with injection. Patient tolerated procedure well. Vital signs stable. Tawny Asal, MD

## 2018-08-03 NOTE — Anesthesia Postprocedure Evaluation (Signed)
Anesthesia Post Note  Patient: Cynthia [name]  Procedure(s) Performed: REPEAT CESAREAN SECTION (N/A )     Patient location during evaluation: PACU Anesthesia Type: Spinal Level of consciousness: awake and alert Pain management: pain level controlled Vital Signs Assessment: post-procedure vital signs reviewed and stable Respiratory status: spontaneous breathing, nonlabored ventilation and respiratory function stable Cardiovascular status: blood pressure returned to baseline and stable Postop Assessment: no apparent nausea or vomiting and spinal receding Anesthetic complications: no    Last Vitals:  Vitals:   08/03/18 1217 08/03/18 1218  BP:    Pulse: 61 (!) 58  Resp: (!) 9 12  Temp:    SpO2: 100% 100%    Last Pain:  Vitals:   08/03/18 0825  TempSrc: Oral   Pain Goal:                 [name]

## 2018-08-03 NOTE — Brief Op Note (Signed)
08/03/2018  11:55 AM  PATIENT:  Cynthia [name]  37 y.o. female  PRE-OPERATIVE DIAGNOSIS:  repeat C-Section, on Lovenox  POST-OPERATIVE DIAGNOSIS:  repeat C-Section, on Lovenox  PROCEDURE:  Procedure(s) with comments: REPEAT CESAREAN SECTION (N/A) - [name], RNFA  SURGEON:  Surgeon(s) and Role:    * Bovard-Stuckert, Ellanora Rayborn, MD - Primary  ASSISTANTS: Krietemeyer, [name] RNFA   ANESTHESIA:   spinal  EBL:  102 mL uop 300, clear; IVF 2000cc  FINDINGS: viables female at 11:15, apgars 9/9 at 1 and 5 min; wt P; nl uterus, tubes and ovaries  BLOOD ADMINISTERED:none  DRAINS: Urinary Catheter (Foley)   SPECIMEN:  Source of Specimen:  Placenta  DISPOSITION OF SPECIMEN:  L&D  COUNTS:  YES  TOURNIQUET:  * No tourniquets in log *  DICTATION: .Other Dictation: Dictation Number [id number]  PLAN OF CARE: Admit to inpatient   PATIENT DISPOSITION:  PACU - hemodynamically stable.   Delay start of Pharmacological VTE agent (>24hrs) due to surgical blood loss or risk of bleeding: not applicable

## 2018-08-03 NOTE — Transfer of Care (Signed)
Immediate Anesthesia Transfer of Care Note  Patient: Cynthia [name]  Procedure(s) Performed: REPEAT CESAREAN SECTION (N/A )  Patient Location: PACU  Anesthesia Type:Spinal  Level of Consciousness: awake  Airway & Oxygen Therapy: Patient Spontanous Breathing  Post-op Assessment: Report given to RN  Post vital signs: Reviewed and stable  Last Vitals:  Vitals Value Taken Time  BP    Temp    Pulse    Resp    SpO2      Last Pain:  Vitals:   08/03/18 0825  TempSrc: Oral         Complications: No apparent anesthesia complications

## 2018-08-04 LAB — CBC
HCT: 29.5 % — ABNORMAL LOW (ref 36.0–46.0)
Hemoglobin: 9.6 g/dL — ABNORMAL LOW (ref 12.0–15.0)
MCH: 29.3 pg (ref 26.0–34.0)
MCHC: 32.5 g/dL (ref 30.0–36.0)
MCV: 89.9 fL (ref 80.0–100.0)
PLATELETS: 209 10*3/uL (ref 150–400)
RBC: 3.28 MIL/uL — AB (ref 3.87–5.11)
RDW: 14.6 % (ref 11.5–15.5)
WBC: 10.9 10*3/uL — ABNORMAL HIGH (ref 4.0–10.5)
nRBC: 0 % (ref 0.0–0.2)

## 2018-08-04 LAB — BIRTH TISSUE RECOVERY COLLECTION (PLACENTA DONATION)

## 2018-08-04 NOTE — Addendum Note (Signed)
Addendum  created 08/04/18 1306 by Hewitt Blade, CRNA   Sign clinical note

## 2018-08-04 NOTE — Anesthesia Postprocedure Evaluation (Signed)
Anesthesia Post Note  Patient: CHARDONNAY HOLZMANN  Procedure(s) Performed: REPEAT CESAREAN SECTION (N/A )     Patient location during evaluation: Mother Baby Anesthesia Type: Spinal Level of consciousness: awake and alert Pain management: pain level controlled Vital Signs Assessment: post-procedure vital signs reviewed and stable Respiratory status: spontaneous breathing, nonlabored ventilation and respiratory function stable Cardiovascular status: stable Postop Assessment: no headache, no backache, spinal receding, patient able to bend at knees, able to ambulate, adequate PO intake and no apparent nausea or vomiting Anesthetic complications: no    Last Vitals:  Vitals:   08/04/18 0720 08/04/18 1050  BP: 116/71 115/74  Pulse: 60 62  Resp: 18   Temp: 36.7 C 36.9 C  SpO2: 99%     Last Pain:  Vitals:   08/04/18 1050  TempSrc: Oral  PainSc: 3    Pain Goal: Patients Stated Pain Goal: 3 (08/04/18 1050)               Ryonna Cimini Hristova

## 2018-08-04 NOTE — Progress Notes (Signed)
Subjective: Postpartum Day 1: Cesarean Delivery Patient reports tolerating PO and + flatus.   Has not voided yet since catherter out a few hours ago  Objective: Vital signs in last 24 hours: Temp:  [97.5 F (36.4 C)-98 F (36.7 C)] 98 F (36.7 C) (11/21 0720) Pulse Rate:  [55-76] 60 (11/21 0720) Resp:  [9-34] 18 (11/21 0720) BP: (86-123)/(46-77) 116/71 (11/21 0720) SpO2:  [96 %-100 %] 99 % (11/21 0720)  Physical Exam:  General: alert and cooperative Lochia: appropriate Uterine Fundus: firm Incision: C/D/I   Recent Labs    08/02/18 0959 08/04/18 0552  HGB 10.8* 9.6*  HCT 32.6* 29.5*    Assessment/Plan: Status post Cesarean section. Doing well postoperatively.  Continue current care.  Logan Bores 08/04/2018, 8:54 AM

## 2018-08-05 MED ORDER — OXYCODONE HCL 5 MG PO TABS: 5.0000 mg | ORAL_TABLET | ORAL | 0 refills | Status: DC | PRN

## 2018-08-05 NOTE — Discharge Instructions (Signed)
As per discharge pamphlet °

## 2018-08-05 NOTE — Discharge Summary (Signed)
OB Discharge Summary     Patient Name: Cynthia [name] DOB: [birth year] MRN: [id number]  Date of admission: 08/03/2018 Delivering MD: Janyth Contes   Date of discharge: 08/05/2018  Admitting diagnosis: repeat C-Section, on Lovenox Intrauterine pregnancy: [redacted]w[redacted]d     Secondary diagnosis:  Principal Problem:   Normal pregnancy in multigravida in third trimester Active Problems:   Status post repeat low transverse cesarean section  Additional problems: h/o DVT     Discharge diagnosis: Term Pregnancy [facility] Hospital course:  Sceduled C/S   37 y.o. yo [id number] at [redacted]w[redacted]d was admitted to the hospital 08/03/2018 for scheduled cesarean section with the following indication:Elective Repeat.  Membrane Rupture Time/Date: 11:14 AM ,08/03/2018   Patient delivered a Viable infant.08/03/2018  Details of operation can be found in separate operative note.  Pateint had an uncomplicated postpartum course.  She was restarted on her Lovenox for ho/ DVT.  She is ambulating, tolerating a regular diet, passing flatus, and urinating well. Patient is discharged home in stable condition on  08/05/18         Physical exam  Vitals:   08/04/18 0720 08/04/18 1050 08/04/18 2215 08/05/18 0612  BP: 116/71 115/74 120/76 117/67  Pulse: 60 62 60 62  Resp: 18  16 17   Temp: 98 F (36.7 C) 98.5 F (36.9 C)    TempSrc: Oral Oral    SpO2: 99%  100%   Weight:      Height:       General: alert Lochia: appropriate Uterine Fundus: firm Incision: Dressing is clean, dry, and intact  Labs: Lab Results  Component Value Date   WBC 10.9 (H) 08/04/2018   HGB 9.6 (L) 08/04/2018   HCT 29.5 (L) 08/04/2018   MCV 89.9 08/04/2018   PLT 209 08/04/2018   CMP Latest Ref Rng & Units 05/27/2015  Glucose 65 - 99 mg/dL 88  BUN 6 - 20 mg/dL 6  Creatinine 0.44 - 1.00 mg/dL 0.71  Sodium 135 - 145 mmol/L 139  Potassium 3.5 - 5.1 mmol/L 4.0  Chloride 101 - 111 mmol/L 106  CO2 22 -  32 mmol/L 28  Calcium 8.9 - 10.3 mg/dL 8.6(L)  Total Protein 6.5 - 8.1 g/dL -  Total Bilirubin 0.3 - 1.2 mg/dL -  Alkaline Phos 38 - 126 U/L -  AST 15 - 41 U/L -  ALT 14 - 54 U/L -    Discharge instruction: per After Visit Summary and "Baby and Me Booklet".  After visit meds:  Allergies as of 08/05/2018   No Known Allergies     Medication List    STOP taking these medications   rivaroxaban 20 MG Tabs tablet Commonly known as:  XARELTO   XARELTO 20 MG Tabs tablet Generic drug:  rivaroxaban     TAKE these medications   enoxaparin 40 MG/0.4ML injection Commonly known as:  LOVENOX Inject 40 mg into the skin daily.   ferrous sulfate 325 (65 FE) MG tablet Take 1 tablet (325 mg total) by mouth 2 (two) times daily with a meal.   oxyCODONE 5 MG immediate release tablet Commonly known as:  Oxy IR/ROXICODONE Take 1 tablet (5 mg total) by mouth every 4 (four) hours as needed for severe pain.   prenatal multivitamin Tabs tablet Take 1 tablet by mouth  daily at 12 noon.       Diet: routine diet  Activity: Advance as tolerated. Pelvic rest for 6 weeks.   Outpatient follow up:2 weeks  Newborn Data: Live born female  Birth Weight: 6 lb 6.3 oz (2900 g) APGAR: 44, 9  Newborn Delivery   Birth date/time:  08/03/2018 11:15:00 Delivery type:  C-Section, Low Transverse Trial of labor:  No C-section categorization:  Repeat     Baby Feeding: Breast Disposition:home with mother   08/05/2018 Clarene Duke, MD

## 2018-08-05 NOTE — Progress Notes (Signed)
POD #2 LTCS No problems, wants to go home Afeb, VSS Abd- soft, fundus firm, incision intact D/c home, continue on Lovenox

## 2018-08-05 NOTE — Op Note (Signed)
NAME: NAW, LASALA MEDICAL RECORD DJ:[id number] ACCOUNT [id number] DATE OF BIRTH:31-Oct-1980 FACILITY: [facility] LOCATION: [id number] PHYSICIAN:Anthonee Gelin BOVARD-STUCKERT, MD  OPERATIVE REPORT  DATE OF PROCEDURE:  08/03/2018  PREOPERATIVE DIAGNOSES:  Intrauterine pregnancy, history of low transverse cesarean section x3, on Lovenox for history of deep venous thrombosis.  POSTOPERATIVE DIAGNOSES:  Intrauterine pregnancy, history of low transverse cesarean section x3, on Lovenox for history of deep venous thrombosis, delivered.  PROCEDURE:  Repeat low transverse cesarean section.  SURGEON:  Janyth Contes, MD  ASSISTANT:  Claretta Fraise, RNFA   ANESTHESIA:  Spinal.  ESTIMATED BLOOD LOSS:  100 mL.  URINE OUTPUT:  300 mL clear urine at the end of the procedure.  IV FLUIDS:  2000 mL.  FINDINGS:  Viable female infant at 11:15 a.m. with Apgars of 9 at one minute and 9 at five minutes, weight pending at the time of dictation.  The uterus, tubes, and ovaries were noted to be normal.  COMPLICATIONS:  None.  PATHOLOGY:  Placenta to labor and delivery.  DESCRIPTION OF PROCEDURE:  After informed consent was reviewed with the patient including risks, benefits and alternatives of the surgical procedure, she was transported to the OR where spinal anesthesia was placed and found to be adequate.  After it was  placed, she was returned to supine position Level of anesthesia was found to be adequate and waiting 3 minutes after prep.  A timeout was performed, and a Pfannenstiel skin incision was made at the level of her previous incision and carried through to the underlying  layer of fascia sharply.  The fascia was incised in the midline.  The incision was extended laterally with Mayo scissors.  The superior aspect of the fascial incision was grasped and elevated with Kocher clamps, and the rectus muscles were dissected off  both bluntly and sharply.  The midline was easily identified, and the  peritoneum was entered bluntly.  The incision was extended superiorly and inferiorly with good visualization of the bladder.  The Alexis skin retractor was placed carefully, making  sure that no bowel was entrapped.  The uterus was explored.  The uterus was incised in a transverse fashion in the lower uterine segment.  The infant was delivered after rupturing the bag of clear fluid from the vertex presentation.  Nose and mouth were  suctioned on the field.  After a minute, the cord was clamped and cut.  The infant was then handed off to the waiting pediatric staff.  The placenta was expressed from the uterus.  The uterus was cleared of all clot and debris.  The uterine incision was  closed in 2 layers of 0 Monocryl, the first of which is a running locked and the second as an imbricating layer.  The uterus was noted to be hemostatic.  The gutters were cleared of all clots and debris.  The peritoneum was reapproximated with 2-0 Vicryl  in a running fashion.  Subfascial planes were inspected and found to be hemostatic.  The fascia was then reapproximated with 0 Vicryl from either corner, overlapping in the midline.  Subcuticular adipose layer was made hemostatic with Bovie cautery, and  the dead space was closed with plain gut.  The skin was closed with 4-0 Vicryl on a Keith needle in a subcuticular fashion.  Benzoin and Steri-Strips were applied.  The patient tolerated the procedure well.  Sponge, lap and needle counts were correct  x2.  At the end of the surgical procedure, the patient was also wanded to rule out  retained sponges.  LN/NUANCE  D:08/04/2018 T:08/05/2018 JOB:[id number]

## 2019-01-04 ENCOUNTER — Emergency Department (HOSPITAL_COMMUNITY)
Admission: EM | Admit: 2019-01-04 | Discharge: 2019-01-04 | Disposition: A | Discharge status: Home / Self Care | Payer: Medicaid Other | Attending: Emergency Medicine | Admitting: Emergency Medicine

## 2019-01-04 ENCOUNTER — Encounter (HOSPITAL_COMMUNITY): Payer: Self-pay

## 2019-01-04 ENCOUNTER — Other Ambulatory Visit: Payer: Self-pay

## 2019-01-04 DIAGNOSIS — S199XXA Unspecified injury of neck, initial encounter: Secondary | ICD-10-CM | POA: Diagnosis present

## 2019-01-04 DIAGNOSIS — T148XXA Other injury of unspecified body region, initial encounter: Secondary | ICD-10-CM

## 2019-01-04 DIAGNOSIS — F17210 Nicotine dependence, cigarettes, uncomplicated: Secondary | ICD-10-CM | POA: Diagnosis not present

## 2019-01-04 DIAGNOSIS — Y9389 Activity, other specified: Secondary | ICD-10-CM | POA: Insufficient documentation

## 2019-01-04 DIAGNOSIS — S161XXA Strain of muscle, fascia and tendon at neck level, initial encounter: Secondary | ICD-10-CM | POA: Diagnosis not present

## 2019-01-04 DIAGNOSIS — X58XXXA Exposure to other specified factors, initial encounter: Secondary | ICD-10-CM | POA: Insufficient documentation

## 2019-01-04 DIAGNOSIS — Y998 Other external cause status: Secondary | ICD-10-CM | POA: Diagnosis not present

## 2019-01-04 DIAGNOSIS — Z79899 Other long term (current) drug therapy: Secondary | ICD-10-CM | POA: Insufficient documentation

## 2019-01-04 DIAGNOSIS — S29012A Strain of muscle and tendon of back wall of thorax, initial encounter: Secondary | ICD-10-CM | POA: Insufficient documentation

## 2019-01-04 DIAGNOSIS — Y92007 Garden or yard of unspecified non-institutional (private) residence as the place of occurrence of the external cause: Secondary | ICD-10-CM | POA: Insufficient documentation

## 2019-01-04 NOTE — ED Triage Notes (Signed)
Pt states neck pain that radiates down both sides of her back x 2 days. Pt states no relief with advil at home.

## 2019-01-04 NOTE — Discharge Instructions (Addendum)
Take ibuprofen 3 times a day with meals. Take 3-4 tablets at a time (600-800 mg) Do not take other anti-inflammatories at the same time (Advil, Motrin, naproxen, Aleve). You may supplement with Tylenol if you need further pain control. Use ice packs or heating pads if this helps control your pain. Use muscle creams (salonpas, icy hot, bengay) as needed for pain control.  Your symptoms will likely last for several more days to weeks, however should be improving slightly in the next week. Return to the emergency room if you develop shortness of breath, persistent pain, numbness, you are dropping things with your hands, with any new, worsening, or concerning symptoms.

## 2019-01-04 NOTE — ED Provider Notes (Signed)
[facility] DEPT Provider Note   CSN: [id number] Arrival date & time: 01/04/19  1211    History   Chief Complaint Chief Complaint  Patient presents with  . Neck Pain    HPI Cynthia [name] is a 38 y.o. female presenting for evaluation of neck/back pain.   Pt states for the past 3 days, she has been having neck/back pain. This began after she tilled her garden 3 days ago. Pain is present with movement, described as a sharp soreness. No pain at rest. She denies fall, trauma, or injury, she has been taking 400 mg ibuprofen bid without improvement of sxs. She has not tried anything else. sxs are present bilaterally, but worse on the R side. She denies neck stiffness, fevers, chills, CP, sob, cough, n/v, abd pain, urinary sxs, or abnormal BMs. She denies leg apin or swelling. She reports a h/o PE while on OCPs, is not on OCPs now. She denies recent travel, immobilization, surgeries, trauma, h/o cancer, or current ocp use., pt states this feels different than when she had the pe. She has no current medical problems, takes no medications daily.      HPI  Past Medical History:  Diagnosis Date  . History of pulmonary embolism   . Normal pregnancy in multigravida in third trimester 07/18/2018  . Tobacco abuse     Patient Active Problem List   Diagnosis Date Noted  . Status post repeat low transverse cesarean section 08/03/2018  . Normal pregnancy in multigravida in third trimester 07/18/2018  . Sinus bradycardia 05/26/2015  . PE (pulmonary embolism) 05/25/2015  . Cigarette smoker 05/25/2015  . Pulmonary emboli ([place])   . H/O cesarean section 02/27/2015    Past Surgical History:  Procedure Laterality Date  . CESAREAN SECTION    . CESAREAN SECTION N/A 02/27/2015   Procedure: CESAREAN SECTION;  Surgeon: Newton Pigg, MD;  Location: [facility];  Service: Obstetrics;  Laterality: N/A;  . CESAREAN SECTION N/A 08/03/2018   Procedure: REPEAT CESAREAN SECTION;   Surgeon: Janyth Contes, MD;  Location: [facility];  Service: Obstetrics;  Laterality: N/A;  [name], RNFA     OB History    Gravida  8   Para  8   Term  8   Preterm      AB      Living  8     SAB      TAB      Ectopic      Multiple  0   Live Births  8            Home Medications    Prior to Admission medications   Medication Sig Start Date End Date Taking? Authorizing Provider  enoxaparin (LOVENOX) 40 MG/0.4ML injection Inject 40 mg into the skin daily.    [provider]  ferrous sulfate 325 (65 FE) MG tablet Take 1 tablet (325 mg total) by mouth 2 (two) times daily with a meal. 03/01/15   Newton Pigg, MD  oxyCODONE (OXY IR/ROXICODONE) 5 MG immediate release tablet Take 1 tablet (5 mg total) by mouth every 4 (four) hours as needed for severe pain. 08/05/18   Meisinger, Sherren Mocha, MD  Prenatal Vit-Fe Fumarate-FA (PRENATAL MULTIVITAMIN) TABS tablet Take 1 tablet by mouth daily at 12 noon.    [provider]    Family History Family History  Problem Relation Age of Onset  . Hypertension Mother   . Hypertension Father     Social History Social History  Tobacco Use  . Smoking status: Current Every Day Smoker    Packs/day: 0.50    Years: 13.00    Pack years: 6.50    Types: Cigarettes  . Smokeless tobacco: Never Used  Substance Use Topics  . Alcohol use: No    Alcohol/week: 0.0 standard drinks  . Drug use: No     Allergies   Patient has no known allergies.   Review of Systems Review of Systems  Musculoskeletal: Positive for myalgias.  All other systems reviewed and are negative.    Physical Exam Updated Vital Signs BP 118/70 (BP Location: Right Arm)   Pulse 64   Temp 98.2 F (36.8 C) (Oral)   Resp 18   Ht 5\' 5"  (1.651 m)   Wt 72.6 kg   LMP 11/05/2018   SpO2 100%   BMI 26.63 kg/m   Physical Exam Vitals signs and nursing note reviewed.  Constitutional:      General: She is not in acute distress.     Appearance: She is well-developed.     Comments: Sitting comfortably in the bed in NAD  HENT:     Head: Normocephalic and atraumatic.  Eyes:     Extraocular Movements: Extraocular movements intact.     Conjunctiva/sclera: Conjunctivae normal.     Pupils: Pupils are equal, round, and reactive to light.  Neck:     Musculoskeletal: Normal range of motion and neck supple.     Comments: Moving head easily without signs of stiffness or meningismus. No ttp of midline c-spine. Mild ttp of R sided neck musculature. Cardiovascular:     Rate and Rhythm: Normal rate and regular rhythm.  Pulmonary:     Effort: Pulmonary effort is normal. No respiratory distress.     Breath sounds: Normal breath sounds. No wheezing.     Comments: Clear lung sounds in all fields. Appears nontoxic Abdominal:     General: There is no distension.     Palpations: Abdomen is soft. There is no mass.     Tenderness: There is no abdominal tenderness. There is no guarding or rebound.  Musculoskeletal: Normal range of motion.        General: Tenderness present.     Comments: ttp of bilateral trapezius muscles, worse on the R side. No ttp of midline spine. No ttp of low back. Full active rom of upper extremities with pain. strength of upper extremities intact. Radial pulses intact. Sensation intact. No obvious swelling, trauma, or lesions.  No leg pain or swelling.  Skin:    General: Skin is warm and dry.     Capillary Refill: Capillary refill takes less than 2 seconds.  Neurological:     Mental Status: She is alert and oriented to person, place, and time.     Sensory: No sensory deficit.     Motor: No weakness.      ED Treatments / Results  Labs (all labs ordered are listed, but only abnormal results are displayed) Labs Reviewed - No data to display  EKG None  Radiology No results found.  Procedures Procedures (including critical care time)  Medications Ordered in ED Medications - No data to  display   Initial Impression / Assessment and Plan / ED Course  I have reviewed the triage vital signs and the nursing notes.  Pertinent labs & imaging results that were available during my care of the patient were reviewed by me and considered in my medical decision making (see chart for details).  Pt presenting for evaluation og bilaterally upper back/neck pain after physical labor. physical exam reassuring, doubt infectious causes such as meningitis. Doubt pulmonary cause cause as PE or pna, as pain is only with movement, no sob, cp, or cough. No neuro deficits. Discussed findings with pt. Discussed likely msk pain, and symptomatic tx. Discussed typical drawn-out course of msk pain, and concerning signs that should prompt return to the ED. At this time, pt appears safe for d/c. Pt states she understands and agrees to plan.   Final Clinical Impressions(s) / ED Diagnoses   Final diagnoses:  Muscle strain    ED Discharge Orders    None       Franchot Heidelberg, PA-C 01/04/19 1525    Sherwood Gambler, MD 01/04/19 [phone]

## 2020-02-11 ENCOUNTER — Encounter (HOSPITAL_COMMUNITY): Payer: Self-pay | Admitting: Emergency Medicine

## 2020-02-11 ENCOUNTER — Other Ambulatory Visit: Payer: Self-pay

## 2020-02-11 ENCOUNTER — Emergency Department (HOSPITAL_COMMUNITY)
Admission: EM | Admit: 2020-02-11 | Discharge: 2020-02-11 | Disposition: A | Discharge status: Home / Self Care | Payer: Medicaid Other | Attending: Emergency Medicine | Admitting: Emergency Medicine

## 2020-02-11 DIAGNOSIS — F17210 Nicotine dependence, cigarettes, uncomplicated: Secondary | ICD-10-CM | POA: Insufficient documentation

## 2020-02-11 DIAGNOSIS — Z7901 Long term (current) use of anticoagulants: Secondary | ICD-10-CM | POA: Insufficient documentation

## 2020-02-11 DIAGNOSIS — G8929 Other chronic pain: Secondary | ICD-10-CM | POA: Insufficient documentation

## 2020-02-11 DIAGNOSIS — R6884 Jaw pain: Secondary | ICD-10-CM | POA: Diagnosis not present

## 2020-02-11 DIAGNOSIS — M542 Cervicalgia: Secondary | ICD-10-CM | POA: Diagnosis present

## 2020-02-11 MED ORDER — NAPROXEN SODIUM 220 MG PO TABS
440.0000 mg | ORAL_TABLET | Freq: Two times a day (BID) | ORAL | 0 refills | Status: DC
Start: 2020-02-11 — End: 2020-09-21

## 2020-02-11 MED ORDER — METHOCARBAMOL 500 MG PO TABS
1000.0000 mg | ORAL_TABLET | Freq: Three times a day (TID) | ORAL | 0 refills | Status: DC
Start: 2020-02-11 — End: 2020-09-21

## 2020-02-11 NOTE — ED Notes (Signed)
Pt declined dc vitals.

## 2020-02-11 NOTE — ED Triage Notes (Signed)
Patient has hx of blood clots. Patient complaining of left neck pain, left behind the ear pain, and left jaw pain.

## 2020-02-11 NOTE — ED Provider Notes (Signed)
[facility] DEPT Provider Note   CSN: [id number] Arrival date & time: 02/11/20  0408     History Chief Complaint  Patient presents with  . Jaw Pain  . Otalgia  . Neck Pain    Cynthia [name] is a 39 y.o. female.  Patient to ED with chronic neck, left sided neck pain and jaw pain x 'months', worse in the last 24 hours. She take Aleve without relief. She denies new injury, fall or trauma. No sever, swelling, redness. No numbness or weakness. No headache pain. No nausea, vomiting.   The history is provided by the patient. No language interpreter was used.  Otalgia Associated symptoms: neck pain   Associated symptoms: no fever   Neck Pain Associated symptoms: no fever        Past Medical History:  Diagnosis Date  . History of pulmonary embolism   . Normal pregnancy in multigravida in third trimester 07/18/2018  . Tobacco abuse     Patient Active Problem List   Diagnosis Date Noted  . Status post repeat low transverse cesarean section 08/03/2018  . Normal pregnancy in multigravida in third trimester 07/18/2018  . Sinus bradycardia 05/26/2015  . PE (pulmonary embolism) 05/25/2015  . Cigarette smoker 05/25/2015  . Pulmonary emboli ([place])   . H/O cesarean section 02/27/2015    Past Surgical History:  Procedure Laterality Date  . CESAREAN SECTION    . CESAREAN SECTION N/A 02/27/2015   Procedure: CESAREAN SECTION;  Surgeon: Newton Pigg, MD;  Location: [facility];  Service: Obstetrics;  Laterality: N/A;  . CESAREAN SECTION N/A 08/03/2018   Procedure: REPEAT CESAREAN SECTION;  Surgeon: Janyth Contes, MD;  Location: [facility];  Service: Obstetrics;  Laterality: N/A;  [name], RNFA     OB History    Gravida  8   Para  8   Term  8   Preterm      AB      Living  8     SAB      TAB      Ectopic      Multiple  0   Live Births  8           Family History  Problem Relation Age of Onset  . Hypertension  Mother   . Hypertension Father     Social History   Tobacco Use  . Smoking status: Current Every Day Smoker    Packs/day: 0.50    Years: 13.00    Pack years: 6.50    Types: Cigarettes  . Smokeless tobacco: Never Used  Substance Use Topics  . Alcohol use: No    Alcohol/week: 0.0 standard drinks  . Drug use: No    Home Medications Prior to Admission medications   Medication Sig Start Date End Date Taking? Authorizing Provider  enoxaparin (LOVENOX) 40 MG/0.4ML injection Inject 40 mg into the skin daily.    [provider]    Allergies    Patient has no known allergies.  Review of Systems   Review of Systems  Constitutional: Negative for chills and fever.  HENT: Positive for ear pain.        Left jaw pain  Respiratory: Negative.   Cardiovascular: Negative.   Gastrointestinal: Negative.   Musculoskeletal: Positive for neck pain.       See HPI.  Skin: Negative.   Neurological: Negative.     Physical Exam Updated Vital Signs BP (!) 129/103 (BP Location: Left Arm)   Pulse  81   Temp 98.4 F (36.9 C) (Oral)   Resp 16   Ht 5\' 5"  (1.651 m)   Wt 72.6 kg   LMP 01/13/2020   SpO2 99%   BMI 26.63 kg/m   Physical Exam Vitals and nursing note reviewed.  Constitutional:      Appearance: She is well-developed.  HENT:     Head: Normocephalic and atraumatic.     Left Ear: Tympanic membrane normal.     Nose: Nose normal.     Mouth/Throat:     Mouth: Mucous membranes are moist.     Pharynx: Oropharynx is clear.     Comments: No visualized dental caries, abscesses and no dental or gingival tenderness. Oropharynx benign. Mild tenderness of the left TMJ without malocclusion.  Eyes:     Conjunctiva/sclera: Conjunctivae normal.  Neck:     Comments: There is tenderness of the left SCM muscle without overt spasm. No significant swelling. No redness.  Cardiovascular:     Rate and Rhythm: Normal rate and regular rhythm.  Pulmonary:     Effort: Pulmonary effort is  normal.     Breath sounds: Normal breath sounds.  Abdominal:     General: Bowel sounds are normal.     Palpations: Abdomen is soft.     Tenderness: There is no abdominal tenderness. There is no guarding or rebound.  Musculoskeletal:        General: Normal range of motion.     Cervical back: Normal range of motion and neck supple.     Comments: There is left paracervical tenderness that is mild without swelling. FROM UE's with equal and symmetric strength.   Skin:    General: Skin is warm and dry.     Findings: No rash.  Neurological:     Mental Status: She is alert and oriented to person, place, and time.     Sensory: No sensory deficit.     Motor: No weakness.     ED Results / Procedures / Treatments   Labs (all labs ordered are listed, but only abnormal results are displayed) Labs Reviewed - No data to display  EKG None  Radiology No results found.  Procedures Procedures (including critical care time)  Medications Ordered in ED Medications - No data to display  ED Course  I have reviewed the triage vital signs and the nursing notes.  Pertinent labs & imaging results that were available during my care of the patient were reviewed by me and considered in my medical decision making (see chart for details).    MDM Rules/Calculators/A&P                      Patient to ED with acute on chronic symptoms of neck, left jaw, ear pain as detailed in the HPI.   DDx: TMJ +/- torticollis +/- cervical radiculopathy (C3-4). Will add muscle relaxer, recommend appropriate use of Aleve and stressed PCP follow up for further outpatient management.   Final Clinical Impression(s) / ED Diagnoses Final diagnoses:  None   Acute on chronic Neck pain  Rx / DC Orders ED Discharge Orders    None       Charlann Lange, PA-C 02/11/20 0606    Rolland Porter, MD 02/11/20 [phone]

## 2020-02-11 NOTE — Discharge Instructions (Addendum)
As we talked about, your pain can be coming from several different places and you will need outpatient follow up to determine what the cause is and, therefore, how to resolve it.   Take the medications as prescribed. Warm compresses and complete rest of the neck will help with pain management. Call [facility] or a primary care provider of your choice for an appointment for further evaluation and treatment.

## 2020-09-20 ENCOUNTER — Other Ambulatory Visit: Payer: Self-pay

## 2020-09-20 ENCOUNTER — Encounter (HOSPITAL_COMMUNITY): Payer: Self-pay

## 2020-09-20 DIAGNOSIS — M546 Pain in thoracic spine: Secondary | ICD-10-CM | POA: Diagnosis not present

## 2020-09-20 DIAGNOSIS — M25512 Pain in left shoulder: Secondary | ICD-10-CM | POA: Diagnosis not present

## 2020-09-20 DIAGNOSIS — F17210 Nicotine dependence, cigarettes, uncomplicated: Secondary | ICD-10-CM | POA: Insufficient documentation

## 2020-09-20 DIAGNOSIS — M542 Cervicalgia: Secondary | ICD-10-CM | POA: Insufficient documentation
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
## 2020-09-21 ENCOUNTER — Emergency Department (HOSPITAL_COMMUNITY): Payer: Medicaid Other

## 2020-09-21 ENCOUNTER — Emergency Department (HOSPITAL_COMMUNITY)
Admission: EM | Admit: 2020-09-21 | Discharge: 2020-09-21 | Disposition: A | Discharge status: Home / Self Care | Payer: Medicaid Other | Attending: Emergency Medicine | Admitting: Emergency Medicine

## 2020-09-21 MED ORDER — NAPROXEN 500 MG PO TABS
500.0000 mg | ORAL_TABLET | Freq: Two times a day (BID) | ORAL | 0 refills | Status: DC | PRN
Start: 2020-09-21 — End: 2020-12-05

## 2020-09-21 MED ORDER — METHOCARBAMOL 500 MG PO TABS
500.0000 mg | ORAL_TABLET | Freq: Three times a day (TID) | ORAL | 0 refills | Status: DC | PRN
Start: 2020-09-21 — End: 2020-12-05
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
## 2020-11-25 ENCOUNTER — Inpatient Hospital Stay (HOSPITAL_COMMUNITY)
Admission: EM | Admit: 2020-11-25 | Discharge: 2020-11-28 | DRG: 831 | Discharge status: Against Medical Advice | Payer: Medicaid Other | Attending: Internal Medicine | Admitting: Internal Medicine

## 2020-11-25 ENCOUNTER — Emergency Department (HOSPITAL_COMMUNITY): Payer: Medicaid Other

## 2020-11-25 ENCOUNTER — Other Ambulatory Visit: Payer: Self-pay

## 2020-11-25 ENCOUNTER — Encounter (HOSPITAL_COMMUNITY): Payer: Self-pay

## 2020-11-25 ENCOUNTER — Ambulatory Visit (HOSPITAL_COMMUNITY): Payer: Medicaid Other

## 2020-11-25 DIAGNOSIS — E876 Hypokalemia: Secondary | ICD-10-CM | POA: Diagnosis present

## 2020-11-25 DIAGNOSIS — D649 Anemia, unspecified: Secondary | ICD-10-CM | POA: Diagnosis present

## 2020-11-25 DIAGNOSIS — R1011 Right upper quadrant pain: Secondary | ICD-10-CM

## 2020-11-25 DIAGNOSIS — R000 Tachycardia, unspecified: Secondary | ICD-10-CM | POA: Diagnosis present

## 2020-11-25 DIAGNOSIS — Z3A11 11 weeks gestation of pregnancy: Secondary | ICD-10-CM | POA: Diagnosis not present

## 2020-11-25 DIAGNOSIS — K858 Other acute pancreatitis without necrosis or infection: Secondary | ICD-10-CM

## 2020-11-25 DIAGNOSIS — Z20822 Contact with and (suspected) exposure to covid-19: Secondary | ICD-10-CM | POA: Diagnosis present

## 2020-11-25 DIAGNOSIS — O99284 Endocrine, nutritional and metabolic diseases complicating childbirth: Secondary | ICD-10-CM | POA: Diagnosis present

## 2020-11-25 DIAGNOSIS — K8045 Calculus of bile duct with chronic cholecystitis with obstruction: Secondary | ICD-10-CM | POA: Diagnosis present

## 2020-11-25 DIAGNOSIS — F17210 Nicotine dependence, cigarettes, uncomplicated: Secondary | ICD-10-CM | POA: Diagnosis present

## 2020-11-25 DIAGNOSIS — O99611 Diseases of the digestive system complicating pregnancy, first trimester: Principal | ICD-10-CM | POA: Diagnosis present

## 2020-11-25 DIAGNOSIS — R002 Palpitations: Secondary | ICD-10-CM | POA: Diagnosis not present

## 2020-11-25 DIAGNOSIS — K8689 Other specified diseases of pancreas: Secondary | ICD-10-CM | POA: Diagnosis present

## 2020-11-25 DIAGNOSIS — Z3491 Encounter for supervision of normal pregnancy, unspecified, first trimester: Secondary | ICD-10-CM | POA: Diagnosis not present

## 2020-11-25 DIAGNOSIS — K8050 Calculus of bile duct without cholangitis or cholecystitis without obstruction: Secondary | ICD-10-CM | POA: Diagnosis present

## 2020-11-25 DIAGNOSIS — K851 Biliary acute pancreatitis without necrosis or infection: Secondary | ICD-10-CM | POA: Diagnosis not present

## 2020-11-25 DIAGNOSIS — Z3490 Encounter for supervision of normal pregnancy, unspecified, unspecified trimester: Secondary | ICD-10-CM

## 2020-11-25 DIAGNOSIS — Z79899 Other long term (current) drug therapy: Secondary | ICD-10-CM

## 2020-11-25 DIAGNOSIS — Z86711 Personal history of pulmonary embolism: Secondary | ICD-10-CM

## 2020-11-25 DIAGNOSIS — O9902 Anemia complicating childbirth: Secondary | ICD-10-CM | POA: Diagnosis present

## 2020-11-25 DIAGNOSIS — K8020 Calculus of gallbladder without cholecystitis without obstruction: Secondary | ICD-10-CM | POA: Insufficient documentation

## 2020-11-25 DIAGNOSIS — Z7901 Long term (current) use of anticoagulants: Secondary | ICD-10-CM

## 2020-11-25 DIAGNOSIS — R935 Abnormal findings on diagnostic imaging of other abdominal regions, including retroperitoneum: Secondary | ICD-10-CM

## 2020-11-25 DIAGNOSIS — K859 Acute pancreatitis without necrosis or infection, unspecified: Secondary | ICD-10-CM

## 2020-11-25 DIAGNOSIS — O99331 Smoking (tobacco) complicating pregnancy, first trimester: Secondary | ICD-10-CM | POA: Diagnosis present

## 2020-11-25 DIAGNOSIS — E162 Hypoglycemia, unspecified: Secondary | ICD-10-CM | POA: Diagnosis present

## 2020-11-25 DIAGNOSIS — R197 Diarrhea, unspecified: Secondary | ICD-10-CM

## 2020-11-25 DIAGNOSIS — K831 Obstruction of bile duct: Secondary | ICD-10-CM

## 2020-11-25 LAB — CBC WITH DIFFERENTIAL/PLATELET
Abs Immature Granulocytes: 0.02 10*3/uL (ref 0.00–0.07)
Basophils Absolute: 0 10*3/uL (ref 0.0–0.1)
Basophils Relative: 1 %
Eosinophils Absolute: 0.1 10*3/uL (ref 0.0–0.5)
Eosinophils Relative: 2 %
HCT: 32.5 % — ABNORMAL LOW (ref 36.0–46.0)
Hemoglobin: 11.1 g/dL — ABNORMAL LOW (ref 12.0–15.0)
Immature Granulocytes: 0 %
Lymphocytes Relative: 22 %
Lymphs Abs: 1.4 10*3/uL (ref 0.7–4.0)
MCH: 28.8 pg (ref 26.0–34.0)
MCHC: 34.2 g/dL (ref 30.0–36.0)
MCV: 84.4 fL (ref 80.0–100.0)
Monocytes Absolute: 0.4 10*3/uL (ref 0.1–1.0)
Monocytes Relative: 7 %
Neutro Abs: 4.6 10*3/uL (ref 1.7–7.7)
Neutrophils Relative %: 68 %
Platelets: 277 10*3/uL (ref 150–400)
RBC: 3.85 MIL/uL — ABNORMAL LOW (ref 3.87–5.11)
RDW: 13.5 % (ref 11.5–15.5)
WBC: 6.5 10*3/uL (ref 4.0–10.5)
nRBC: 0 % (ref 0.0–0.2)

## 2020-11-25 LAB — URINALYSIS, ROUTINE W REFLEX MICROSCOPIC
Bacteria, UA: NONE SEEN
Bilirubin Urine: NEGATIVE
Glucose, UA: 50 mg/dL — AB
Ketones, ur: NEGATIVE mg/dL
Leukocytes,Ua: NEGATIVE
Nitrite: NEGATIVE
Protein, ur: 30 mg/dL — AB
Specific Gravity, Urine: 1.006 (ref 1.005–1.030)
pH: 7 (ref 5.0–8.0)

## 2020-11-25 LAB — COMPREHENSIVE METABOLIC PANEL WITH GFR
ALT: 225 U/L — ABNORMAL HIGH (ref 0–44)
AST: 181 U/L — ABNORMAL HIGH (ref 15–41)
Albumin: 3.4 g/dL — ABNORMAL LOW (ref 3.5–5.0)
Alkaline Phosphatase: 278 U/L — ABNORMAL HIGH (ref 38–126)
Anion gap: 11 (ref 5–15)
BUN: <5 mg/dL — ABNORMAL LOW (ref 6–20)
CO2: 22 mmol/L (ref 22–32)
Calcium: 8.7 mg/dL — ABNORMAL LOW (ref 8.9–10.3)
Chloride: 102 mmol/L (ref 98–111)
Creatinine, Ser: 0.56 mg/dL (ref 0.44–1.00)
GFR, Estimated: >60 mL/min
Glucose, Bld: 102 mg/dL — ABNORMAL HIGH (ref 70–99)
Potassium: 3.5 mmol/L (ref 3.5–5.1)
Sodium: 135 mmol/L (ref 135–145)
Total Bilirubin: 4.4 mg/dL — ABNORMAL HIGH (ref 0.3–1.2)
Total Protein: 6.5 g/dL (ref 6.5–8.1)

## 2020-11-25 LAB — I-STAT BETA HCG BLOOD, ED (MC, WL, AP ONLY): I-stat hCG, quantitative: >2000 m[IU]/mL — ABNORMAL HIGH (ref <5)

## 2020-11-25 LAB — RESP PANEL BY RT-PCR (FLU A&B, COVID) ARPGX2
Influenza A by PCR: NEGATIVE
Influenza B by PCR: NEGATIVE
SARS Coronavirus 2 by RT PCR: NEGATIVE

## 2020-11-25 LAB — HCG, QUANTITATIVE, PREGNANCY: hCG, Beta Chain, Quant, S: 165596 m[IU]/mL — ABNORMAL HIGH (ref <5)

## 2020-11-25 LAB — LIPASE, BLOOD: Lipase: 304 U/L — ABNORMAL HIGH (ref 11–51)

## 2020-11-25 LAB — HIV ANTIBODY (ROUTINE TESTING W REFLEX): HIV Screen 4th Generation wRfx: NONREACTIVE

## 2020-11-25 MED ORDER — MORPHINE SULFATE (PF) 2 MG/ML IV SOLN
2.0000 mg | INTRAVENOUS | Status: DC | PRN
  Administered 2020-11-25 – 2020-11-26 (×4): 2 mg via INTRAVENOUS
  Filled 2020-11-25 (×4): qty 1

## 2020-11-25 MED ORDER — SODIUM CHLORIDE 0.9 % IV BOLUS
1000.0000 mL | Freq: Once | INTRAVENOUS | Status: AC
  Administered 2020-11-25: 1000 mL via INTRAVENOUS

## 2020-11-25 MED ORDER — ACETAMINOPHEN 500 MG PO TABS: 500.0000 mg | ORAL_TABLET | Freq: Once | ORAL | Status: DC

## 2020-11-25 MED ORDER — PIPERACILLIN-TAZOBACTAM 3.375 G IVPB 30 MIN
3.3750 g | Freq: Once | INTRAVENOUS | Status: AC
  Administered 2020-11-25: 3.375 g via INTRAVENOUS
  Filled 2020-11-25: qty 50

## 2020-11-25 MED ORDER — FENTANYL CITRATE (PF) 100 MCG/2ML IJ SOLN
50.0000 ug | Freq: Once | INTRAMUSCULAR | Status: DC
  Filled 2020-11-25: qty 2

## 2020-11-25 MED ORDER — HEPARIN SODIUM (PORCINE) 5000 UNIT/ML IJ SOLN
5000.0000 [IU] | Freq: Three times a day (TID) | INTRAMUSCULAR | Status: DC
  Administered 2020-11-25 – 2020-11-27 (×6): 5000 [IU] via SUBCUTANEOUS
  Filled 2020-11-25 (×6): qty 1

## 2020-11-25 MED ORDER — LACTATED RINGERS IV SOLN: INTRAVENOUS | Status: AC

## 2020-11-25 NOTE — H&P (Signed)
History and Physical        Hospital Admission Note Date: 11/25/2020  Patient name: Cynthia [name] Medical record number: [id number] Date of birth: 1981-08-20 Age: 40 y.o. Gender: female  [facility]P: [facility]   Chief Complaint    Chief Complaint  Patient presents with  . Abdominal Pain  . Diarrhea      HPI:   This is a 40 year old female with past medical history of PE, tobacco abuse, [redacted] weeks pregnant with her ninth child who presented to the ED with RUQ abdominal pain since 3/9. Symptoms worsen with p.o. intake. Associated with watery diarrhea with up to 10 loose stools daily.  She denies any fever, chills, vomiting.  Denies any alcohol use.  No other complaints   ED Course: Afebrile, hemodynamically stable, on room air. Notable Labs: Sodium 135, K3.5, BUN 5, creatinine 0.56, alk phos 278, lipase 304, AST 181, ALT 225, T bili 4.4, WBC 6.5, Hb 11.1, UA unremarkable, COVID-19 and flu negative. Notable Imaging: RUQ US-distended GB with sludge possibly with floating punctate stones, no gallbladder wall thickening but dilated CBD and suspicion of early intrahepatic biliary ductal dilatation suggestive of choledocholithiasis. Patient received Zosyn, 1 L NS bolus.  ED provider discussed with OB who did not have any further recommendations and will see at [facility].  GI consulted who recommended GI pathogen and C. difficile panel, likely ERCP.    Vitals:   11/25/20 1030 11/25/20 1100  BP: 111/74 120/73  Pulse: 62 63  Resp: (!) 24 17  Temp:    SpO2: 100% 100%     Review of Systems:  Review of Systems  All other systems reviewed and are negative.   Medical/Social/Family History   Past Medical History: Past Medical History:  Diagnosis Date  . History of pulmonary embolism   . Normal pregnancy in multigravida in third trimester 07/18/2018  . Tobacco abuse     Past  Surgical History:  Procedure Laterality Date  . CESAREAN SECTION    . CESAREAN SECTION N/A 02/27/2015   Procedure: CESAREAN SECTION;  Surgeon: Newton Pigg, MD;  Location: [place] [facility];  Service: Obstetrics;  Laterality: N/A;  . CESAREAN SECTION N/A 08/03/2018   Procedure: REPEAT CESAREAN SECTION;  Surgeon: Janyth Contes, MD;  Location: [place];  Service: Obstetrics;  Laterality: N/A;  [name], RNFA    Medications: Prior to Admission medications   Medication Sig Start Date End Date Taking? Authorizing Provider  naproxen (NAPROSYN) 500 MG tablet Take 1 tablet (500 mg total) by mouth 2 (two) times daily as needed for moderate pain. 09/21/20  Yes Petrucelli, Samantha R, PA-C  methocarbamol (ROBAXIN) 500 MG tablet Take 1 tablet (500 mg total) by mouth every 8 (eight) hours as needed for muscle spasms. Patient not taking: No sig reported 09/21/20   Petrucelli, Samantha R, PA-C  enoxaparin (LOVENOX) 40 MG/0.4ML injection Inject 40 mg into the skin daily.  09/21/20  [provider]    Allergies:  No Known Allergies  Social History:  reports that she has been smoking cigarettes. She has a 6.50 pack-year smoking history. She has never used smokeless tobacco. She reports that she does not drink alcohol and does not use drugs.  Family History: Family History  Problem Relation Age of Onset  . Hypertension Mother   . Hypertension Father      Objective   Physical Exam: Blood pressure 120/73, pulse 63, temperature 98.3 F (36.8 C), temperature source Oral, resp. rate 17, SpO2 100 %, unknown if currently breastfeeding.  Physical Exam Vitals and nursing note reviewed.  Constitutional:      Appearance: Normal appearance.  HENT:     Head: Normocephalic and atraumatic.  Eyes:     Conjunctiva/sclera: Conjunctivae normal.  Cardiovascular:     Rate and Rhythm: Normal rate and regular rhythm.  Pulmonary:     Effort: Pulmonary effort is normal.     Breath sounds: Normal  breath sounds.  Abdominal:     General: Abdomen is flat.     Palpations: Abdomen is soft.     Tenderness: There is abdominal tenderness in the right upper quadrant.  Musculoskeletal:        General: No swelling or tenderness.  Skin:    Coloration: Skin is not jaundiced or pale.  Neurological:     Mental Status: She is alert. Mental status is at baseline.  Psychiatric:        Mood and Affect: Mood normal.        Behavior: Behavior normal.     Comments: Answers with 1 word responses and makes poor eye contact     LABS on Admission: I have personally reviewed all the labs and imaging below    Basic Metabolic Panel: Recent Labs  Lab 11/25/20 0723  NA 135  K 3.5  CL 102  CO2 22  GLUCOSE 102*  BUN <5*  CREATININE 0.56  CALCIUM 8.7*   Liver Function Tests: Recent Labs  Lab 11/25/20 0723  AST 181*  ALT 225*  ALKPHOS 278*  BILITOT 4.4*  PROT 6.5  ALBUMIN 3.4*   Recent Labs  Lab 11/25/20 0723  LIPASE 304*   No results for input(s): AMMONIA in the last 168 hours. CBC: Recent Labs  Lab 11/25/20 0723  WBC 6.5  NEUTROABS 4.6  HGB 11.1*  HCT 32.5*  MCV 84.4  PLT 277   Cardiac Enzymes: No results for input(s): CKTOTAL, CKMB, CKMBINDEX, TROPONINI in the last 168 hours. BNP: Invalid input(s): POCBNP CBG: No results for input(s): GLUCAP in the last 168 hours.  Radiological Exams on Admission:  US OB Comp < 14 Wks  Result Date: 11/25/2020 CLINICAL DATA:  Pregnancy. EXAM: OBSTETRIC <14 WK ULTRASOUND TECHNIQUE: Transabdominal ultrasound was performed for evaluation of the gestation as well as the maternal uterus and adnexal regions. COMPARISON:  None. FINDINGS: Intrauterine gestational sac: Single Yolk sac:  Visualized. Embryo:  Visualized. Cardiac Activity: Visualized. Heart Rate: 158 bpm CRL:   43.3 mm   11 w 1 d                  [place] EDC: June 15, 2021. Subchorionic hemorrhage:  None visualized. Maternal uterus/adnexae: Ovaries are unremarkable. No free fluid is  noted. IMPRESSION: Single live intrauterine gestation of 11 weeks 1 day. Electronically Signed   By: Marijo Conception M.D.   On: 11/25/2020 09:54   US Abdomen Limited RUQ (LIVER/GB)  Result Date: 11/25/2020 CLINICAL DATA:  40 year old female with right upper quadrant pain for several weeks. EXAM: ULTRASOUND ABDOMEN LIMITED RIGHT UPPER QUADRANT COMPARISON:  CTA chest 05/25/2015. FINDINGS: Gallbladder: Gallbladder lumen appears filled with echogenic sludge, possibly with punctate floating stones (image 10). Wall thickness remains normal. No pericholecystic fluid. Equivocal sonographic  Murphy sign. Common bile duct: Diameter: Up to 12 mm, dilated. No filling defect identified in the visible duct (image 21). Liver: I do suspect a degree of intrahepatic biliary ductal dilatation. No discrete liver lesion identified. Liver echogenicity at the upper limits of normal. Portal vein is patent on color Doppler imaging with normal direction of blood flow towards the liver. Other: Negative visible right kidney.  No free fluid. IMPRESSION: Gallbladder distended with sludge, possibly with floating punctate stones. No gallbladder wall thickening but dilated common bile duct and suspicion of early intrahepatic biliary ductal dilatation. This constellation suggests Choledocholithiasis. Recommend further evaluation. Electronically Signed   By: Genevie Ann M.D.   On: 11/25/2020 07:54      EKG: Not done   A & P   Principal Problem:   Pancreatitis Active Problems:   Pregnancy   Choledocholithiasis   Diarrhea   1. Pancreatitis likely from choledocholithiasis  elevated LFTs a. Lipase 304, alk phos 278, AST 181, ALT 225 b. GI consulted-patient may benefit from an ERCP c. will keep n.p.o. d. Continue IV fluids  2. Diarrhea a. GI recommending GI pathogen and C. difficile panel  3. 11-week pregnancy a. ED provider discussed with OB who did not have any further recommendations but will round later today  4. History  of PE    DVT prophylaxis:  heparin   Code Status: Full Code  Diet: N.p.o. Family Communication: Admission, patients condition and plan of care including tests being ordered have been discussed with the patient who indicates understanding and agrees with the plan and Code Status.  Disposition Plan: The appropriate patient status for this patient is OBSERVATION. Observation status is judged to be reasonable and necessary in order to provide the required intensity of service to ensure the patient's safety. The patient's presenting symptoms, physical exam findings, and initial radiographic and laboratory data in the context of their medical condition is felt to place them at decreased risk for further clinical deterioration. Furthermore, it is anticipated that the patient will be medically stable for discharge from the hospital within 2 midnights of admission. The following factors support the patient status of observation.   " The patient's presenting symptoms include abdominal pain and diarrhea. " The physical exam findings include abdominal pain. " The initial radiographic and laboratory data are pregnancy, suspect choledocholithiasis.   Consultants  . GI . OB  Procedures  . None  Time Spent on Admission: 60 minutes    Harold Hedge, DO [facility]  11/25/2020, 11:53 AM

## 2020-11-25 NOTE — Consult Note (Signed)
OB/GYN Faculty Note  Patient not seen, history taken from EMR and attending. OB consulted for pregnancy recommendations.  Patient [redacted] weeks pregnant by [place] today, admitted for observation for abdominal pain, suspected pancreatitis secondary to likely choledocholithiasis, GI consult pending.   Pregnancy is pre-viable, no fetal intervention indicated and would not do daily heart tones at this point. Recommend full intervention for primary issue as per GI. If she needs cholecystectomy, recommend planning for 2nd trimester (14-28 weeks) but may be done at any point if necessary. Recommend nifedipine, hydralazine, labetalol for BP management prn. Agree with zosyn, safe in pregnancy.   Please call with any questions or concerns, will plan to have patient follow up in office as outpatient.   Feliz Beam, MD, [place] for [facility])

## 2020-11-25 NOTE — ED Triage Notes (Signed)
Pt came in from home with complaints of diarrhea and abdominal pain to RUQ. She states that she thinks that it may be her gallbladder. This started Wednesday and has been ongoing. Burning aching pain.

## 2020-11-25 NOTE — ED Provider Notes (Signed)
[facility] DEPT Provider Note   CSN: [id number] Arrival date & time: 11/25/20  0630     History Chief Complaint  Patient presents with  . Abdominal Pain  . Diarrhea    KAIDYNCE PFISTER is a 40 y.o. female G 10 P8 history of PE and smoker otherwise healthy  Patient presents today for right upper quadrant abdominal pain onset 6 days ago worsened when eating food or drinking soda sharp/aching sensation moderate intensity nonradiating no alleviating factors.  Pain has been intermittent.  Denies similar in the past.  Patient reports associated with small amount of diarrhea over the past few days as well as nausea  Patient also reports that she missed her period in February last menstrual period sometime in the end of January 2022.  Denies fever/chills, chest pain/shortness of breath, cough, vomiting, extremity swelling/color change, vaginal bleeding/discharge or any additional concerns  HPI     Past Medical History:  Diagnosis Date  . History of pulmonary embolism   . Normal pregnancy in multigravida in third trimester 07/18/2018  . Tobacco abuse     Patient Active Problem List   Diagnosis Date Noted  . Status post repeat low transverse cesarean section 08/03/2018  . Normal pregnancy in multigravida in third trimester 07/18/2018  . Sinus bradycardia 05/26/2015  . PE (pulmonary embolism) 05/25/2015  . Cigarette smoker 05/25/2015  . Pulmonary emboli ([place])   . H/O cesarean section 02/27/2015    Past Surgical History:  Procedure Laterality Date  . CESAREAN SECTION    . CESAREAN SECTION N/A 02/27/2015   Procedure: CESAREAN SECTION;  Surgeon: Newton Pigg, MD;  Location: [place] [facility];  Service: Obstetrics;  Laterality: N/A;  . CESAREAN SECTION N/A 08/03/2018   Procedure: REPEAT CESAREAN SECTION;  Surgeon: Janyth Contes, MD;  Location: [place];  Service: Obstetrics;  Laterality: N/A;  [name], RNFA     OB History    Gravida  8    Para  8   Term  8   Preterm      AB      Living  8     SAB      IAB      Ectopic      Multiple  0   Live Births  8           Family History  Problem Relation Age of Onset  . Hypertension Mother   . Hypertension Father     Social History   Tobacco Use  . Smoking status: Current Every Day Smoker    Packs/day: 0.50    Years: 13.00    Pack years: 6.50    Types: Cigarettes  . Smokeless tobacco: Never Used  Vaping Use  . Vaping Use: Never used  Substance Use Topics  . Alcohol use: No    Alcohol/week: 0.0 standard drinks  . Drug use: No    Home Medications Prior to Admission medications   Medication Sig Start Date End Date Taking? Authorizing Provider  naproxen (NAPROSYN) 500 MG tablet Take 1 tablet (500 mg total) by mouth 2 (two) times daily as needed for moderate pain. 09/21/20  Yes Petrucelli, Samantha R, PA-C  methocarbamol (ROBAXIN) 500 MG tablet Take 1 tablet (500 mg total) by mouth every 8 (eight) hours as needed for muscle spasms. Patient not taking: No sig reported 09/21/20   Petrucelli, Samantha R, PA-C  enoxaparin (LOVENOX) 40 MG/0.4ML injection Inject 40 mg into the skin daily.  09/21/20  [provider]  Allergies    Patient has no known allergies.  Review of Systems   Review of Systems Ten systems are reviewed and are negative for acute change except as noted in the HPI  Physical Exam Updated Vital Signs BP 111/74   Pulse 62   Temp 98.3 F (36.8 C) (Oral)   Resp (!) 24   SpO2 100%   Physical Exam Constitutional:      General: She is not in acute distress.    Appearance: Normal appearance. She is well-developed. She is not ill-appearing or diaphoretic.  HENT:     Head: Normocephalic and atraumatic.  Eyes:     General: Vision grossly intact. Gaze aligned appropriately.     Pupils: Pupils are equal, round, and reactive to light.  Neck:     Trachea: Trachea and phonation normal.  Pulmonary:     Effort: Pulmonary effort is  normal. No respiratory distress.  Abdominal:     General: There is no distension.     Palpations: Abdomen is soft.     Tenderness: There is abdominal tenderness in the right upper quadrant. There is no guarding or rebound.  Musculoskeletal:        General: Normal range of motion.     Cervical back: Normal range of motion.  Skin:    General: Skin is warm and dry.  Neurological:     Mental Status: She is alert.     GCS: GCS eye subscore is 4. GCS verbal subscore is 5. GCS motor subscore is 6.     Comments: Speech is clear and goal oriented, follows commands Major Cranial nerves without deficit, no facial droop Moves extremities without ataxia, coordination intact  Psychiatric:        Behavior: Behavior normal.     ED Results / Procedures / Treatments   Labs (all labs ordered are listed, but only abnormal results are displayed) Labs Reviewed  CBC WITH DIFFERENTIAL/PLATELET - Abnormal; Notable for the following components:      Result Value   RBC 3.85 (*)    Hemoglobin 11.1 (*)    HCT 32.5 (*)    All other components within normal limits  COMPREHENSIVE METABOLIC PANEL - Abnormal; Notable for the following components:   Glucose, Bld 102 (*)    BUN <5 (*)    Calcium 8.7 (*)    Albumin 3.4 (*)    AST 181 (*)    ALT 225 (*)    Alkaline Phosphatase 278 (*)    Total Bilirubin 4.4 (*)    All other components within normal limits  LIPASE, BLOOD - Abnormal; Notable for the following components:   Lipase 304 (*)    All other components within normal limits  URINALYSIS, ROUTINE W REFLEX MICROSCOPIC - Abnormal; Notable for the following components:   Color, Urine AMBER (*)    Glucose, UA 50 (*)    Hgb urine dipstick SMALL (*)    Protein, ur 30 (*)    All other components within normal limits  HCG, QUANTITATIVE, PREGNANCY - Abnormal; Notable for the following components:   hCG, Beta Chain, Quant, S 165,596 (*)    All other components within normal limits  I-STAT BETA HCG BLOOD, ED  (MC, WL, AP ONLY) - Abnormal; Notable for the following components:   I-stat hCG, quantitative >2,000.0 (*)    All other components within normal limits  RESP PANEL BY RT-PCR (FLU A&B, COVID) [id number]    EKG None  Radiology US OB Comp < 14 Wks  Result Date: 11/25/2020 CLINICAL DATA:  Pregnancy. EXAM: OBSTETRIC <14 WK ULTRASOUND TECHNIQUE: Transabdominal ultrasound was performed for evaluation of the gestation as well as the maternal uterus and adnexal regions. COMPARISON:  None. FINDINGS: Intrauterine gestational sac: Single Yolk sac:  Visualized. Embryo:  Visualized. Cardiac Activity: Visualized. Heart Rate: 158 bpm CRL:   43.3 mm   11 w 1 d                  [place] EDC: June 15, 2021. Subchorionic hemorrhage:  None visualized. Maternal uterus/adnexae: Ovaries are unremarkable. No free fluid is noted. IMPRESSION: Single live intrauterine gestation of 11 weeks 1 day. Electronically Signed   By: Marijo Conception M.D.   On: 11/25/2020 09:54   US Abdomen Limited RUQ (LIVER/GB)  Result Date: 11/25/2020 CLINICAL DATA:  40 year old female with right upper quadrant pain for several weeks. EXAM: ULTRASOUND ABDOMEN LIMITED RIGHT UPPER QUADRANT COMPARISON:  CTA chest 05/25/2015. FINDINGS: Gallbladder: Gallbladder lumen appears filled with echogenic sludge, possibly with punctate floating stones (image 10). Wall thickness remains normal. No pericholecystic fluid. Equivocal sonographic Murphy sign. Common bile duct: Diameter: Up to 12 mm, dilated. No filling defect identified in the visible duct (image 21). Liver: I do suspect a degree of intrahepatic biliary ductal dilatation. No discrete liver lesion identified. Liver echogenicity at the upper limits of normal. Portal vein is patent on color Doppler imaging with normal direction of blood flow towards the liver. Other: Negative visible right kidney.  No free fluid. IMPRESSION: Gallbladder distended with sludge, possibly with floating punctate stones. No gallbladder  wall thickening but dilated common bile duct and suspicion of early intrahepatic biliary ductal dilatation. This constellation suggests Choledocholithiasis. Recommend further evaluation. Electronically Signed   By: Genevie Ann M.D.   On: 11/25/2020 07:54    Procedures Procedures   Medications Ordered in ED Medications  sodium chloride 0.9 % bolus 1,000 mL (1,000 mLs Intravenous Bolus 11/25/20 0736)  piperacillin-tazobactam (ZOSYN) IVPB 3.375 g (0 g Intravenous Stopped 11/25/20 4098)    ED Course  I have reviewed the triage vital signs and the nursing notes.  Pertinent labs & imaging results that were available during my care of the patient were reviewed by me and considered in my medical decision making (see chart for details).  Clinical Course as of 11/25/20 1138  Mon Nov 25, 2020  1191 Anderson Malta [facility] [BM]  1101 Dr. Rosana Hoes [BM]    Clinical Course User Index [BM] Gari Crown   MDM Rules/Calculators/A&P                         Additional history obtained from: 1. Nursing notes from this visit. 2. Electronic medical records ----------- 40 year old female presented with 6 days of right upper quadrant abdominal pain nausea and small amount of diarrhea.  She denies any infectious symptoms or chest pain/shortness of breath.  Vital signs are stable.  Will obtain abdominal pain labs as well as right upper quadrant ultrasound. - Patient's pregnancy test returned positive, this was discussed with patient she reports that she has 8 children at home ranging from age 39 to age 2.  She reports that she may be about [redacted] weeks pregnant but does not have established OB care she is requesting pelvic ultrasound today.  Denies any vaginal bleeding/discharge or pregnancy related concerns.  Will obtain ultrasound OB complete less than 14 weeks to evaluate for IUP. - RUQ [place]:  IMPRESSION:  Gallbladder distended with sludge,  possibly with floating punctate  stones. No gallbladder wall thickening but  dilated common bile duct  and suspicion of early intrahepatic biliary ductal dilatation.  This constellation suggests Choledocholithiasis. Recommend further  evaluation.   CMP shows total bilirubin 4.4, alk phos 278, ALT 225, AST 181.  No emergent electrolyte derangement, AKI or gap.  Lipase was also elevated at 304.  CBC shows mild anemia 11.1, no leukocytosis.  Concern for choledocholithiasis given ultrasound findings and lab findings.  Consult was called to gastroenterology spoke with Anderson Malta with [facility] at 8:42 AM who advised they are coming to evaluate the patient.  I consulted with pharmacist Nyra Market regarding Zosyn, safe and in first trimester.  Patient was then updated on findings and concerns she states understanding and is agreeable for GI evaluation. - US OB <14 weeks:  IMPRESSION:  Single live intrauterine gestation of 11 weeks 1 day.   Patient updated on ultrasound findings she states understanding she has no further concerns regarding her pregnancy at this time. - Covid/influenza panel negative.  Spoke with Anderson Malta from [facility] who is seen and evaluated the patient.  They asked for patient to be admitted to medicine service. - 10:45 AM: Consult with Dr. Neysa Bonito, patient accepted to medicine service. I have placed consult to OB/GYN. - 11:01 AM: Consult with OB/GYN Dr. Rosana Hoes, advises patient can stay here at [facility]  Dr. Rosana Hoes will come by later today to evaluate the patient.  No further recommendations at this time. - GI and hospitalist updated on OB/GYN's recommendations.  Patient was admitted to medicine service    Note: Portions of this report may have been transcribed using voice recognition software. Every effort was made to ensure accuracy; however, inadvertent computerized transcription errors may still be present. Final Clinical Impression(s) / ED Diagnoses Final diagnoses:  RUQ abdominal pain  Choledocholithiasis  First trimester pregnancy    Rx /  DC Orders ED Discharge Orders    None       Gari Crown 11/25/20 1140    Milton Ferguson, MD 11/26/20 [phone]

## 2020-11-25 NOTE — ED Notes (Signed)
Lab called to collect hCG pregnancy.

## 2020-11-25 NOTE — Consult Note (Signed)
Consultation  Referring Provider: Dr. Roderic Palau    Primary Care Physician:  [facility] Primary Gastroenterologist: Althia Forts       Reason for Consultation:   Question choledocholithiasis, elevated LFTs right upper quadrant pain         HPI:   Cynthia [name] is a 40 y.o. female with a past medical history as listed below, currently [redacted] weeks pregnant with her ninth child, who presented to the ER yesterday with a complaint of worsening right upper quadrant pain and diarrhea.    Today, the patient explains that she started with some right upper quadrant pain on Wednesday, 11/20/2020 and this pain then worsened over the weekend.  Also described worsening diarrhea, at times she would have a watery stool anytime that her abdomen hurt which was "all the time".  She describes more than 10 loose stools a day.  Tells me since being in the ER her pain is some better.  She has not had anything to eat since last night or drink.  Does describe previous episodes of right upper quadrant pain throughout her life which seem to come and go, "nothing like this".  Describes pain was a 10/10 but since arriving is now 5-6/10.    Denies fever, chills or weight loss.  Past Medical History:  Diagnosis Date  . History of pulmonary embolism   . Normal pregnancy in multigravida in third trimester 07/18/2018  . Tobacco abuse     Past Surgical History:  Procedure Laterality Date  . CESAREAN SECTION    . CESAREAN SECTION N/A 02/27/2015   Procedure: CESAREAN SECTION;  Surgeon: Newton Pigg, MD;  Location: [facility];  Service: Obstetrics;  Laterality: N/A;  . CESAREAN SECTION N/A 08/03/2018   Procedure: REPEAT CESAREAN SECTION;  Surgeon: Janyth Contes, MD;  Location: [facility];  Service: Obstetrics;  Laterality: N/A;  [name], RNFA    Family History  Problem Relation Age of Onset  . Hypertension Mother   . Hypertension Father      Social History   Tobacco Use  . Smoking status:  Current Every Day Smoker    Packs/day: 0.50    Years: 13.00    Pack years: 6.50    Types: Cigarettes  . Smokeless tobacco: Never Used  Vaping Use  . Vaping Use: Never used  Substance Use Topics  . Alcohol use: No    Alcohol/week: 0.0 standard drinks  . Drug use: No    Prior to Admission medications   Medication Sig Start Date End Date Taking? Authorizing Provider  naproxen (NAPROSYN) 500 MG tablet Take 1 tablet (500 mg total) by mouth 2 (two) times daily as needed for moderate pain. 09/21/20  Yes Petrucelli, Samantha R, PA-C  methocarbamol (ROBAXIN) 500 MG tablet Take 1 tablet (500 mg total) by mouth every 8 (eight) hours as needed for muscle spasms. Patient not taking: No sig reported 09/21/20   Petrucelli, Samantha R, PA-C  enoxaparin (LOVENOX) 40 MG/0.4ML injection Inject 40 mg into the skin daily.  09/21/20  [provider]    No current facility-administered medications for this encounter.   Current Outpatient Medications  Medication Sig Dispense Refill  . naproxen (NAPROSYN) 500 MG tablet Take 1 tablet (500 mg total) by mouth 2 (two) times daily as needed for moderate pain. 15 tablet 0  . methocarbamol (ROBAXIN) 500 MG tablet Take 1 tablet (500 mg total) by mouth every 8 (eight) hours as needed for muscle spasms. (Patient not taking: No  sig reported) 15 tablet 0    Allergies as of 11/25/2020  . (No Known Allergies)     Review of Systems:    Constitutional: No weight loss, fever or chills Skin: No rash  Cardiovascular: No chest pain Respiratory: No SOB  Gastrointestinal: See HPI and otherwise negative Genitourinary: No dysuria  Neurological: No headache, dizziness or syncope Musculoskeletal: No new muscle or joint pain Hematologic: No bleeding  Psychiatric: No history of depression or anxiety    Physical Exam:  Vital signs in last 24 hours: Temp:  [98.3 F (36.8 C)] 98.3 F (36.8 C) (03/14 0719) Pulse Rate:  [57-76] 63 (03/14 1100) Resp:  [12-24] 17 (03/14  1100) BP: (108-124)/(72-85) 120/73 (03/14 1100) SpO2:  [100 %] 100 % (03/14 1100)   General:   Pleasant AA female appears to be in NAD, Well developed, Well nourished, alert and cooperative Head:  Normocephalic and atraumatic. Eyes:   PEERL, EOMI. No icterus. Conjunctiva pink. Ears:  Normal auditory acuity. Neck:  Supple Throat: Oral cavity and pharynx without inflammation, swelling or lesion.  Lungs: Respirations even and unlabored. Lungs clear to auscultation bilaterally.   No wheezes, crackles, or rhonchi.  Heart: Normal S1, S2. No MRG. Regular rate and rhythm. No peripheral edema, cyanosis or pallor.  Abdomen:  Soft, nondistended, marked right upper quadrant TTP with some involuntary guarding. Normal bowel sounds. No appreciable masses or hepatomegaly. Rectal:  Not performed.  Msk:  Symmetrical without gross deformities. Peripheral pulses intact.  Extremities:  Without edema, no deformity or joint abnormality.  Neurologic:  Alert and  oriented x4;  grossly normal neurologically.  Skin:   Dry and intact without significant lesions or rashes. Psychiatric: Demonstrates good judgement and reason without abnormal affect or behaviors.   LAB RESULTS: Recent Labs    11/25/20 0723  WBC 6.5  HGB 11.1*  HCT 32.5*  PLT 277   BMET Recent Labs    11/25/20 0723  NA 135  K 3.5  CL 102  CO2 22  GLUCOSE 102*  BUN <5*  CREATININE 0.56  CALCIUM 8.7*   LFT Recent Labs    11/25/20 0723  PROT 6.5  ALBUMIN 3.4*  AST 181*  ALT 225*  ALKPHOS 278*  BILITOT 4.4*   STUDIES: US OB Comp < 14 Wks  Result Date: 11/25/2020 CLINICAL DATA:  Pregnancy. EXAM: OBSTETRIC <14 WK ULTRASOUND TECHNIQUE: Transabdominal ultrasound was performed for evaluation of the gestation as well as the maternal uterus and adnexal regions. COMPARISON:  None. FINDINGS: Intrauterine gestational sac: Single Yolk sac:  Visualized. Embryo:  Visualized. Cardiac Activity: Visualized. Heart Rate: 158 bpm CRL:   43.3 mm    11 w 1 d                  [place] EDC: June 15, 2021. Subchorionic hemorrhage:  None visualized. Maternal uterus/adnexae: Ovaries are unremarkable. No free fluid is noted. IMPRESSION: Single live intrauterine gestation of 11 weeks 1 day. Electronically Signed   By: Marijo Conception M.D.   On: 11/25/2020 09:54   US Abdomen Limited RUQ (LIVER/GB)  Result Date: 11/25/2020 CLINICAL DATA:  40 year old female with right upper quadrant pain for several weeks. EXAM: ULTRASOUND ABDOMEN LIMITED RIGHT UPPER QUADRANT COMPARISON:  CTA chest 05/25/2015. FINDINGS: Gallbladder: Gallbladder lumen appears filled with echogenic sludge, possibly with punctate floating stones (image 10). Wall thickness remains normal. No pericholecystic fluid. Equivocal sonographic Murphy sign. Common bile duct: Diameter: Up to 12 mm, dilated. No filling defect identified in the visible  duct (image 21). Liver: I do suspect a degree of intrahepatic biliary ductal dilatation. No discrete liver lesion identified. Liver echogenicity at the upper limits of normal. Portal vein is patent on color Doppler imaging with normal direction of blood flow towards the liver. Other: Negative visible right kidney.  No free fluid. IMPRESSION: Gallbladder distended with sludge, possibly with floating punctate stones. No gallbladder wall thickening but dilated common bile duct and suspicion of early intrahepatic biliary ductal dilatation. This constellation suggests Choledocholithiasis. Recommend further evaluation. Electronically Signed   By: Genevie Ann M.D.   On: 11/25/2020 07:54    Impression / Plan:   Impression: 1.  Elevated LFTs: with imaging showing CBD dilation up to 12 mm; suspected choledocholithiasis 2.  Right upper quadrant pain: With above 3.  Diarrhea: Consider relation to above versus infectious etiology  Plan: 1.  Recommend GI pathogen panel and C. difficile 2.  We will discuss above with Dr. Tarri Glenn.  Patient would likely benefit from an ERCP. 3.   Discussed with the ER physician who will have patient admitted to the hospitalist team.  They did discuss with OB and they explained the patient did not need to be moved to [facility] given early pregnancy, they will come by to see later today here at [facility]. 4.  Please await any further recommendations from Dr. Tarri Glenn later today.  Thank you for your kind consultation, we will continue to follow.  Cynthia [name]  11/25/2020, 11:05 AM

## 2020-11-25 NOTE — Progress Notes (Signed)
Pharmacy: heparin for VTE prophylaxis  Patient's a 40 y.o F who is 11-wk pregnant presented to the ED on 3/14 with c/o diarrhea and abdominal pain.  US abdomen showed findings consistent with choledocholithiasis. Pharmacy has been consulted to start heparin for VTE prophylaxis.  Plan: - heparin 5000 mg SQ q8h - pharmacy will sign off. Re-consult [place] if need further assistance  Dia Sitter, PharmD, BCPS 11/25/2020 1:42 PM

## 2020-11-26 ENCOUNTER — Encounter (HOSPITAL_COMMUNITY): Payer: Self-pay | Admitting: Radiology

## 2020-11-26 ENCOUNTER — Observation Stay (HOSPITAL_COMMUNITY): Payer: Medicaid Other

## 2020-11-26 DIAGNOSIS — E876 Hypokalemia: Secondary | ICD-10-CM | POA: Diagnosis present

## 2020-11-26 DIAGNOSIS — Z7901 Long term (current) use of anticoagulants: Secondary | ICD-10-CM | POA: Diagnosis not present

## 2020-11-26 DIAGNOSIS — O99611 Diseases of the digestive system complicating pregnancy, first trimester: Secondary | ICD-10-CM | POA: Diagnosis present

## 2020-11-26 DIAGNOSIS — Z3A11 11 weeks gestation of pregnancy: Secondary | ICD-10-CM | POA: Diagnosis not present

## 2020-11-26 DIAGNOSIS — O99331 Smoking (tobacco) complicating pregnancy, first trimester: Secondary | ICD-10-CM | POA: Diagnosis present

## 2020-11-26 DIAGNOSIS — Z79899 Other long term (current) drug therapy: Secondary | ICD-10-CM | POA: Diagnosis not present

## 2020-11-26 DIAGNOSIS — E162 Hypoglycemia, unspecified: Secondary | ICD-10-CM | POA: Diagnosis present

## 2020-11-26 DIAGNOSIS — R000 Tachycardia, unspecified: Secondary | ICD-10-CM | POA: Diagnosis present

## 2020-11-26 DIAGNOSIS — K8689 Other specified diseases of pancreas: Secondary | ICD-10-CM | POA: Diagnosis present

## 2020-11-26 DIAGNOSIS — Z20822 Contact with and (suspected) exposure to covid-19: Secondary | ICD-10-CM | POA: Diagnosis present

## 2020-11-26 DIAGNOSIS — K8045 Calculus of bile duct with chronic cholecystitis with obstruction: Secondary | ICD-10-CM | POA: Diagnosis present

## 2020-11-26 DIAGNOSIS — K858 Other acute pancreatitis without necrosis or infection: Secondary | ICD-10-CM

## 2020-11-26 DIAGNOSIS — K851 Biliary acute pancreatitis without necrosis or infection: Secondary | ICD-10-CM | POA: Diagnosis present

## 2020-11-26 DIAGNOSIS — R002 Palpitations: Secondary | ICD-10-CM | POA: Diagnosis not present

## 2020-11-26 DIAGNOSIS — K831 Obstruction of bile duct: Secondary | ICD-10-CM | POA: Diagnosis not present

## 2020-11-26 DIAGNOSIS — R935 Abnormal findings on diagnostic imaging of other abdominal regions, including retroperitoneum: Secondary | ICD-10-CM | POA: Diagnosis not present

## 2020-11-26 DIAGNOSIS — Z86711 Personal history of pulmonary embolism: Secondary | ICD-10-CM | POA: Diagnosis not present

## 2020-11-26 DIAGNOSIS — D649 Anemia, unspecified: Secondary | ICD-10-CM | POA: Diagnosis present

## 2020-11-26 DIAGNOSIS — O9902 Anemia complicating childbirth: Secondary | ICD-10-CM | POA: Diagnosis present

## 2020-11-26 DIAGNOSIS — K859 Acute pancreatitis without necrosis or infection, unspecified: Secondary | ICD-10-CM

## 2020-11-26 DIAGNOSIS — R1011 Right upper quadrant pain: Secondary | ICD-10-CM | POA: Diagnosis present

## 2020-11-26 DIAGNOSIS — O99284 Endocrine, nutritional and metabolic diseases complicating childbirth: Secondary | ICD-10-CM | POA: Diagnosis present

## 2020-11-26 DIAGNOSIS — F17210 Nicotine dependence, cigarettes, uncomplicated: Secondary | ICD-10-CM | POA: Diagnosis present

## 2020-11-26 DIAGNOSIS — Z3491 Encounter for supervision of normal pregnancy, unspecified, first trimester: Secondary | ICD-10-CM | POA: Diagnosis not present

## 2020-11-26 LAB — COMPREHENSIVE METABOLIC PANEL
ALT: 180 U/L — ABNORMAL HIGH (ref 0–44)
AST: 126 U/L — ABNORMAL HIGH (ref 15–41)
Albumin: 3.2 g/dL — ABNORMAL LOW (ref 3.5–5.0)
Alkaline Phosphatase: 297 U/L — ABNORMAL HIGH (ref 38–126)
Anion gap: 11 (ref 5–15)
BUN: 7 mg/dL (ref 6–20)
CO2: 19 mmol/L — ABNORMAL LOW (ref 22–32)
Calcium: 8.7 mg/dL — ABNORMAL LOW (ref 8.9–10.3)
Chloride: 106 mmol/L (ref 98–111)
Creatinine, Ser: 0.38 mg/dL — ABNORMAL LOW (ref 0.44–1.00)
GFR, Estimated: >60 mL/min (ref >60)
Glucose, Bld: 64 mg/dL — ABNORMAL LOW (ref 70–99)
Potassium: 3.3 mmol/L — ABNORMAL LOW (ref 3.5–5.1)
Sodium: 136 mmol/L (ref 135–145)
Total Bilirubin: 5.4 mg/dL — ABNORMAL HIGH (ref 0.3–1.2)
Total Protein: 6.1 g/dL — ABNORMAL LOW (ref 6.5–8.1)

## 2020-11-26 LAB — CBC
HCT: 32.8 % — ABNORMAL LOW (ref 36.0–46.0)
Hemoglobin: 11.1 g/dL — ABNORMAL LOW (ref 12.0–15.0)
MCH: 29.1 pg (ref 26.0–34.0)
MCHC: 33.8 g/dL (ref 30.0–36.0)
MCV: 85.9 fL (ref 80.0–100.0)
Platelets: 265 10*3/uL (ref 150–400)
RBC: 3.82 MIL/uL — ABNORMAL LOW (ref 3.87–5.11)
RDW: 13.5 % (ref 11.5–15.5)
WBC: 6.3 10*3/uL (ref 4.0–10.5)
nRBC: 0 % (ref 0.0–0.2)

## 2020-11-26 LAB — GLUCOSE, CAPILLARY
Glucose-Capillary: 63 mg/dL — ABNORMAL LOW (ref 70–99)
Glucose-Capillary: 74 mg/dL (ref 70–99)
Glucose-Capillary: 62 mg/dL — ABNORMAL LOW (ref 70–99)
Glucose-Capillary: 84 mg/dL (ref 70–99)
Glucose-Capillary: 72 mg/dL (ref 70–99)

## 2020-11-26 LAB — MAGNESIUM: Magnesium: 1.6 mg/dL — ABNORMAL LOW (ref 1.7–2.4)

## 2020-11-26 LAB — LIPASE, BLOOD: Lipase: 80 U/L — ABNORMAL HIGH (ref 11–51)

## 2020-11-26 MED ORDER — MAGNESIUM SULFATE 2 GM/50ML IV SOLN
2.0000 g | Freq: Once | INTRAVENOUS | Status: AC
  Administered 2020-11-26: 2 g via INTRAVENOUS
  Filled 2020-11-26: qty 50

## 2020-11-26 MED ORDER — POTASSIUM CHLORIDE 10 MEQ/100ML IV SOLN
10.0000 meq | Freq: Once | INTRAVENOUS | Status: AC
  Administered 2020-11-26: 10 meq via INTRAVENOUS
  Filled 2020-11-26: qty 100

## 2020-11-26 MED ORDER — POTASSIUM CL IN DEXTROSE 5% 20 MEQ/L IV SOLN: 20.0000 meq | INTRAVENOUS | Status: DC

## 2020-11-26 MED ORDER — DEXTROSE 50 % IV SOLN
25.0000 mL | Freq: Once | INTRAVENOUS | Status: AC
  Administered 2020-11-26: 25 mL via INTRAVENOUS
  Filled 2020-11-26: qty 50

## 2020-11-26 MED ORDER — KCL IN DEXTROSE-NACL 20-5-0.45 MEQ/L-%-% IV SOLN
INTRAVENOUS | Status: DC
  Filled 2020-11-26: qty 1000

## 2020-11-26 MED ORDER — POTASSIUM CL IN DEXTROSE 5% 20 MEQ/L IV SOLN
20.0000 meq | INTRAVENOUS | Status: DC
  Administered 2020-11-27: 20 meq via INTRAVENOUS
  Filled 2020-11-26 (×3): qty 1000

## 2020-11-26 MED ORDER — POTASSIUM CHLORIDE 10 MEQ/100ML IV SOLN
INTRAVENOUS | Status: AC
  Administered 2020-11-26: 10 meq
  Filled 2020-11-26: qty 100

## 2020-11-26 MED ORDER — POTASSIUM CHLORIDE 2 MEQ/ML IV SOLN
INTRAVENOUS | Status: DC
  Filled 2020-11-26: qty 1000

## 2020-11-26 MED ORDER — POTASSIUM CHLORIDE 10 MEQ/100ML IV SOLN
10.0000 meq | INTRAVENOUS | Status: AC
Start: 2020-11-26 — End: 2020-11-26
  Administered 2020-11-26 (×2): 10 meq via INTRAVENOUS
  Filled 2020-11-26 (×2): qty 100

## 2020-11-26 MED ORDER — DEXTROSE 50 % IV SOLN
12.5000 g | INTRAVENOUS | Status: AC
  Administered 2020-11-26: 12.5 g via INTRAVENOUS
  Filled 2020-11-26: qty 50

## 2020-11-26 NOTE — Progress Notes (Signed)
PROGRESS NOTE   Cynthia [name]  ZYS:[id number]    DOB: 11/28/1980    DOA: 11/25/2020  PCP: [facility]   I have briefly reviewed patients previous medical records in [facility].  Chief Complaint  Patient presents with  . Abdominal Pain  . Diarrhea    Brief Narrative:  40 year old female with medical history significant for but not limited to pulmonary embolism, tobacco abuse, [redacted] weeks pregnant with her ninth child who presented to the ED with complaints of RUQ/epigastric abdominal pain since 3/9, worse with oral intake, associated multiple watery diarrhea.  Admitted for suspected gallstone pancreatitis.  [facility] consulted and evaluating, getting MRI/MRCP.  Treating supportively.   Assessment & Plan:  Principal Problem:   Pancreatitis Active Problems:   Pregnancy   Choledocholithiasis   Diarrhea   First trimester pregnancy   Acute pancreatitis   Acute pancreatitis/pancreatic head mass by MRCP  Presented with history as noted above.  Labs on admission: Lipase 304, alkaline phosphatase 278, AST 181, ALT 225 and total bilirubin 4.4.  RUQ ultrasound: Gallbladder distended with sludge, possibly with floating punctate stones.  No gallbladder wall thickening.  Dilated CBD and suspicion of early intrahepatic biliary ductal dilatation.  This constellation suggests choledocholithiasis.  Follow-up labs: Lipase down to 80, alkaline phosphatase 297, AST 126, ALT 180 and total bilirubin has increased to 5.4.  Treating supportively with NPO, IV fluids and pain management.  [facility] consultation and follow-up appreciated.  Given ongoing abdominal pain, persistent LFT abnormality, worsening total bilirubin, ultrasound concerning for CBD stones, getting MRCP.  Addendum: MRCP: Double duct sign with masslike area in the head of the pancreas, suspicious for pancreatic neoplasm.  Rest details as below.  Await GI follow up.   Diarrhea, self-limited  May be related  to acute pancreatitis versus acute viral  Reports no BM since 3/13  Agree with GI regarding discontinuing order for GI and C. difficile panel and contact isolation.  Low index of suspicion for C. Difficile.  Hypoglycemia  Blood glucose 64 on CMP and coincides with fingerstick of 63.  Secondary to NPO.  Resuscitated with IV D50.  Changed IV fluids from D5 half NS to D5.  Monitor CBGs closely.  GI allowing clear liquids which should help.  Hypokalemia/hypomagnesemia  Replace IV and follow.  [redacted] weeks pregnant  Confirmed by ultrasound  OB MD input appreciated.  Pregnancy is previable, no fetal intervention indicated and recommend full intervention for primary issue as per GI.  Also recommend that if she needs cholecystectomy, recommend planning for second trimester (14-28 weeks close parent but may be done at any point if necessary.  Palpitations/transient tachyarrhythmia  Early morning of 3/15, patient developed rapid palpitations but was not on the cardiac monitor.  Heart rate noted by RN to reach 180s-200s on pulse oximetry before quickly coming down.  No recurrence and asymptomatic.  Unclear etiology, could be SVT versus others.  Continue telemetry monitoring.  Replace electrolytes as above  Check TSH.  History of PE  Normocytic anemia  Stable.  Body mass index is 27.33 kg/m.   DVT prophylaxis: heparin injection 5,000 Units Start: 11/25/20 1400     Code Status: Full Code Family Communication: None at bedside Disposition:  Status is: Observation  The patient will require care spanning > 2 midnights and should be moved to inpatient because: Inpatient level of care appropriate due to severity of illness  Dispo: The patient is from: Home  Anticipated d/c is to: Home              Patient currently is not medically stable to d/c.   Difficult to place patient No        Consultants:   [facility]  Procedures:    None  Antimicrobials:    Anti-infectives (From admission, onward)   Start     Dose/Rate Route Frequency Ordered Stop   11/25/20 0830  piperacillin-tazobactam (ZOSYN) IVPB 3.375 g        3.375 g 100 mL/hr over 30 Minutes Intravenous  Once 11/25/20 0827 11/25/20 0928        Subjective:  Patient seen early this morning.  No BM since 3/13.  Ongoing epigastric/RUQ pain.  Denies nausea or vomit  Objective:   Vitals:   11/26/20 0420 11/26/20 0433 11/26/20 0632 11/26/20 1328  BP: 107/63 114/85 (!) 105/58 107/68  Pulse: 99 (!) 153 64 62  Resp: 18 20 19 16   Temp: 98.3 F (36.8 C) 98.3 F (36.8 C) 98.4 F (36.9 C) 98.1 F (36.7 C)  TempSrc: Oral Oral Oral Oral  SpO2: 100% 100% 100% 100%  Weight:      Height:        General exam: Pleasant young female, moderately built and nourished lying comfortably propped up in bed without distress.  Oral mucosa moist. Respiratory system: Clear to auscultation. Respiratory effort normal. Cardiovascular system: S1 & S2 heard, RRR. No JVD, murmurs, rubs, gallops or clicks. No pedal edema.  Telemetry personally reviewed: Sinus rhythm without arrhythmias. Gastrointestinal system: Abdomen is nondistended, soft.  Mild tenderness of the epigastrium/RUQ/RLQ without rigidity, guarding or rebound. No organomegaly or masses felt. Normal bowel sounds heard. Central nervous system: Alert and oriented. No focal neurological deficits. Extremities: Symmetric 5 x 5 power. Skin: No rashes, lesions or ulcers Psychiatry: Judgement and insight appear normal. Mood & affect appropriate.     Data Reviewed:   I have personally reviewed following labs and imaging studies   CBC: Recent Labs  Lab 11/25/20 0723 11/26/20 0342  WBC 6.5 6.3  NEUTROABS 4.6  --   HGB 11.1* 11.1*  HCT 32.5* 32.8*  MCV 84.4 85.9  PLT 277 333    Basic Metabolic Panel: Recent Labs  Lab 11/25/20 0723 11/26/20 0342  NA 135 136  K 3.5 3.3*  CL 102 106  CO2 22 19*  GLUCOSE 102*  64*  BUN <5* 7  CREATININE 0.56 0.38*  CALCIUM 8.7* 8.7*  MG  --  1.6*    Liver Function Tests: Recent Labs  Lab 11/25/20 0723 11/26/20 0342  AST 181* 126*  ALT 225* 180*  ALKPHOS 278* 297*  BILITOT 4.4* 5.4*  PROT 6.5 6.1*  ALBUMIN 3.4* 3.2*    CBG: Recent Labs  Lab 11/26/20 0619 11/26/20 1006  GLUCAP 63* 74    Microbiology Studies:   Recent Results (from the past 240 hour(s))  Resp Panel by RT-PCR (Flu A&B, Covid) Nasopharyngeal Swab     Status: None   Collection Time: 11/25/20  8:51 AM   Specimen: Nasopharyngeal Swab; Nasopharyngeal(NP) swabs in vial transport medium  Result Value Ref Range Status   SARS Coronavirus 2 by RT PCR NEGATIVE NEGATIVE Final    Comment: (NOTE) SARS-CoV-2 target nucleic acids are NOT DETECTED.  The SARS-CoV-2 RNA is generally detectable in upper respiratory specimens during the acute phase of infection. The lowest concentration of SARS-CoV-2 viral copies this assay can detect is 138 copies/mL. A negative result does not preclude  SARS-Cov-2 infection and should not be used as the sole basis for treatment or other patient management decisions. A negative result may occur with  improper specimen collection/handling, submission of specimen other than nasopharyngeal swab, presence of viral mutation(s) within the areas targeted by this assay, and inadequate number of viral copies(<138 copies/mL). A negative result must be combined with clinical observations, patient history, and epidemiological information. The expected result is Negative.  Fact Sheet for Patients:  EntrepreneurPulse.com.au  Fact Sheet for Healthcare Providers:  IncredibleEmployment.be  This test is no t yet approved or cleared by the [place] FDA and  has been authorized for detection and/or diagnosis of SARS-CoV-2 by FDA under an Emergency Use Authorization (EUA). This EUA will remain  in effect (meaning this test can be  used) for the duration of the COVID-19 declaration under Section 564(b)(1) of the Act, 21 U.S.C.section 360bbb-3(b)(1), unless the authorization is terminated  or revoked sooner.       Influenza A by PCR NEGATIVE NEGATIVE Final   Influenza B by PCR NEGATIVE NEGATIVE Final    Comment: (NOTE) The Xpert Xpress SARS-CoV-2/FLU/RSV plus assay is intended as an aid in the diagnosis of influenza from Nasopharyngeal swab specimens and should not be used as a sole basis for treatment. Nasal washings and aspirates are unacceptable for Xpert Xpress SARS-CoV-2/FLU/RSV testing.  Fact Sheet for Patients: EntrepreneurPulse.com.au  Fact Sheet for Healthcare Providers: IncredibleEmployment.be  This test is not yet approved or cleared by the [place] FDA and has been authorized for detection and/or diagnosis of SARS-CoV-2 by FDA under an Emergency Use Authorization (EUA). This EUA will remain in effect (meaning this test can be used) for the duration of the COVID-19 declaration under Section 564(b)(1) of the Act, 21 U.S.C. section 360bbb-3(b)(1), unless the authorization is terminated or revoked.  Performed at [facility], [place] [address] [place], [place] [address]      Radiology Studies:  US OB Comp < 14 Wks  Result Date: 11/25/2020 CLINICAL DATA:  Pregnancy. EXAM: OBSTETRIC <14 WK ULTRASOUND TECHNIQUE: Transabdominal ultrasound was performed for evaluation of the gestation as well as the maternal uterus and adnexal regions. COMPARISON:  None. FINDINGS: Intrauterine gestational sac: Single Yolk sac:  Visualized. Embryo:  Visualized. Cardiac Activity: Visualized. Heart Rate: 158 bpm CRL:   43.3 mm   11 w 1 d                  [place] EDC: June 15, 2021. Subchorionic hemorrhage:  None visualized. Maternal uterus/adnexae: Ovaries are unremarkable. No free fluid is noted. IMPRESSION: Single live intrauterine gestation of 11 weeks 1 day.  Electronically Signed   By: Marijo Conception M.D.   On: 11/25/2020 09:54   MR ABDOMEN MRCP WO CONTRAST  Addendum Date: 11/26/2020   ADDENDUM REPORT: 11/26/2020 14:18 ADDENDUM: These results were called by telephone at the time of interpretation on 11/26/2020 at 2:17 pm to provider [facility] , who verbally acknowledged these results. Other findings relate by the radiology assistant as outlined in the Z vision dash board. Electronically Signed   By: Zetta Bills M.D.   On: 11/26/2020 14:18   Result Date: 11/26/2020 CLINICAL DATA:  RIGHT upper quadrant abdominal pain. Nondiagnostic ultrasound. EXAM: MRI ABDOMEN WITHOUT CONTRAST  (INCLUDING MRCP) TECHNIQUE: Multiplanar multisequence MR imaging of the abdomen was performed. Heavily T2-weighted images of the biliary and pancreatic ducts were obtained, and three-dimensional MRCP images were rendered by post processing. COMPARISON:  Ultrasound evaluation of the same date  showing dilated appearance of the biliary tree. FINDINGS: Lower chest: No consolidation. No pleural effusion noted at the lung bases. Hepatobiliary: Diffuse intra and extrahepatic biliary duct distension with abrupt termination in the pancreatic portion of the common bile duct. No pericholecystic fluid. Wall thickness approximately 3 mm. No filling defect in the common bile duct to suggest choledocholithiasis. Sludge in the gallbladder lumen. Low insertion of posterior division RIGHT hepatic ducts upon the biliary tree seen passing into the hepatic duct approximately 8 mm above the cystic duct. Pancreas: Irregular pancreatic ductal distension. Loss of normal intrinsic T1 signal focally in the pancreatic head with masslike appearance, this corresponds to intermediate T2 signal. No area of restricted diffusion in this location. This area measures 2.0 x 1.9 cm.  Mild peripancreatic stranding. Spleen:  Normal size and contour.  No focal splenic lesion. Adrenals/Urinary Tract:  Adrenal glands are  normal.  The No sign of hydronephrosis.  Smooth renal contours. Stomach/Bowel: No acute gastrointestinal process to the extent evaluated. Vascular/Lymphatic: Limited assessment on noncontrast imaging. No adenopathy. No signs of aneurysmal dilation of the abdominal aorta. Other:  No ascites. Musculoskeletal: No suspicious bone lesions identified. IMPRESSION: 1. Double duct sign with masslike area in the head of the pancreas, suspicious for pancreatic neoplasm. It is possible that if there is a history of repeated pancreatitis is could represent sequela of groove pancreatitis and stricture though the abrupt termination has a more malignant rather than benign appearance. CT with intravenous contrast of the abdomen only may be helpful for further evaluation to exclude the possibility of pancreatic mass and guide further evaluation such as endoscopic ultrasound or ERCP. 2. Pancreatic ductal distension and mild relative atrophy. 3. Mild gallbladder wall thickening with mild diffusion abnormality associated with the gallbladder wall. Findings are nonspecific and could even be related to chronic cholecystitis in the setting of biliary obstruction, equivocal for acute biliary pathology. Contrasted imaging could also be helpful to assess for signs of acute inflammation 4. Mild peripancreatic stranding may be related to an overlay of acute mild interstitial edematous pancreatitis. 5. Biliary ductal variant with posterior RIGHT hepatic ducts draining just above the cystic duct confluence with the common bile duct. 6. Sludge in the gallbladder lumen. 7. Mild peripancreatic stranding. A call is out to the referring provider to further discuss findings in the above case. Electronically Signed: By: Zetta Bills M.D. On: 11/26/2020 13:53   MR 3D Recon At Scanner  Addendum Date: 11/26/2020   ADDENDUM REPORT: 11/26/2020 14:18 ADDENDUM: These results were called by telephone at the time of interpretation on 11/26/2020 at 2:17 pm  to provider [facility] , who verbally acknowledged these results. Other findings relate by the radiology assistant as outlined in the Z vision dash board. Electronically Signed   By: Zetta Bills M.D.   On: 11/26/2020 14:18   Result Date: 11/26/2020 CLINICAL DATA:  RIGHT upper quadrant abdominal pain. Nondiagnostic ultrasound. EXAM: MRI ABDOMEN WITHOUT CONTRAST  (INCLUDING MRCP) TECHNIQUE: Multiplanar multisequence MR imaging of the abdomen was performed. Heavily T2-weighted images of the biliary and pancreatic ducts were obtained, and three-dimensional MRCP images were rendered by post processing. COMPARISON:  Ultrasound evaluation of the same date showing dilated appearance of the biliary tree. FINDINGS: Lower chest: No consolidation. No pleural effusion noted at the lung bases. Hepatobiliary: Diffuse intra and extrahepatic biliary duct distension with abrupt termination in the pancreatic portion of the common bile duct. No pericholecystic fluid. Wall thickness approximately 3 mm. No filling defect in the common bile  duct to suggest choledocholithiasis. Sludge in the gallbladder lumen. Low insertion of posterior division RIGHT hepatic ducts upon the biliary tree seen passing into the hepatic duct approximately 8 mm above the cystic duct. Pancreas: Irregular pancreatic ductal distension. Loss of normal intrinsic T1 signal focally in the pancreatic head with masslike appearance, this corresponds to intermediate T2 signal. No area of restricted diffusion in this location. This area measures 2.0 x 1.9 cm.  Mild peripancreatic stranding. Spleen:  Normal size and contour.  No focal splenic lesion. Adrenals/Urinary Tract:  Adrenal glands are normal.  The No sign of hydronephrosis.  Smooth renal contours. Stomach/Bowel: No acute gastrointestinal process to the extent evaluated. Vascular/Lymphatic: Limited assessment on noncontrast imaging. No adenopathy. No signs of aneurysmal dilation of the abdominal aorta.  Other:  No ascites. Musculoskeletal: No suspicious bone lesions identified. IMPRESSION: 1. Double duct sign with masslike area in the head of the pancreas, suspicious for pancreatic neoplasm. It is possible that if there is a history of repeated pancreatitis is could represent sequela of groove pancreatitis and stricture though the abrupt termination has a more malignant rather than benign appearance. CT with intravenous contrast of the abdomen only may be helpful for further evaluation to exclude the possibility of pancreatic mass and guide further evaluation such as endoscopic ultrasound or ERCP. 2. Pancreatic ductal distension and mild relative atrophy. 3. Mild gallbladder wall thickening with mild diffusion abnormality associated with the gallbladder wall. Findings are nonspecific and could even be related to chronic cholecystitis in the setting of biliary obstruction, equivocal for acute biliary pathology. Contrasted imaging could also be helpful to assess for signs of acute inflammation 4. Mild peripancreatic stranding may be related to an overlay of acute mild interstitial edematous pancreatitis. 5. Biliary ductal variant with posterior RIGHT hepatic ducts draining just above the cystic duct confluence with the common bile duct. 6. Sludge in the gallbladder lumen. 7. Mild peripancreatic stranding. A call is out to the referring provider to further discuss findings in the above case. Electronically Signed: By: Zetta Bills M.D. On: 11/26/2020 13:53   US Abdomen Limited RUQ (LIVER/GB)  Result Date: 11/25/2020 CLINICAL DATA:  40 year old female with right upper quadrant pain for several weeks. EXAM: ULTRASOUND ABDOMEN LIMITED RIGHT UPPER QUADRANT COMPARISON:  CTA chest 05/25/2015. FINDINGS: Gallbladder: Gallbladder lumen appears filled with echogenic sludge, possibly with punctate floating stones (image 10). Wall thickness remains normal. No pericholecystic fluid. Equivocal sonographic Murphy sign. Common  bile duct: Diameter: Up to 12 mm, dilated. No filling defect identified in the visible duct (image 21). Liver: I do suspect a degree of intrahepatic biliary ductal dilatation. No discrete liver lesion identified. Liver echogenicity at the upper limits of normal. Portal vein is patent on color Doppler imaging with normal direction of blood flow towards the liver. Other: Negative visible right kidney.  No free fluid. IMPRESSION: Gallbladder distended with sludge, possibly with floating punctate stones. No gallbladder wall thickening but dilated common bile duct and suspicion of early intrahepatic biliary ductal dilatation. This constellation suggests Choledocholithiasis. Recommend further evaluation. Electronically Signed   By: Genevie Ann M.D.   On: 11/25/2020 07:54     Scheduled Meds:   . heparin injection (subcutaneous)  5,000 Units Subcutaneous Q8H    Continuous Infusions:   . [START ON 11/27/2020] dextrose 5 % with KCl 20 mEq / L       LOS: 0 days     Vernell Leep, MD, [place], [facility]. [facility]    To contact the attending  provider between 7A-7P or the covering provider during after hours 7P-7A, please log into the web site [email or web address] and access using universal [place] password for that web site. If you do not have the password, please call the hospital operator.  11/26/2020, 2:48 PM

## 2020-11-26 NOTE — Progress Notes (Signed)
[facility]  3:06 PM 11/26/2020  Received results from the MRI with question of abnormality in the pancreas.  Per their recommendations they want [place] to order a CT of the abdomen with contrast for further evaluation to help delineate this.  I did call the patient and let her know results from the MRI and also let her know that I will be ordering a CT which will hopefully be done later today.  Ellouise Newer, PA-C

## 2020-11-26 NOTE — Progress Notes (Signed)
Cross-coverage note:   Patient developed rapid palpitations, she was not on cardiac monitoring but HR noted by RN to reach 180s-200 on pulse oximeter before quickly coming down. Patient asymptomatic now, states has never had this before. Likely SVT. Plan to check EKG, keep her on cardiac monitoring, replace electrolytes.

## 2020-11-26 NOTE — Progress Notes (Signed)
Progress Note   Subjective  Chief Complaint: Elevated LFTs, suspected choledocholithiasis  Patient continues with right upper quadrant pain today which is no better overnight, she has not had a bowel movement at all since admission and tells me this is because "I have not eaten anything".  Remains nauseous.    Objective   Vital signs in last 24 hours: Temp:  [98.2 F (36.8 C)-98.7 F (37.1 C)] 98.4 F (36.9 C) (03/15 9292) Pulse Rate:  [55-153] 64 (03/15 0632) Resp:  [17-20] 19 (03/15 4462) BP: (105-120)/(57-85) 105/58 (03/15 8638) SpO2:  [98 %-100 %] 100 % (03/15 1771) Weight:  [74.5 kg] 74.5 kg (03/15 0210) Last BM Date: 11/24/20 General:    AA female in NAD Heart:  Regular rate and rhythm; no murmurs Lungs: Respirations even and unlabored, lungs CTA bilaterally Abdomen:  Soft, marked right upper quadrant TTP and nondistended. Normal bowel sounds. Extremities:  Without edema. Neurologic:  Alert and oriented,  grossly normal neurologically. Psych:  Cooperative. Normal mood and affect.  Intake/Output from previous day: 03/14 0701 - 03/15 0700 In: 324.1 [I.V.:198.7; IV Piggyback:125.5] Out: -   Lab Results: Recent Labs    11/25/20 0723 11/26/20 0342  WBC 6.5 6.3  HGB 11.1* 11.1*  HCT 32.5* 32.8*  PLT 277 265   BMET Recent Labs    11/25/20 0723 11/26/20 0342  NA 135 136  K 3.5 3.3*  CL 102 106  CO2 22 19*  GLUCOSE 102* 64*  BUN <5* 7  CREATININE 0.56 0.38*  CALCIUM 8.7* 8.7*   Hepatic Function Latest Ref Rng & Units 11/26/2020 11/25/2020 05/25/2015  Total Protein 6.5 - 8.1 g/dL 6.1(L) 6.5 7.2  Albumin 3.5 - 5.0 g/dL 3.2(L) 3.4(L) 3.5  AST 15 - 41 U/L 126(H) 181(H) 16  ALT 0 - 44 U/L 180(H) 225(H) 9(L)  Alk Phosphatase 38 - 126 U/L 297(H) 278(H) 67  Total Bilirubin 0.3 - 1.2 mg/dL 5.4(H) 4.4(H) 0.6   Studies/Results: US OB Comp < 14 Wks  Result Date: 11/25/2020 CLINICAL DATA:  Pregnancy. EXAM: OBSTETRIC <14 WK ULTRASOUND TECHNIQUE: Transabdominal  ultrasound was performed for evaluation of the gestation as well as the maternal uterus and adnexal regions. COMPARISON:  None. FINDINGS: Intrauterine gestational sac: Single Yolk sac:  Visualized. Embryo:  Visualized. Cardiac Activity: Visualized. Heart Rate: 158 bpm CRL:   43.3 mm   11 w 1 d                  [place] EDC: June 15, 2021. Subchorionic hemorrhage:  None visualized. Maternal uterus/adnexae: Ovaries are unremarkable. No free fluid is noted. IMPRESSION: Single live intrauterine gestation of 11 weeks 1 day. Electronically Signed   By: Marijo Conception M.D.   On: 11/25/2020 09:54   US Abdomen Limited RUQ (LIVER/GB)  Result Date: 11/25/2020 CLINICAL DATA:  40 year old female with right upper quadrant pain for several weeks. EXAM: ULTRASOUND ABDOMEN LIMITED RIGHT UPPER QUADRANT COMPARISON:  CTA chest 05/25/2015. FINDINGS: Gallbladder: Gallbladder lumen appears filled with echogenic sludge, possibly with punctate floating stones (image 10). Wall thickness remains normal. No pericholecystic fluid. Equivocal sonographic Murphy sign. Common bile duct: Diameter: Up to 12 mm, dilated. No filling defect identified in the visible duct (image 21). Liver: I do suspect a degree of intrahepatic biliary ductal dilatation. No discrete liver lesion identified. Liver echogenicity at the upper limits of normal. Portal vein is patent on color Doppler imaging with normal direction of blood flow towards the liver. Other: Negative visible right kidney.  No free fluid. IMPRESSION: Gallbladder distended with sludge, possibly with floating punctate stones. No gallbladder wall thickening but dilated common bile duct and suspicion of early intrahepatic biliary ductal dilatation. This constellation suggests Choledocholithiasis. Recommend further evaluation. Electronically Signed   By: Genevie Ann M.D.   On: 11/25/2020 07:54       Assessment / Plan:   Assessment: 1.  Elevated LFTs: Worsening today with continued right upper quadrant  pain; very suspicious for choledocholithiasis 2.  Right upper quadrant ultrasound suspicious for choledocholithiasis 3.  Diarrhea: None in the past 24 hours per patient; likely not infectious, likely related to pancreatitis 4.  [redacted] weeks pregnant  Plan: 1.  Will remove GI pathogen panel and C. difficile testing as patient has not had a bowel movement the past 24 hours.  We will also remove enteric precautions. 2.  Ordered MRCP today for further evaluation. 3.  Continue supportive measures 4.  Please await further recommendations from Dr. Tarri Glenn later today.  Thank you for your kind consultation, we will continue to follow.     LOS: 0 days   Levin Erp  11/26/2020, 10:31 AM

## 2020-11-27 DIAGNOSIS — K831 Obstruction of bile duct: Secondary | ICD-10-CM

## 2020-11-27 DIAGNOSIS — R935 Abnormal findings on diagnostic imaging of other abdominal regions, including retroperitoneum: Secondary | ICD-10-CM

## 2020-11-27 LAB — CBC
HCT: 34.1 % — ABNORMAL LOW (ref 36.0–46.0)
Hemoglobin: 11.7 g/dL — ABNORMAL LOW (ref 12.0–15.0)
MCH: 29.1 pg (ref 26.0–34.0)
MCHC: 34.3 g/dL (ref 30.0–36.0)
MCV: 84.8 fL (ref 80.0–100.0)
Platelets: 295 10*3/uL (ref 150–400)
RBC: 4.02 MIL/uL (ref 3.87–5.11)
RDW: 13.6 % (ref 11.5–15.5)
WBC: 6 10*3/uL (ref 4.0–10.5)
nRBC: 0 % (ref 0.0–0.2)

## 2020-11-27 LAB — COMPREHENSIVE METABOLIC PANEL
ALT: 201 U/L — ABNORMAL HIGH (ref 0–44)
AST: 193 U/L — ABNORMAL HIGH (ref 15–41)
Albumin: 3.4 g/dL — ABNORMAL LOW (ref 3.5–5.0)
Alkaline Phosphatase: 365 U/L — ABNORMAL HIGH (ref 38–126)
Anion gap: 8 (ref 5–15)
BUN: <5 mg/dL — ABNORMAL LOW (ref 6–20)
CO2: 23 mmol/L (ref 22–32)
Calcium: 9.3 mg/dL (ref 8.9–10.3)
Chloride: 107 mmol/L (ref 98–111)
Creatinine, Ser: 0.45 mg/dL (ref 0.44–1.00)
GFR, Estimated: >60 mL/min (ref >60)
Glucose, Bld: 95 mg/dL (ref 70–99)
Potassium: 3.8 mmol/L (ref 3.5–5.1)
Sodium: 138 mmol/L (ref 135–145)
Total Bilirubin: 6 mg/dL — ABNORMAL HIGH (ref 0.3–1.2)
Total Protein: 6.8 g/dL (ref 6.5–8.1)

## 2020-11-27 LAB — GLUCOSE, CAPILLARY
Glucose-Capillary: 103 mg/dL — ABNORMAL HIGH (ref 70–99)
Glucose-Capillary: 113 mg/dL — ABNORMAL HIGH (ref 70–99)
Glucose-Capillary: 117 mg/dL — ABNORMAL HIGH (ref 70–99)
Glucose-Capillary: 129 mg/dL — ABNORMAL HIGH (ref 70–99)
Glucose-Capillary: 130 mg/dL — ABNORMAL HIGH (ref 70–99)
Glucose-Capillary: 79 mg/dL (ref 70–99)
Glucose-Capillary: 83 mg/dL (ref 70–99)

## 2020-11-27 LAB — LIPASE, BLOOD: Lipase: 122 U/L — ABNORMAL HIGH (ref 11–51)

## 2020-11-27 MED ORDER — SODIUM CHLORIDE 0.9 % IV SOLN: INTRAVENOUS | Status: DC

## 2020-11-27 MED ORDER — SODIUM CHLORIDE 0.9 % IV SOLN
3.0000 g | Freq: Once | INTRAVENOUS | Status: DC
  Filled 2020-11-27: qty 8

## 2020-11-27 NOTE — Progress Notes (Addendum)
Progress Note   Subjective  Chief Complaint: Elevated LFTs, suspected gallstone pancreatitis  This morning, patient tells me she has not required pain meds in the past 24 hours, she is feeling better other than continuing with some diarrhea.  She does ask if maybe she can go home or at least eat some more food.   Objective   Vital signs in last 24 hours: Temp:  [98.1 F (36.7 C)-98.7 F (37.1 C)] 98.7 F (37.1 C) (03/16 0400) Pulse Rate:  [61-66] 66 (03/16 0400) Resp:  [16] 16 (03/16 0400) BP: (107-119)/(68-76) 112/71 (03/16 0400) SpO2:  [100 %] 100 % (03/16 0400) Last BM Date: 11/25/19 General:    AA female in NAD Heart:  Regular rate and rhythm; no murmurs Lungs: Respirations even and unlabored, lungs CTA bilaterally Abdomen:  Soft, mild epigastric/RUQ ttp and nondistended. Normal bowel sounds. Extremities:  Without edema. Neurologic:  Alert and oriented,  grossly normal neurologically. Psych:  Cooperative. Normal mood and affect. Lab Results: Recent Labs    11/25/20 0723 11/26/20 0342 11/27/20 0354  WBC 6.5 6.3 6.0  HGB 11.1* 11.1* 11.7*  HCT 32.5* 32.8* 34.1*  PLT 277 265 295   BMET Recent Labs    11/25/20 0723 11/26/20 0342 11/27/20 0354  NA 135 136 138  K 3.5 3.3* 3.8  CL 102 106 107  CO2 22 19* 23  GLUCOSE 102* 64* 95  BUN <5* 7 <5*  CREATININE 0.56 0.38* 0.45  CALCIUM 8.7* 8.7* 9.3   Hepatic Function Latest Ref Rng & Units 11/27/2020 11/26/2020 11/25/2020  Total Protein 6.5 - 8.1 g/dL 6.8 6.1(L) 6.5  Albumin 3.5 - 5.0 g/dL 3.4(L) 3.2(L) 3.4(L)  AST 15 - 41 U/L 193(H) 126(H) 181(H)  ALT 0 - 44 U/L 201(H) 180(H) 225(H)  Alk Phosphatase 38 - 126 U/L 365(H) 297(H) 278(H)  Total Bilirubin 0.3 - 1.2 mg/dL 6.0(H) 5.4(H) 4.4(H)    Studies/Results: US OB Comp < 14 Wks  Result Date: 11/25/2020 CLINICAL DATA:  Pregnancy. EXAM: OBSTETRIC <14 WK ULTRASOUND TECHNIQUE: Transabdominal ultrasound was performed for evaluation of the gestation as well as the  maternal uterus and adnexal regions. COMPARISON:  None. FINDINGS: Intrauterine gestational sac: Single Yolk sac:  Visualized. Embryo:  Visualized. Cardiac Activity: Visualized. Heart Rate: 158 bpm CRL:   43.3 mm   11 w 1 d                  [place] EDC: June 15, 2021. Subchorionic hemorrhage:  None visualized. Maternal uterus/adnexae: Ovaries are unremarkable. No free fluid is noted. IMPRESSION: Single live intrauterine gestation of 11 weeks 1 day. Electronically Signed   By: Marijo Conception M.D.   On: 11/25/2020 09:54   MR ABDOMEN MRCP WO CONTRAST  Addendum Date: 11/26/2020   ADDENDUM REPORT: 11/26/2020 14:18 ADDENDUM: These results were called by telephone at the time of interpretation on 11/26/2020 at 2:17 pm to provider [facility] , who verbally acknowledged these results. Other findings relate by the radiology assistant as outlined in the Z vision dash board. Electronically Signed   By: Zetta Bills M.D.   On: 11/26/2020 14:18   Result Date: 11/26/2020 CLINICAL DATA:  RIGHT upper quadrant abdominal pain. Nondiagnostic ultrasound. EXAM: MRI ABDOMEN WITHOUT CONTRAST  (INCLUDING MRCP) TECHNIQUE: Multiplanar multisequence MR imaging of the abdomen was performed. Heavily T2-weighted images of the biliary and pancreatic ducts were obtained, and three-dimensional MRCP images were rendered by post processing. COMPARISON:  Ultrasound evaluation of the same date showing dilated  appearance of the biliary tree. FINDINGS: Lower chest: No consolidation. No pleural effusion noted at the lung bases. Hepatobiliary: Diffuse intra and extrahepatic biliary duct distension with abrupt termination in the pancreatic portion of the common bile duct. No pericholecystic fluid. Wall thickness approximately 3 mm. No filling defect in the common bile duct to suggest choledocholithiasis. Sludge in the gallbladder lumen. Low insertion of posterior division RIGHT hepatic ducts upon the biliary tree seen passing into the hepatic duct  approximately 8 mm above the cystic duct. Pancreas: Irregular pancreatic ductal distension. Loss of normal intrinsic T1 signal focally in the pancreatic head with masslike appearance, this corresponds to intermediate T2 signal. No area of restricted diffusion in this location. This area measures 2.0 x 1.9 cm.  Mild peripancreatic stranding. Spleen:  Normal size and contour.  No focal splenic lesion. Adrenals/Urinary Tract:  Adrenal glands are normal.  The No sign of hydronephrosis.  Smooth renal contours. Stomach/Bowel: No acute gastrointestinal process to the extent evaluated. Vascular/Lymphatic: Limited assessment on noncontrast imaging. No adenopathy. No signs of aneurysmal dilation of the abdominal aorta. Other:  No ascites. Musculoskeletal: No suspicious bone lesions identified. IMPRESSION: 1. Double duct sign with masslike area in the head of the pancreas, suspicious for pancreatic neoplasm. It is possible that if there is a history of repeated pancreatitis is could represent sequela of groove pancreatitis and stricture though the abrupt termination has a more malignant rather than benign appearance. CT with intravenous contrast of the abdomen only may be helpful for further evaluation to exclude the possibility of pancreatic mass and guide further evaluation such as endoscopic ultrasound or ERCP. 2. Pancreatic ductal distension and mild relative atrophy. 3. Mild gallbladder wall thickening with mild diffusion abnormality associated with the gallbladder wall. Findings are nonspecific and could even be related to chronic cholecystitis in the setting of biliary obstruction, equivocal for acute biliary pathology. Contrasted imaging could also be helpful to assess for signs of acute inflammation 4. Mild peripancreatic stranding may be related to an overlay of acute mild interstitial edematous pancreatitis. 5. Biliary ductal variant with posterior RIGHT hepatic ducts draining just above the cystic duct confluence  with the common bile duct. 6. Sludge in the gallbladder lumen. 7. Mild peripancreatic stranding. A call is out to the referring provider to further discuss findings in the above case. Electronically Signed: By: Zetta Bills M.D. On: 11/26/2020 13:53   MR 3D Recon At Scanner  Addendum Date: 11/26/2020   ADDENDUM REPORT: 11/26/2020 14:18 ADDENDUM: These results were called by telephone at the time of interpretation on 11/26/2020 at 2:17 pm to provider [facility] , who verbally acknowledged these results. Other findings relate by the radiology assistant as outlined in the Z vision dash board. Electronically Signed   By: Zetta Bills M.D.   On: 11/26/2020 14:18   Result Date: 11/26/2020 CLINICAL DATA:  RIGHT upper quadrant abdominal pain. Nondiagnostic ultrasound. EXAM: MRI ABDOMEN WITHOUT CONTRAST  (INCLUDING MRCP) TECHNIQUE: Multiplanar multisequence MR imaging of the abdomen was performed. Heavily T2-weighted images of the biliary and pancreatic ducts were obtained, and three-dimensional MRCP images were rendered by post processing. COMPARISON:  Ultrasound evaluation of the same date showing dilated appearance of the biliary tree. FINDINGS: Lower chest: No consolidation. No pleural effusion noted at the lung bases. Hepatobiliary: Diffuse intra and extrahepatic biliary duct distension with abrupt termination in the pancreatic portion of the common bile duct. No pericholecystic fluid. Wall thickness approximately 3 mm. No filling defect in the common bile duct to  suggest choledocholithiasis. Sludge in the gallbladder lumen. Low insertion of posterior division RIGHT hepatic ducts upon the biliary tree seen passing into the hepatic duct approximately 8 mm above the cystic duct. Pancreas: Irregular pancreatic ductal distension. Loss of normal intrinsic T1 signal focally in the pancreatic head with masslike appearance, this corresponds to intermediate T2 signal. No area of restricted diffusion in this  location. This area measures 2.0 x 1.9 cm.  Mild peripancreatic stranding. Spleen:  Normal size and contour.  No focal splenic lesion. Adrenals/Urinary Tract:  Adrenal glands are normal.  The No sign of hydronephrosis.  Smooth renal contours. Stomach/Bowel: No acute gastrointestinal process to the extent evaluated. Vascular/Lymphatic: Limited assessment on noncontrast imaging. No adenopathy. No signs of aneurysmal dilation of the abdominal aorta. Other:  No ascites. Musculoskeletal: No suspicious bone lesions identified. IMPRESSION: 1. Double duct sign with masslike area in the head of the pancreas, suspicious for pancreatic neoplasm. It is possible that if there is a history of repeated pancreatitis is could represent sequela of groove pancreatitis and stricture though the abrupt termination has a more malignant rather than benign appearance. CT with intravenous contrast of the abdomen only may be helpful for further evaluation to exclude the possibility of pancreatic mass and guide further evaluation such as endoscopic ultrasound or ERCP. 2. Pancreatic ductal distension and mild relative atrophy. 3. Mild gallbladder wall thickening with mild diffusion abnormality associated with the gallbladder wall. Findings are nonspecific and could even be related to chronic cholecystitis in the setting of biliary obstruction, equivocal for acute biliary pathology. Contrasted imaging could also be helpful to assess for signs of acute inflammation 4. Mild peripancreatic stranding may be related to an overlay of acute mild interstitial edematous pancreatitis. 5. Biliary ductal variant with posterior RIGHT hepatic ducts draining just above the cystic duct confluence with the common bile duct. 6. Sludge in the gallbladder lumen. 7. Mild peripancreatic stranding. A call is out to the referring provider to further discuss findings in the above case. Electronically Signed: By: Zetta Bills M.D. On: 11/26/2020 13:53    Assessment /  Plan:   Assessment: 1.  Elevated LFTs: Thought related to gallstone pancreatitis, some concern given recent MRI showing defect in the common bile duct, LFTs continue to rise, recommendations for EUS for further evaluation 2.  Likely gallstone pancreatitis 3.  Diarrhea 4.  [redacted] weeks pregnant  Plan: 1.  After MRCP yesterday radiology had recommended a CT with contrast, but later decided that because the patient was pregnant they did not want to proceed with this.  A safer option is EUS.  We have discussed case with Dr. Rush Landmark who is unable to do this this week, recommendations are to transfer her to an academic center where she can get this done sooner.  2.  Continue to monitor LFTs while here 3.  Advance patient's diet to full liquids today 4.  Please await further recommendations from Dr. Tarri Glenn later today  Thank you for kind consultation.  We will continue to follow while she is hospitalized here.   LOS: 1 day   Levin Erp  11/27/2020, 9:18 AM  Addendum:  10:00 am spoke with [facility] in regards to transfer for ERCP and EUS.  I am awaiting phone call back from them.  JLL  Addendum:  [id number] am spoke with [facility] they are completely full and do not expect transfer before the weekend for sure and it could be 10 days. Will have to discuss with Dr. Tarri Glenn  to decide on next steps.  JLL  Addendum:  12:15 after speaking with our ERCP specialists recommendation is to wait for procedures.  We are going to schedule the patient for an EUS with Dr. Rush Landmark on Tuesday, 12/03/2020.  Please await further recommendations from Dr. Tarri Glenn later today.  JLL  Addendum:  12:56 we have found a way for patient to have EUS tomorrow afternoon with Dr. Rush Landmark. She is scheduled as of now. Heparin has been held.  JLL

## 2020-11-27 NOTE — Progress Notes (Signed)
PROGRESS NOTE   Cynthia [name]  WUJ:[id number]    DOB: March 09, 1981    DOA: 11/25/2020  PCP: [facility]     Chief Complaint  Patient presents with  . Abdominal Pain  . Diarrhea    Brief Narrative:  40 year old female with medical history significant for but not limited to pulmonary embolism, tobacco abuse, [redacted] weeks pregnant with her ninth child who presented to the ED with complaints of RUQ/epigastric abdominal pain since 3/9, worse with oral intake, associated multiple watery diarrhea.  Admitted for suspected gallstone pancreatitis.  [facility] consulted and evaluating, getting MRI/MRCP.  Treating supportively.   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  Labs on admission: Lipase 304, alkaline phosphatase 278, AST 181, ALT 225 and total bilirubin 4.4.  RUQ ultrasound: Gallbladder distended with sludge, possibly with floating punctate stones.  No gallbladder wall thickening.  Dilated CBD and suspicion of early intrahepatic biliary ductal dilatation.  This constellation suggests choledocholithiasis.  Patient was given supportive treatment with IV fluids and maintained n.p.o.  Was given pain medications.  Lipase level noted to be fluctuating.  LFTs remain elevated.    Gastroenterology is following.  EUS is being considered.    From a symptom standpoint patient seems to be stable.    Bilirubin and alkaline phosphatase level also noted to be elevated suggesting obstruction.  Diarrhea, self-limited  May be related to acute pancreatitis versus acute viral  Hypoglycemia  Likely due to n.p.o. status.  She was changed over to D5 infusion with improvement.    Hypokalemia/hypomagnesemia  Repleted.  [redacted] weeks pregnant  Confirmed by ultrasound  OB MD input appreciated.  Pregnancy is previable, no fetal intervention indicated and recommend full intervention for primary issue  as per GI.  Also recommend that if she needs cholecystectomy, recommend planning for second trimester (14-28 weeks) but may be done at any point if necessary.  Palpitations/transient tachyarrhythmia  Early morning of 3/15, patient developed rapid palpitations but was not on the cardiac monitor.  Heart rate noted by RN to reach 180s-200s on pulse oximetry before quickly coming down.  No recurrence and asymptomatic.  Unclear etiology, could be SVT versus others.  Continue telemetry monitoring.  Check TSH.  History of PE Doesn't appear to be a recent history.  Not noted to be on anticoagulation.  Normocytic anemia  Stable.   DVT prophylaxis: heparin injection 5,000 Units Start: 11/25/20 1400     Code Status: Full Code Family Communication: None at bedside Disposition: Hopefully return home when improved  Status is: Inpatient  Remains inpatient appropriate because:Ongoing diagnostic testing needed not appropriate for outpatient work up, IV treatments appropriate due to intensity of illness or inability to take PO and Inpatient level of care appropriate due to severity of illness   Dispo: The patient is from: Home              Anticipated d/c is to: Home              Patient currently is not medically stable to d/c.   Difficult to place patient No         Consultants:   [facility]  Procedures:   None  Antimicrobials:    Anti-infectives (From admission, onward)   Start     Dose/Rate Route Frequency Ordered Stop   11/25/20 0830  piperacillin-tazobactam (ZOSYN) IVPB 3.375 g        3.375  g 100 mL/hr over 30 Minutes Intravenous  Once 11/25/20 0827 11/25/20 0865        Subjective:  Patient denies any abdominal pain this morning.  No nausea or vomiting.  No chest pain or shortness of breath.   Objective:   Vitals:   11/26/20 1328 11/26/20 1636 11/26/20 2015 11/27/20 0400  BP: 107/68 118/76 119/70 112/71  Pulse: 62 63 61 66  Resp: 16 16 16 16    Temp: 98.1 F (36.7 C) 98.3 F (36.8 C) 98.6 F (37 C) 98.7 F (37.1 C)  TempSrc: Oral Oral  Oral  SpO2: 100% 100% 100% 100%  Weight:      Height:        General appearance: Awake alert.  In no distress Resp: Clear to auscultation bilaterally.  Normal effort Cardio: S1-S2 is normal regular.  No S3-S4.  No rubs murmurs or bruit GI: Abdomen is soft.  Nontender nondistended.  Bowel sounds are present normal.  No masses organomegaly Extremities: No edema.  Full range of motion of lower extremities. Neurologic: Alert and oriented x3.  No focal neurological deficits.     Data Reviewed:      CBC: Recent Labs  Lab 11/25/20 0723 11/26/20 0342 11/27/20 0354  WBC 6.5 6.3 6.0  NEUTROABS 4.6  --   --   HGB 11.1* 11.1* 11.7*  HCT 32.5* 32.8* 34.1*  MCV 84.4 85.9 84.8  PLT 277 265 784    Basic Metabolic Panel: Recent Labs  Lab 11/25/20 0723 11/26/20 0342 11/27/20 0354  NA 135 136 138  K 3.5 3.3* 3.8  CL 102 106 107  CO2 22 19* 23  GLUCOSE 102* 64* 95  BUN <5* 7 <5*  CREATININE 0.56 0.38* 0.45  CALCIUM 8.7* 8.7* 9.3  MG  --  1.6*  --     Liver Function Tests: Recent Labs  Lab 11/25/20 0723 11/26/20 0342 11/27/20 0354  AST 181* 126* 193*  ALT 225* 180* 201*  ALKPHOS 278* 297* 365*  BILITOT 4.4* 5.4* 6.0*  PROT 6.5 6.1* 6.8  ALBUMIN 3.4* 3.2* 3.4*    CBG: Recent Labs  Lab 11/27/20 0357 11/27/20 0741 11/27/20 1127  GLUCAP 83 103* 130*    Microbiology Studies:   Recent Results (from the past 240 hour(s))  Resp Panel by RT-PCR (Flu A&B, Covid) Nasopharyngeal Swab     Status: None   Collection Time: 11/25/20  8:51 AM   Specimen: Nasopharyngeal Swab; Nasopharyngeal(NP) swabs in vial transport medium  Result Value Ref Range Status   SARS Coronavirus 2 by RT PCR NEGATIVE NEGATIVE Final    Comment: (NOTE) SARS-CoV-2 target nucleic acids are NOT DETECTED.  The SARS-CoV-2 RNA is generally detectable in upper respiratory specimens during the acute phase  of infection. The lowest concentration of SARS-CoV-2 viral copies this assay can detect is 138 copies/mL. A negative result does not preclude SARS-Cov-2 infection and should not be used as the sole basis for treatment or other patient management decisions. A negative result may occur with  improper specimen collection/handling, submission of specimen other than nasopharyngeal swab, presence of viral mutation(s) within the areas targeted by this assay, and inadequate number of viral copies(<138 copies/mL). A negative result must be combined with clinical observations, patient history, and epidemiological information. The expected result is Negative.  Fact Sheet for Patients:  EntrepreneurPulse.com.au  Fact Sheet for Healthcare Providers:  IncredibleEmployment.be  This test is no t yet approved or cleared by the [place] and  has been authorized for  detection and/or diagnosis of SARS-CoV-2 by FDA under an Emergency Use Authorization (EUA). This EUA will remain  in effect (meaning this test can be used) for the duration of the COVID-19 declaration under Section 564(b)(1) of the Act, 21 U.S.C.section 360bbb-3(b)(1), unless the authorization is terminated  or revoked sooner.       Influenza A by PCR NEGATIVE NEGATIVE Final   Influenza B by PCR NEGATIVE NEGATIVE Final    Comment: (NOTE) The Xpert Xpress SARS-CoV-2/FLU/RSV plus assay is intended as an aid in the diagnosis of influenza from Nasopharyngeal swab specimens and should not be used as a sole basis for treatment. Nasal washings and aspirates are unacceptable for Xpert Xpress SARS-CoV-2/FLU/RSV testing.  Fact Sheet for Patients: EntrepreneurPulse.com.au  Fact Sheet for Healthcare Providers: IncredibleEmployment.be  This test is not yet approved or cleared by the [place] FDA and has been authorized for detection and/or diagnosis of  SARS-CoV-2 by FDA under an Emergency Use Authorization (EUA). This EUA will remain in effect (meaning this test can be used) for the duration of the COVID-19 declaration under Section 564(b)(1) of the Act, 21 U.S.C. section 360bbb-3(b)(1), unless the authorization is terminated or revoked.  Performed at [facility], [address], [place],  [address]      Radiology Studies:  MR ABDOMEN MRCP WO CONTRAST  Addendum Date: 11/26/2020   ADDENDUM REPORT: 11/26/2020 14:18 ADDENDUM: These results were called by telephone at the time of interpretation on 11/26/2020 at 2:17 pm to provider [facility] , who verbally acknowledged these results. Other findings relate by the radiology assistant as outlined in the Z vision dash board. Electronically Signed   By: Zetta Bills M.D.   On: 11/26/2020 14:18   Result Date: 11/26/2020 CLINICAL DATA:  RIGHT upper quadrant abdominal pain. Nondiagnostic ultrasound. EXAM: MRI ABDOMEN WITHOUT CONTRAST  (INCLUDING MRCP) TECHNIQUE: Multiplanar multisequence MR imaging of the abdomen was performed. Heavily T2-weighted images of the biliary and pancreatic ducts were obtained, and three-dimensional MRCP images were rendered by post processing. COMPARISON:  Ultrasound evaluation of the same date showing dilated appearance of the biliary tree. FINDINGS: Lower chest: No consolidation. No pleural effusion noted at the lung bases. Hepatobiliary: Diffuse intra and extrahepatic biliary duct distension with abrupt termination in the pancreatic portion of the common bile duct. No pericholecystic fluid. Wall thickness approximately 3 mm. No filling defect in the common bile duct to suggest choledocholithiasis. Sludge in the gallbladder lumen. Low insertion of posterior division RIGHT hepatic ducts upon the biliary tree seen passing into the hepatic duct approximately 8 mm above the cystic duct. Pancreas: Irregular pancreatic ductal distension. Loss of  normal intrinsic T1 signal focally in the pancreatic head with masslike appearance, this corresponds to intermediate T2 signal. No area of restricted diffusion in this location. This area measures 2.0 x 1.9 cm.  Mild peripancreatic stranding. Spleen:  Normal size and contour.  No focal splenic lesion. Adrenals/Urinary Tract:  Adrenal glands are normal.  The No sign of hydronephrosis.  Smooth renal contours. Stomach/Bowel: No acute gastrointestinal process to the extent evaluated. Vascular/Lymphatic: Limited assessment on noncontrast imaging. No adenopathy. No signs of aneurysmal dilation of the abdominal aorta. Other:  No ascites. Musculoskeletal: No suspicious bone lesions identified. IMPRESSION: 1. Double duct sign with masslike area in the head of the pancreas, suspicious for pancreatic neoplasm. It is possible that if there is a history of repeated pancreatitis is could represent sequela of groove pancreatitis and stricture though the abrupt termination has a more  malignant rather than benign appearance. CT with intravenous contrast of the abdomen only may be helpful for further evaluation to exclude the possibility of pancreatic mass and guide further evaluation such as endoscopic ultrasound or ERCP. 2. Pancreatic ductal distension and mild relative atrophy. 3. Mild gallbladder wall thickening with mild diffusion abnormality associated with the gallbladder wall. Findings are nonspecific and could even be related to chronic cholecystitis in the setting of biliary obstruction, equivocal for acute biliary pathology. Contrasted imaging could also be helpful to assess for signs of acute inflammation 4. Mild peripancreatic stranding may be related to an overlay of acute mild interstitial edematous pancreatitis. 5. Biliary ductal variant with posterior RIGHT hepatic ducts draining just above the cystic duct confluence with the common bile duct. 6. Sludge in the gallbladder lumen. 7. Mild peripancreatic stranding. A  call is out to the referring provider to further discuss findings in the above case. Electronically Signed: By: Zetta Bills M.D. On: 11/26/2020 13:53   MR 3D Recon At Scanner  Addendum Date: 11/26/2020   ADDENDUM REPORT: 11/26/2020 14:18 ADDENDUM: These results were called by telephone at the time of interpretation on 11/26/2020 at 2:17 pm to provider [facility] , who verbally acknowledged these results. Other findings relate by the radiology assistant as outlined in the Z vision dash board. Electronically Signed   By: Zetta Bills M.D.   On: 11/26/2020 14:18   Result Date: 11/26/2020 CLINICAL DATA:  RIGHT upper quadrant abdominal pain. Nondiagnostic ultrasound. EXAM: MRI ABDOMEN WITHOUT CONTRAST  (INCLUDING MRCP) TECHNIQUE: Multiplanar multisequence MR imaging of the abdomen was performed. Heavily T2-weighted images of the biliary and pancreatic ducts were obtained, and three-dimensional MRCP images were rendered by post processing. COMPARISON:  Ultrasound evaluation of the same date showing dilated appearance of the biliary tree. FINDINGS: Lower chest: No consolidation. No pleural effusion noted at the lung bases. Hepatobiliary: Diffuse intra and extrahepatic biliary duct distension with abrupt termination in the pancreatic portion of the common bile duct. No pericholecystic fluid. Wall thickness approximately 3 mm. No filling defect in the common bile duct to suggest choledocholithiasis. Sludge in the gallbladder lumen. Low insertion of posterior division RIGHT hepatic ducts upon the biliary tree seen passing into the hepatic duct approximately 8 mm above the cystic duct. Pancreas: Irregular pancreatic ductal distension. Loss of normal intrinsic T1 signal focally in the pancreatic head with masslike appearance, this corresponds to intermediate T2 signal. No area of restricted diffusion in this location. This area measures 2.0 x 1.9 cm.  Mild peripancreatic stranding. Spleen:  Normal size and  contour.  No focal splenic lesion. Adrenals/Urinary Tract:  Adrenal glands are normal.  The No sign of hydronephrosis.  Smooth renal contours. Stomach/Bowel: No acute gastrointestinal process to the extent evaluated. Vascular/Lymphatic: Limited assessment on noncontrast imaging. No adenopathy. No signs of aneurysmal dilation of the abdominal aorta. Other:  No ascites. Musculoskeletal: No suspicious bone lesions identified. IMPRESSION: 1. Double duct sign with masslike area in the head of the pancreas, suspicious for pancreatic neoplasm. It is possible that if there is a history of repeated pancreatitis is could represent sequela of groove pancreatitis and stricture though the abrupt termination has a more malignant rather than benign appearance. CT with intravenous contrast of the abdomen only may be helpful for further evaluation to exclude the possibility of pancreatic mass and guide further evaluation such as endoscopic ultrasound or ERCP. 2. Pancreatic ductal distension and mild relative atrophy. 3. Mild gallbladder wall thickening with mild diffusion abnormality associated with  the gallbladder wall. Findings are nonspecific and could even be related to chronic cholecystitis in the setting of biliary obstruction, equivocal for acute biliary pathology. Contrasted imaging could also be helpful to assess for signs of acute inflammation 4. Mild peripancreatic stranding may be related to an overlay of acute mild interstitial edematous pancreatitis. 5. Biliary ductal variant with posterior RIGHT hepatic ducts draining just above the cystic duct confluence with the common bile duct. 6. Sludge in the gallbladder lumen. 7. Mild peripancreatic stranding. A call is out to the referring provider to further discuss findings in the above case. Electronically Signed: By: Zetta Bills M.D. On: 11/26/2020 13:53     Scheduled Meds:   . heparin injection (subcutaneous)  5,000 Units Subcutaneous Q8H    Continuous  Infusions:   . dextrose 5 % with KCl 20 mEq / L 50 mL/hr at 11/27/20 0630     LOS: 1 day     [facility]    To contact the attending provider between 7A-7P or the covering provider during after hours 7P-7A, please log into the web site [email or web address] and access using universal [place] password for that web site. If you do not have the password, please call the hospital operator.  11/27/2020, 12:17 PM

## 2020-11-28 ENCOUNTER — Encounter (HOSPITAL_COMMUNITY): Payer: Self-pay | Admitting: Anesthesiology

## 2020-11-28 ENCOUNTER — Encounter (HOSPITAL_COMMUNITY)
Admission: EM | Discharge status: Against Medical Advice | Payer: Self-pay | Source: Home / Self Care | Attending: Internal Medicine

## 2020-11-28 ENCOUNTER — Encounter (HOSPITAL_COMMUNITY): Payer: Self-pay | Admitting: Internal Medicine

## 2020-11-28 LAB — COMPREHENSIVE METABOLIC PANEL
ALT: 223 U/L — ABNORMAL HIGH (ref 0–44)
AST: 259 U/L — ABNORMAL HIGH (ref 15–41)
Albumin: 3 g/dL — ABNORMAL LOW (ref 3.5–5.0)
Alkaline Phosphatase: 327 U/L — ABNORMAL HIGH (ref 38–126)
Anion gap: 11 (ref 5–15)
BUN: 5 mg/dL — ABNORMAL LOW (ref 6–20)
CO2: 19 mmol/L — ABNORMAL LOW (ref 22–32)
Calcium: 9.1 mg/dL (ref 8.9–10.3)
Chloride: 108 mmol/L (ref 98–111)
Creatinine, Ser: 0.39 mg/dL — ABNORMAL LOW (ref 0.44–1.00)
GFR, Estimated: >60 mL/min (ref >60)
Glucose, Bld: 129 mg/dL — ABNORMAL HIGH (ref 70–99)
Potassium: 3.8 mmol/L (ref 3.5–5.1)
Sodium: 138 mmol/L (ref 135–145)
Total Bilirubin: 5.4 mg/dL — ABNORMAL HIGH (ref 0.3–1.2)
Total Protein: 6.2 g/dL — ABNORMAL LOW (ref 6.5–8.1)

## 2020-11-28 LAB — CBC
HCT: 32 % — ABNORMAL LOW (ref 36.0–46.0)
Hemoglobin: 11.1 g/dL — ABNORMAL LOW (ref 12.0–15.0)
MCH: 29.1 pg (ref 26.0–34.0)
MCHC: 34.7 g/dL (ref 30.0–36.0)
MCV: 83.8 fL (ref 80.0–100.0)
Platelets: 274 10*3/uL (ref 150–400)
RBC: 3.82 MIL/uL — ABNORMAL LOW (ref 3.87–5.11)
RDW: 13.5 % (ref 11.5–15.5)
WBC: 5.4 10*3/uL (ref 4.0–10.5)
nRBC: 0 % (ref 0.0–0.2)

## 2020-11-28 LAB — PROTIME-INR
INR: 0.9 (ref 0.8–1.2)
Prothrombin Time: 12.2 seconds (ref 11.4–15.2)

## 2020-11-28 LAB — GLUCOSE, CAPILLARY
Glucose-Capillary: 139 mg/dL — ABNORMAL HIGH (ref 70–99)
Glucose-Capillary: 103 mg/dL — ABNORMAL HIGH (ref 70–99)
Glucose-Capillary: 102 mg/dL — ABNORMAL HIGH (ref 70–99)

## 2020-11-28 SURGERY — CANCELLED PROCEDURE

## 2020-11-28 SURGERY — ERCP, WITH INTERVENTION IF INDICATED
Anesthesia: General

## 2020-11-28 MED ORDER — POTASSIUM CL IN DEXTROSE 5% 20 MEQ/L IV SOLN
20.0000 meq | INTRAVENOUS | Status: DC
  Administered 2020-11-28: 20 meq via INTRAVENOUS
  Filled 2020-11-28: qty 1000

## 2020-11-28 MED ORDER — LACTATED RINGERS IV SOLN
INTRAVENOUS | Status: DC
  Administered 2020-11-28: 1000 mL via INTRAVENOUS

## 2020-11-28 NOTE — Progress Notes (Signed)
Brief GI progress note  I was asked to evaluate this patient for potential EUS/ERCP.  She has evidence of recent pancreatitis with gallbladder sludge no stones.  She has a double duct sign with rising LFTs.  She is [redacted] weeks pregnant.  MRI concerning for potential mass/lesion not clear that this is just pancreatitis.  Interestingly, patient's symptomatology was acute right upper quadrant abdominal discomfort.  Although choledocholithiasis rarely can cause a double duct sign no choledocholithiasis was noted on the MRI/MRCP.  In the setting of her rising bilirubin up into the 6 range EUS/ERCP is reasonable to consider.  I met the patient today for her planned procedures.  After further going through the results of her MRI/MRCP as well as reviewing the imaging as well as what an EUS and ERCP entails, the patient was taken aback because she did not feel she had a complete sense of what was going on.  She also is unclear if she wants to undertake the risks to herself or to her unborn fetus (currently not in viable status).  She asked me to speak with her significant other/boyfriend.  I did that this afternoon as well.  He also felt uncomfortable with the potential procedure today since he did not have a great understanding of things either.  The [facility] team has had an opportunity as well as medicine team to discuss with her previously the results of the MRI/MRCP over the course the last few days as well as the plan so not sure if this is just an emotional concerned about the procedures as well as the fetus but certainly in regards to the risks of ERCP and EUS with potential biopsy these were discussed.  The risks of an EUS including intestinal perforation, bleeding, infection, aspiration, and medication effects were discussed as was the possibility it may not give a definitive diagnosis if a biopsy is performed.  When a biopsy of the pancreas is done as part of the EUS, there is an additional risk of pancreatitis at  the rate of about 1-2%.  It was explained that procedure related pancreatitis is typically mild, although it can be severe and even life threatening, which is why we do not perform random pancreatic biopsies and only biopsy a lesion/area we feel is concerning enough to warrant the risk.  The risks of an ERCP were discussed at length, including but not limited to the risk of perforation, bleeding, abdominal pain, post-ERCP pancreatitis (while usually mild can be severe and even life threatening).   In regards to ERCP and EUS during pregnancy, ASGE suggest that these procedures can be performed safely however increased risks to the fetus in regards to radiation exposure and teratogenicity has been discussed in the past as well as fetal loss.  The risks of fetal loss or demise as result of medications administered has also been discussed.  Glucagon can be administered but is preferentially not desired if possible.  In regards to the risk of pancreatitis that go along with the EUS and ERCP if a biopsy is performed or while attempting biliary stenting/decompression can be performed but if the patient were to get pancreatitis this could cause her to have significant length of stay in the hospital or potentially have complications that could lead to miscarriage.  Sphincterotomy can be performed but extended use or current for polyp resection may increase risks for the amniotic fluid that surrounds the fetus and should be minimized as much as possible.    After extensive discussion with  the patient as well as her significant other, they have elected to not have the procedure done today.  Because the patient is hemodynamically stable and currently without a white blood cell count (although she is on antibiotics) think it is okay for her to have a day or 2 to see how things go.  I will not be available for any procedures until early next week.    If she develops further progressive LFT abnormalities or develop signs of  cholangitis then an ERCP should be considered urgently.  Transfer to one of the quaternary centers is reasonable as well if she progresses.  Happy to be available for ERCP next Monday or Tuesday as an inpatient.  Outpatient procedures are not likely to be possible next week.    Justice Britain, MD [facility] Office # [phone]

## 2020-11-28 NOTE — Progress Notes (Signed)
Patient made aware of the risks to leaving AMA. Patient signed AMA paperwork and has left AMA.

## 2020-11-28 NOTE — Progress Notes (Signed)
PROGRESS NOTE   TRIVIA HEFFELFINGER  WGN:[id number]    DOB: Jun 01, 1981    DOA: 11/25/2020  PCP: [facility]     Chief Complaint  Patient presents with  . Abdominal Pain  . Diarrhea    Brief Narrative:  40 year old female with medical history significant for but not limited to pulmonary embolism, tobacco abuse, [redacted] weeks pregnant with her ninth child who presented to the ED with complaints of RUQ/epigastric abdominal pain since 3/9, worse with oral intake, associated multiple watery diarrhea.  Admitted for suspected gallstone pancreatitis.  [facility] consulted and evaluating, getting MRI/MRCP.  Treating supportively.   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  Labs on admission: Lipase 304, alkaline phosphatase 278, AST 181, ALT 225 and total bilirubin 4.4.  Patient was given supportive treatment with IV fluids and maintained n.p.o.  Was given pain medications.  Lipase level noted to be fluctuating.  LFTs remain elevated.   Patient initially underwent right upper quadrant ultrasound.  This was followed by MRCP.  Dilated ducts were noted.  Concern was raised about mass in the pancreatic head.  Gastroenterology is following.  Plan is for EUS today.  LFTs are abnormal but stable.  Patient does not have any significant symptoms currently.  Diarrhea, self-limited  May be related to acute pancreatitis versus acute viral  Hypoglycemia  Likely due to n.p.o. status.  She was changed over to D5 infusion with improvement.  Cut back on the rate of IV fluids.  Hypokalemia/hypomagnesemia  Repleted.  [redacted] weeks pregnant  Confirmed by ultrasound  OB MD input appreciated.  Pregnancy is previable, no fetal intervention indicated and recommend full intervention for primary issue as per GI.  Also recommend that if she needs cholecystectomy, recommend planning for second trimester (14-28  weeks) but may be done at any point if necessary.  Palpitations/transient tachyarrhythmia  Early morning of 3/15, patient developed rapid palpitations but was not on the cardiac monitor.  Heart rate noted by RN to reach 180s-200s on pulse oximetry before quickly coming down.  No recurrence and asymptomatic.  Unclear etiology, could be SVT versus others.  Continue telemetry monitoring.  Check TSH.  History of PE Doesn't appear to be a recent history.  Not noted to be on anticoagulation.  Normocytic anemia  Stable.   DVT prophylaxis:      Code Status: Full Code Family Communication: None at bedside Disposition: Hopefully return home when improved  Status is: Inpatient  Remains inpatient appropriate because:Ongoing diagnostic testing needed not appropriate for outpatient work up, IV treatments appropriate due to intensity of illness or inability to take PO and Inpatient level of care appropriate due to severity of illness   Dispo: The patient is from: Home              Anticipated d/c is to: Home              Patient currently is not medically stable to d/c.   Difficult to place patient No         Consultants:   [facility]  Procedures:   None  Antimicrobials:    Anti-infectives (From admission, onward)   Start     Dose/Rate Route Frequency Ordered Stop   11/28/20 0800  Ampicillin-Sulbactam (UNASYN) 3 g in sodium chloride 0.9 % 100 mL IVPB        3 g 200 mL/hr over 30 Minutes Intravenous  Once 11/27/20 1608     11/25/20 0830  piperacillin-tazobactam (ZOSYN) IVPB 3.375 g        3.375 g 100 mL/hr over 30 Minutes Intravenous  Once 11/25/20 0827 11/25/20 1572        Subjective:   Patient not very communicative but denies any abdominal pain.  No nausea or vomiting.   Objective:   Vitals:   11/27/20 0400 11/27/20 1128 11/27/20 2028 11/28/20 0403  BP: 112/71 110/67 106/66 111/61  Pulse: 66 78 69 (!) 59  Resp: 16 20 18 18   Temp: 98.7 F (37.1  C) 98.2 F (36.8 C) 98.3 F (36.8 C) 97.9 F (36.6 C)  TempSrc: Oral Oral Oral Oral  SpO2: 100% 98% 99% 98%  Weight:      Height:        General appearance: Awake alert.  In no distress Resp: Clear to auscultation bilaterally.  Normal effort Cardio: S1-S2 is normal regular.  No S3-S4.  No rubs murmurs or bruit GI: Abdomen is soft.  Nontender nondistended.  Bowel sounds are present normal.  No masses organomegaly Extremities: No edema.  Full range of motion of lower extremities. Neurologic:  No focal neurological deficits.     Data Reviewed:      CBC: Recent Labs  Lab 11/25/20 0723 11/26/20 0342 11/27/20 0354 11/28/20 0351  WBC 6.5 6.3 6.0 5.4  NEUTROABS 4.6  --   --   --   HGB 11.1* 11.1* 11.7* 11.1*  HCT 32.5* 32.8* 34.1* 32.0*  MCV 84.4 85.9 84.8 83.8  PLT 277 265 295 620    Basic Metabolic Panel: Recent Labs  Lab 11/26/20 0342 11/27/20 0354 11/28/20 0351  NA 136 138 138  K 3.3* 3.8 3.8  CL 106 107 108  CO2 19* 23 19*  GLUCOSE 64* 95 129*  BUN 7 <5* 5*  CREATININE 0.38* 0.45 0.39*  CALCIUM 8.7* 9.3 9.1  MG 1.6*  --   --     Liver Function Tests: Recent Labs  Lab 11/26/20 0342 11/27/20 0354 11/28/20 0351  AST 126* 193* 259*  ALT 180* 201* 223*  ALKPHOS 297* 365* 327*  BILITOT 5.4* 6.0* 5.4*  PROT 6.1* 6.8 6.2*  ALBUMIN 3.2* 3.4* 3.0*    CBG: Recent Labs  Lab 11/27/20 2315 11/28/20 0359 11/28/20 0722  GLUCAP 113* 139* 103*    Microbiology Studies:   Recent Results (from the past 240 hour(s))  Resp Panel by RT-PCR (Flu A&B, Covid) Nasopharyngeal Swab     Status: None   Collection Time: 11/25/20  8:51 AM   Specimen: Nasopharyngeal Swab; Nasopharyngeal(NP) swabs in vial transport medium  Result Value Ref Range Status   SARS Coronavirus 2 by RT PCR NEGATIVE NEGATIVE Final    Comment: (NOTE) SARS-CoV-2 target nucleic acids are NOT DETECTED.  The SARS-CoV-2 RNA is generally detectable in upper respiratory specimens during the acute  phase of infection. The lowest concentration of SARS-CoV-2 viral copies this assay can detect is 138 copies/mL. A negative result does not preclude SARS-Cov-2 infection and should not be used as the sole basis for treatment or other patient management decisions. A negative result may occur with  improper specimen collection/handling, submission of specimen other than nasopharyngeal swab, presence of viral mutation(s) within the areas targeted by this assay, and inadequate number of viral copies(<138 copies/mL). A negative result must be combined with clinical observations, patient history, and epidemiological information. The expected result is Negative.  Fact Sheet for Patients:  EntrepreneurPulse.com.au  Fact Sheet for  Healthcare Providers:  IncredibleEmployment.be  This test is no t yet approved or cleared by the [place] and  has been authorized for detection and/or diagnosis of SARS-CoV-2 by FDA under an Emergency Use Authorization (EUA). This EUA will remain  in effect (meaning this test can be used) for the duration of the COVID-19 declaration under Section 564(b)(1) of the Act, 21 U.S.C.section 360bbb-3(b)(1), unless the authorization is terminated  or revoked sooner.       Influenza A by PCR NEGATIVE NEGATIVE Final   Influenza B by PCR NEGATIVE NEGATIVE Final    Comment: (NOTE) The Xpert Xpress SARS-CoV-2/FLU/RSV plus assay is intended as an aid in the diagnosis of influenza from Nasopharyngeal swab specimens and should not be used as a sole basis for treatment. Nasal washings and aspirates are unacceptable for Xpert Xpress SARS-CoV-2/FLU/RSV testing.  Fact Sheet for Patients: EntrepreneurPulse.com.au  Fact Sheet for Healthcare Providers: IncredibleEmployment.be  This test is not yet approved or cleared by the [place] FDA and has been authorized for detection and/or diagnosis of  SARS-CoV-2 by FDA under an Emergency Use Authorization (EUA). This EUA will remain in effect (meaning this test can be used) for the duration of the COVID-19 declaration under Section 564(b)(1) of the Act, 21 U.S.C. section 360bbb-3(b)(1), unless the authorization is terminated or revoked.  Performed at [facility], [place] [address], [place], [place] [address]      Radiology Studies:  MR ABDOMEN MRCP WO CONTRAST  Addendum Date: 11/26/2020   ADDENDUM REPORT: 11/26/2020 14:18 ADDENDUM: These results were called by telephone at the time of interpretation on 11/26/2020 at 2:17 pm to provider [facility] , who verbally acknowledged these results. Other findings relate by the radiology assistant as outlined in the Z vision dash board. Electronically Signed   By: Zetta Bills M.D.   On: 11/26/2020 14:18   Result Date: 11/26/2020 CLINICAL DATA:  RIGHT upper quadrant abdominal pain. Nondiagnostic ultrasound. EXAM: MRI ABDOMEN WITHOUT CONTRAST  (INCLUDING MRCP) TECHNIQUE: Multiplanar multisequence MR imaging of the abdomen was performed. Heavily T2-weighted images of the biliary and pancreatic ducts were obtained, and three-dimensional MRCP images were rendered by post processing. COMPARISON:  Ultrasound evaluation of the same date showing dilated appearance of the biliary tree. FINDINGS: Lower chest: No consolidation. No pleural effusion noted at the lung bases. Hepatobiliary: Diffuse intra and extrahepatic biliary duct distension with abrupt termination in the pancreatic portion of the common bile duct. No pericholecystic fluid. Wall thickness approximately 3 mm. No filling defect in the common bile duct to suggest choledocholithiasis. Sludge in the gallbladder lumen. Low insertion of posterior division RIGHT hepatic ducts upon the biliary tree seen passing into the hepatic duct approximately 8 mm above the cystic duct. Pancreas: Irregular pancreatic ductal distension. Loss of  normal intrinsic T1 signal focally in the pancreatic head with masslike appearance, this corresponds to intermediate T2 signal. No area of restricted diffusion in this location. This area measures 2.0 x 1.9 cm.  Mild peripancreatic stranding. Spleen:  Normal size and contour.  No focal splenic lesion. Adrenals/Urinary Tract:  Adrenal glands are normal.  The No sign of hydronephrosis.  Smooth renal contours. Stomach/Bowel: No acute gastrointestinal process to the extent evaluated. Vascular/Lymphatic: Limited assessment on noncontrast imaging. No adenopathy. No signs of aneurysmal dilation of the abdominal aorta. Other:  No ascites. Musculoskeletal: No suspicious bone lesions identified. IMPRESSION: 1. Double duct sign with masslike area in the head of the pancreas, suspicious for pancreatic neoplasm. It is possible  that if there is a history of repeated pancreatitis is could represent sequela of groove pancreatitis and stricture though the abrupt termination has a more malignant rather than benign appearance. CT with intravenous contrast of the abdomen only may be helpful for further evaluation to exclude the possibility of pancreatic mass and guide further evaluation such as endoscopic ultrasound or ERCP. 2. Pancreatic ductal distension and mild relative atrophy. 3. Mild gallbladder wall thickening with mild diffusion abnormality associated with the gallbladder wall. Findings are nonspecific and could even be related to chronic cholecystitis in the setting of biliary obstruction, equivocal for acute biliary pathology. Contrasted imaging could also be helpful to assess for signs of acute inflammation 4. Mild peripancreatic stranding may be related to an overlay of acute mild interstitial edematous pancreatitis. 5. Biliary ductal variant with posterior RIGHT hepatic ducts draining just above the cystic duct confluence with the common bile duct. 6. Sludge in the gallbladder lumen. 7. Mild peripancreatic stranding. A  call is out to the referring provider to further discuss findings in the above case. Electronically Signed: By: Zetta Bills M.D. On: 11/26/2020 13:53   MR 3D Recon At Scanner  Addendum Date: 11/26/2020   ADDENDUM REPORT: 11/26/2020 14:18 ADDENDUM: These results were called by telephone at the time of interpretation on 11/26/2020 at 2:17 pm to provider [facility] , who verbally acknowledged these results. Other findings relate by the radiology assistant as outlined in the Z vision dash board. Electronically Signed   By: Zetta Bills M.D.   On: 11/26/2020 14:18   Result Date: 11/26/2020 CLINICAL DATA:  RIGHT upper quadrant abdominal pain. Nondiagnostic ultrasound. EXAM: MRI ABDOMEN WITHOUT CONTRAST  (INCLUDING MRCP) TECHNIQUE: Multiplanar multisequence MR imaging of the abdomen was performed. Heavily T2-weighted images of the biliary and pancreatic ducts were obtained, and three-dimensional MRCP images were rendered by post processing. COMPARISON:  Ultrasound evaluation of the same date showing dilated appearance of the biliary tree. FINDINGS: Lower chest: No consolidation. No pleural effusion noted at the lung bases. Hepatobiliary: Diffuse intra and extrahepatic biliary duct distension with abrupt termination in the pancreatic portion of the common bile duct. No pericholecystic fluid. Wall thickness approximately 3 mm. No filling defect in the common bile duct to suggest choledocholithiasis. Sludge in the gallbladder lumen. Low insertion of posterior division RIGHT hepatic ducts upon the biliary tree seen passing into the hepatic duct approximately 8 mm above the cystic duct. Pancreas: Irregular pancreatic ductal distension. Loss of normal intrinsic T1 signal focally in the pancreatic head with masslike appearance, this corresponds to intermediate T2 signal. No area of restricted diffusion in this location. This area measures 2.0 x 1.9 cm.  Mild peripancreatic stranding. Spleen:  Normal size and  contour.  No focal splenic lesion. Adrenals/Urinary Tract:  Adrenal glands are normal.  The No sign of hydronephrosis.  Smooth renal contours. Stomach/Bowel: No acute gastrointestinal process to the extent evaluated. Vascular/Lymphatic: Limited assessment on noncontrast imaging. No adenopathy. No signs of aneurysmal dilation of the abdominal aorta. Other:  No ascites. Musculoskeletal: No suspicious bone lesions identified. IMPRESSION: 1. Double duct sign with masslike area in the head of the pancreas, suspicious for pancreatic neoplasm. It is possible that if there is a history of repeated pancreatitis is could represent sequela of groove pancreatitis and stricture though the abrupt termination has a more malignant rather than benign appearance. CT with intravenous contrast of the abdomen only may be helpful for further evaluation to exclude the possibility of pancreatic mass and guide further evaluation  such as endoscopic ultrasound or ERCP. 2. Pancreatic ductal distension and mild relative atrophy. 3. Mild gallbladder wall thickening with mild diffusion abnormality associated with the gallbladder wall. Findings are nonspecific and could even be related to chronic cholecystitis in the setting of biliary obstruction, equivocal for acute biliary pathology. Contrasted imaging could also be helpful to assess for signs of acute inflammation 4. Mild peripancreatic stranding may be related to an overlay of acute mild interstitial edematous pancreatitis. 5. Biliary ductal variant with posterior RIGHT hepatic ducts draining just above the cystic duct confluence with the common bile duct. 6. Sludge in the gallbladder lumen. 7. Mild peripancreatic stranding. A call is out to the referring provider to further discuss findings in the above case. Electronically Signed: By: Zetta Bills M.D. On: 11/26/2020 13:53     Scheduled Meds:     Continuous Infusions:   . sodium chloride    . sodium chloride    .  ampicillin-sulbactam (UNASYN) 3 g IVPB (Mini-Bag Plus)    . dextrose 5 % with KCl 20 mEq / L 50 mL/hr at 11/27/20 0630     LOS: 2 days     Bonnielee Haff [facility]    To contact the attending provider between 7A-7P or the covering provider during after hours 7P-7A, please log into the web site [email or web address] and access using universal [place] password for that web site. If you do not have the password, please call the hospital operator.  11/28/2020, 10:00 AM

## 2020-11-28 NOTE — Progress Notes (Signed)
Patient in pre op for endo procedure, after lengthy conversations with MD Mansouraty and her family the patient decided to no pursue procedure at this time. Patient taken back to inpt room.

## 2020-11-28 NOTE — Discharge Summary (Signed)
[facility]  Physician Discharge Summary   Patient ID: JOLICIA DELIRA MRN: [id number] DOB/AGE: 1981-08-12 40 y.o.  Admit date: 11/25/2020 Discharge date: 11/29/2020  PCP: [facility]  DISCHARGE DIAGNOSES:  Acute pancreatitis Concern for mass in the pancreas Obstructive jaundice 11 weeks pregnancy Transient tachyarrhythmia   RECOMMENDATIONS FOR OUTPATIENT FOLLOW UP:  PATIENT LEFT AGAINST MEDICAL ADVICE    INITIAL HISTORY: 40 year old female with medical history significant for but not limited to pulmonary embolism, tobacco abuse, [redacted] weeks pregnant with her ninth child who presented to the ED with complaints of RUQ/epigastric abdominal pain since 3/9, worse with oral intake, associated multiple watery diarrhea.  Admitted for suspected gallstone pancreatitis.  [facility] consulted and evaluating, getting MRI/MRCP.  Treating supportively.  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  Labs on admission: Lipase 304, alkaline phosphatase 278, AST 181, ALT 225 and total bilirubin 4.4.  Patient was given supportive treatment with IV fluids and maintained n.p.o.  Was given pain medications.  Lipase level noted to be fluctuating.  LFTs remain elevated.   Patient initially underwent right upper quadrant ultrasound.  This was followed by MRCP.  Dilated ducts were noted.  Concern was raised about mass in the pancreatic head.  Gastroenterology was following.  Plan was to do EUS.   Patient was taken down for the procedure however she and her fianc had second thoughts due to the risks to her pregnancy.  She decided not to undergo the procedure.  Subsequently patient requested that she be discharged.  However based on specialist input as well as her labs it was felt the patient was not ready for discharge.  She  would benefit from further inpatient work-up.  However patient is unwilling to stay in the hospital and decided to leave [place].  Diarrhea, self-limited  May be related to acute pancreatitis versus acute viral  Hypoglycemia  Likely due to n.p.o. status.  She was changed over to D5 infusion with improvement.    Hypokalemia/hypomagnesemia  Repleted.  [redacted] weeks pregnant  Confirmed by ultrasound  OB MD input appreciated.  Pregnancy is previable, no fetal intervention indicated and recommend full intervention for primary issue as per GI.  Also recommend that if she needs cholecystectomy, recommend planning for second trimester (14-28 weeks) but may be done at any point if necessary.  Palpitations/transient tachyarrhythmia  Early morning of 3/15, patient developed rapid palpitations but was not on the cardiac monitor.  Heart rate noted by RN to reach 180s-200s on pulse oximetry before quickly coming down.  No recurrence and asymptomatic.  Unclear etiology, could be SVT versus others.  Patient left before TSH could be checked.  History of PE Doesn't appear to be a recent history.  Not noted to be on anticoagulation.  Normocytic anemia  Stable.   PATIENT LEFT AGAINST MEDICAL ADVICE   PERTINENT LABS:  The results of significant diagnostics from this hospitalization (including imaging, microbiology, ancillary and laboratory) are listed below for reference.    Microbiology: Recent Results (from the past 240 hour(s))  Resp Panel by RT-PCR (Flu A&B, Covid) Nasopharyngeal Swab     Status: None   Collection Time: 11/25/20  8:51 AM   Specimen: Nasopharyngeal Swab; Nasopharyngeal(NP) swabs in vial transport medium  Result Value Ref Range Status   SARS Coronavirus 2 by RT PCR NEGATIVE NEGATIVE Final    Comment: (NOTE)  SARS-CoV-2 target nucleic acids are NOT DETECTED.  The SARS-CoV-2 RNA is generally detectable in upper respiratory specimens during the  acute phase of infection. The lowest concentration of SARS-CoV-2 viral copies this assay can detect is 138 copies/mL. A negative result does not preclude SARS-Cov-2 infection and should not be used as the sole basis for treatment or other patient management decisions. A negative result may occur with  improper specimen collection/handling, submission of specimen other than nasopharyngeal swab, presence of viral mutation(s) within the areas targeted by this assay, and inadequate number of viral copies(<138 copies/mL). A negative result must be combined with clinical observations, patient history, and epidemiological information. The expected result is Negative.  Fact Sheet for Patients:  EntrepreneurPulse.com.au  Fact Sheet for Healthcare Providers:  IncredibleEmployment.be  This test is no t yet approved or cleared by the [place] FDA and  has been authorized for detection and/or diagnosis of SARS-CoV-2 by FDA under an Emergency Use Authorization (EUA). This EUA will remain  in effect (meaning this test can be used) for the duration of the COVID-19 declaration under Section 564(b)(1) of the Act, 21 U.S.C.section 360bbb-3(b)(1), unless the authorization is terminated  or revoked sooner.       Influenza A by PCR NEGATIVE NEGATIVE Final   Influenza B by PCR NEGATIVE NEGATIVE Final    Comment: (NOTE) The Xpert Xpress SARS-CoV-2/FLU/RSV plus assay is intended as an aid in the diagnosis of influenza from Nasopharyngeal swab specimens and should not be used as a sole basis for treatment. Nasal washings and aspirates are unacceptable for Xpert Xpress SARS-CoV-2/FLU/RSV testing.  Fact Sheet for Patients: EntrepreneurPulse.com.au  Fact Sheet for Healthcare Providers: IncredibleEmployment.be  This test is not yet approved or cleared by the [place] FDA and has been authorized for detection and/or  diagnosis of SARS-CoV-2 by FDA under an Emergency Use Authorization (EUA). This EUA will remain in effect (meaning this test can be used) for the duration of the COVID-19 declaration under Section 564(b)(1) of the Act, 21 U.S.C. section 360bbb-3(b)(1), unless the authorization is terminated or revoked.  Performed at [facility], [place] [address] [place], [place] [address]      Labs:  COVID-19 Labs   Lab Results  Component Value Date   [place] NEGATIVE 11/25/2020      Basic Metabolic Panel: Recent Labs  Lab 11/25/20 0723 11/26/20 0342 11/27/20 0354 11/28/20 0351  NA 135 136 138 138  K 3.5 3.3* 3.8 3.8  CL 102 106 107 108  CO2 22 19* 23 19*  GLUCOSE 102* 64* 95 129*  BUN <5* 7 <5* 5*  CREATININE 0.56 0.38* 0.45 0.39*  CALCIUM 8.7* 8.7* 9.3 9.1  MG  --  1.6*  --   --    Liver Function Tests: Recent Labs  Lab 11/25/20 0723 11/26/20 0342 11/27/20 0354 11/28/20 0351  AST 181* 126* 193* 259*  ALT 225* 180* 201* 223*  ALKPHOS 278* 297* 365* 327*  BILITOT 4.4* 5.4* 6.0* 5.4*  PROT 6.5 6.1* 6.8 6.2*  ALBUMIN 3.4* 3.2* 3.4* 3.0*   Recent Labs  Lab 11/25/20 0723 11/26/20 0342 11/27/20 0354  LIPASE 304* 80* 122*   CBC: Recent Labs  Lab 11/25/20 0723 11/26/20 0342 11/27/20 0354 11/28/20 0351  WBC 6.5 6.3 6.0 5.4  NEUTROABS 4.6  --   --   --   HGB 11.1* 11.1* 11.7* 11.1*  HCT 32.5* 32.8* 34.1* 32.0*  MCV 84.4 85.9 84.8 83.8  PLT 277 265 295 274  CBG: Recent Labs  Lab 11/27/20 2026 11/27/20 2315 11/28/20 0359 11/28/20 0722 11/28/20 1133  GLUCAP 129* 113* 139* 103* 102*     IMAGING STUDIES US OB Comp < 14 Wks  Result Date: 11/25/2020 CLINICAL DATA:  Pregnancy. EXAM: OBSTETRIC <14 WK ULTRASOUND TECHNIQUE: Transabdominal ultrasound was performed for evaluation of the gestation as well as the maternal uterus and adnexal regions. COMPARISON:  None. FINDINGS: Intrauterine gestational sac: Single Yolk sac:  Visualized.  Embryo:  Visualized. Cardiac Activity: Visualized. Heart Rate: 158 bpm CRL:   43.3 mm   11 w 1 d                  [place] EDC: June 15, 2021. Subchorionic hemorrhage:  None visualized. Maternal uterus/adnexae: Ovaries are unremarkable. No free fluid is noted. IMPRESSION: Single live intrauterine gestation of 11 weeks 1 day. Electronically Signed   By: Marijo Conception M.D.   On: 11/25/2020 09:54   MR ABDOMEN MRCP WO CONTRAST  Addendum Date: 11/26/2020   ADDENDUM REPORT: 11/26/2020 14:18 ADDENDUM: These results were called by telephone at the time of interpretation on 11/26/2020 at 2:17 pm to provider [facility] , who verbally acknowledged these results. Other findings relate by the radiology assistant as outlined in the Z vision dash board. Electronically Signed   By: Zetta Bills M.D.   On: 11/26/2020 14:18   Result Date: 11/26/2020 CLINICAL DATA:  RIGHT upper quadrant abdominal pain. Nondiagnostic ultrasound. EXAM: MRI ABDOMEN WITHOUT CONTRAST  (INCLUDING MRCP) TECHNIQUE: Multiplanar multisequence MR imaging of the abdomen was performed. Heavily T2-weighted images of the biliary and pancreatic ducts were obtained, and three-dimensional MRCP images were rendered by post processing. COMPARISON:  Ultrasound evaluation of the same date showing dilated appearance of the biliary tree. FINDINGS: Lower chest: No consolidation. No pleural effusion noted at the lung bases. Hepatobiliary: Diffuse intra and extrahepatic biliary duct distension with abrupt termination in the pancreatic portion of the common bile duct. No pericholecystic fluid. Wall thickness approximately 3 mm. No filling defect in the common bile duct to suggest choledocholithiasis. Sludge in the gallbladder lumen. Low insertion of posterior division RIGHT hepatic ducts upon the biliary tree seen passing into the hepatic duct approximately 8 mm above the cystic duct. Pancreas: Irregular pancreatic ductal distension. Loss of normal intrinsic T1 signal  focally in the pancreatic head with masslike appearance, this corresponds to intermediate T2 signal. No area of restricted diffusion in this location. This area measures 2.0 x 1.9 cm.  Mild peripancreatic stranding. Spleen:  Normal size and contour.  No focal splenic lesion. Adrenals/Urinary Tract:  Adrenal glands are normal.  The No sign of hydronephrosis.  Smooth renal contours. Stomach/Bowel: No acute gastrointestinal process to the extent evaluated. Vascular/Lymphatic: Limited assessment on noncontrast imaging. No adenopathy. No signs of aneurysmal dilation of the abdominal aorta. Other:  No ascites. Musculoskeletal: No suspicious bone lesions identified. IMPRESSION: 1. Double duct sign with masslike area in the head of the pancreas, suspicious for pancreatic neoplasm. It is possible that if there is a history of repeated pancreatitis is could represent sequela of groove pancreatitis and stricture though the abrupt termination has a more malignant rather than benign appearance. CT with intravenous contrast of the abdomen only may be helpful for further evaluation to exclude the possibility of pancreatic mass and guide further evaluation such as endoscopic ultrasound or ERCP. 2. Pancreatic ductal distension and mild relative atrophy. 3. Mild gallbladder wall thickening with mild diffusion abnormality associated with the gallbladder wall. Findings are  nonspecific and could even be related to chronic cholecystitis in the setting of biliary obstruction, equivocal for acute biliary pathology. Contrasted imaging could also be helpful to assess for signs of acute inflammation 4. Mild peripancreatic stranding may be related to an overlay of acute mild interstitial edematous pancreatitis. 5. Biliary ductal variant with posterior RIGHT hepatic ducts draining just above the cystic duct confluence with the common bile duct. 6. Sludge in the gallbladder lumen. 7. Mild peripancreatic stranding. A call is out to the referring  provider to further discuss findings in the above case. Electronically Signed: By: Zetta Bills M.D. On: 11/26/2020 13:53   MR 3D Recon At Scanner  Addendum Date: 11/26/2020   ADDENDUM REPORT: 11/26/2020 14:18 ADDENDUM: These results were called by telephone at the time of interpretation on 11/26/2020 at 2:17 pm to provider [facility] , who verbally acknowledged these results. Other findings relate by the radiology assistant as outlined in the Z vision dash board. Electronically Signed   By: Zetta Bills M.D.   On: 11/26/2020 14:18   Result Date: 11/26/2020 CLINICAL DATA:  RIGHT upper quadrant abdominal pain. Nondiagnostic ultrasound. EXAM: MRI ABDOMEN WITHOUT CONTRAST  (INCLUDING MRCP) TECHNIQUE: Multiplanar multisequence MR imaging of the abdomen was performed. Heavily T2-weighted images of the biliary and pancreatic ducts were obtained, and three-dimensional MRCP images were rendered by post processing. COMPARISON:  Ultrasound evaluation of the same date showing dilated appearance of the biliary tree. FINDINGS: Lower chest: No consolidation. No pleural effusion noted at the lung bases. Hepatobiliary: Diffuse intra and extrahepatic biliary duct distension with abrupt termination in the pancreatic portion of the common bile duct. No pericholecystic fluid. Wall thickness approximately 3 mm. No filling defect in the common bile duct to suggest choledocholithiasis. Sludge in the gallbladder lumen. Low insertion of posterior division RIGHT hepatic ducts upon the biliary tree seen passing into the hepatic duct approximately 8 mm above the cystic duct. Pancreas: Irregular pancreatic ductal distension. Loss of normal intrinsic T1 signal focally in the pancreatic head with masslike appearance, this corresponds to intermediate T2 signal. No area of restricted diffusion in this location. This area measures 2.0 x 1.9 cm.  Mild peripancreatic stranding. Spleen:  Normal size and contour.  No focal splenic lesion.  Adrenals/Urinary Tract:  Adrenal glands are normal.  The No sign of hydronephrosis.  Smooth renal contours. Stomach/Bowel: No acute gastrointestinal process to the extent evaluated. Vascular/Lymphatic: Limited assessment on noncontrast imaging. No adenopathy. No signs of aneurysmal dilation of the abdominal aorta. Other:  No ascites. Musculoskeletal: No suspicious bone lesions identified. IMPRESSION: 1. Double duct sign with masslike area in the head of the pancreas, suspicious for pancreatic neoplasm. It is possible that if there is a history of repeated pancreatitis is could represent sequela of groove pancreatitis and stricture though the abrupt termination has a more malignant rather than benign appearance. CT with intravenous contrast of the abdomen only may be helpful for further evaluation to exclude the possibility of pancreatic mass and guide further evaluation such as endoscopic ultrasound or ERCP. 2. Pancreatic ductal distension and mild relative atrophy. 3. Mild gallbladder wall thickening with mild diffusion abnormality associated with the gallbladder wall. Findings are nonspecific and could even be related to chronic cholecystitis in the setting of biliary obstruction, equivocal for acute biliary pathology. Contrasted imaging could also be helpful to assess for signs of acute inflammation 4. Mild peripancreatic stranding may be related to an overlay of acute mild interstitial edematous pancreatitis. 5. Biliary ductal variant with posterior  RIGHT hepatic ducts draining just above the cystic duct confluence with the common bile duct. 6. Sludge in the gallbladder lumen. 7. Mild peripancreatic stranding. A call is out to the referring provider to further discuss findings in the above case. Electronically Signed: By: Zetta Bills M.D. On: 11/26/2020 13:53   US Abdomen Limited RUQ (LIVER/GB)  Result Date: 11/25/2020 CLINICAL DATA:  40 year old female with right upper quadrant pain for several weeks.  EXAM: ULTRASOUND ABDOMEN LIMITED RIGHT UPPER QUADRANT COMPARISON:  CTA chest 05/25/2015. FINDINGS: Gallbladder: Gallbladder lumen appears filled with echogenic sludge, possibly with punctate floating stones (image 10). Wall thickness remains normal. No pericholecystic fluid. Equivocal sonographic Murphy sign. Common bile duct: Diameter: Up to 12 mm, dilated. No filling defect identified in the visible duct (image 21). Liver: I do suspect a degree of intrahepatic biliary ductal dilatation. No discrete liver lesion identified. Liver echogenicity at the upper limits of normal. Portal vein is patent on color Doppler imaging with normal direction of blood flow towards the liver. Other: Negative visible right kidney.  No free fluid. IMPRESSION: Gallbladder distended with sludge, possibly with floating punctate stones. No gallbladder wall thickening but dilated common bile duct and suspicion of early intrahepatic biliary ductal dilatation. This constellation suggests Choledocholithiasis. Recommend further evaluation. Electronically Signed   By: Genevie Ann M.D.   On: 11/25/2020 07:54     [facility] Pager on [email or web address]  11/29/2020, 1:25 PM

## 2020-11-28 NOTE — Progress Notes (Signed)
   11/26/20 0433  Assess: MEWS Score  Temp 98.3 F (36.8 C)  BP 114/85  Pulse Rate (!) 153  Resp 20  SpO2 100 %  O2 Device Room Air  Patient Activity (if Appropriate) In bed  Assess: MEWS Score  MEWS Temp 0  MEWS Systolic 0  MEWS Pulse 3  MEWS RR 0  MEWS LOC 0  MEWS Score 3  MEWS Score Color Yellow  Assess: if the MEWS score is Yellow or Red  Were vital signs taken at a resting state? Yes  Focused Assessment Change from prior assessment (see assessment flowsheet)  Early Detection of Sepsis Score *See Row Information* Low  MEWS guidelines implemented *See Row Information* Yes  Treat  MEWS Interventions Escalated (See documentation below)  Pain Scale 0-10  Pain Score 0  Body Language 0  Consolability 0  Take Vital Signs  Increase Vital Sign Frequency  Yellow: Q 2hr X 2 then Q 4hr X 2, if remains yellow, continue Q 4hrs  Escalate  MEWS: Escalate Yellow: discuss with charge nurse/RN and consider discussing with provider and RRT  Notify: Charge Nurse/RN  Name of Charge Nurse/RN Notified Tom, RN  Date Charge Nurse/RN Notified 11/26/20  Time Charge Nurse/RN Notified 0500  Notify: Provider  Provider Name/Title Dr. Myna Hidalgo  Date Provider Notified 11/26/20  Time Provider Notified [phone]  Notification Type Call  Notification Reason Change in status  Provider response See new orders;At bedside  Date of Provider Response 11/26/20  Document  Patient Outcome Stabilized after interventions  Progress note created (see row info) Yes

## 2020-11-29 ENCOUNTER — Inpatient Hospital Stay (HOSPITAL_COMMUNITY)
Admission: EM | Admit: 2020-11-29 | Discharge: 2020-12-05 | DRG: 831 | Disposition: A | Discharge status: Home / Self Care | Payer: Medicaid Other | Attending: Internal Medicine | Admitting: Internal Medicine

## 2020-11-29 ENCOUNTER — Other Ambulatory Visit: Payer: Self-pay

## 2020-11-29 ENCOUNTER — Encounter (HOSPITAL_COMMUNITY): Payer: Self-pay | Admitting: Emergency Medicine

## 2020-11-29 DIAGNOSIS — K831 Obstruction of bile duct: Secondary | ICD-10-CM | POA: Diagnosis present

## 2020-11-29 DIAGNOSIS — C250 Malignant neoplasm of head of pancreas: Secondary | ICD-10-CM | POA: Diagnosis present

## 2020-11-29 DIAGNOSIS — Z79899 Other long term (current) drug therapy: Secondary | ICD-10-CM

## 2020-11-29 DIAGNOSIS — R17 Unspecified jaundice: Secondary | ICD-10-CM

## 2020-11-29 DIAGNOSIS — O99284 Endocrine, nutritional and metabolic diseases complicating childbirth: Secondary | ICD-10-CM | POA: Diagnosis present

## 2020-11-29 DIAGNOSIS — O9A111 Malignant neoplasm complicating pregnancy, first trimester: Secondary | ICD-10-CM | POA: Diagnosis present

## 2020-11-29 DIAGNOSIS — Z20822 Contact with and (suspected) exposure to covid-19: Secondary | ICD-10-CM | POA: Diagnosis present

## 2020-11-29 DIAGNOSIS — Z86711 Personal history of pulmonary embolism: Secondary | ICD-10-CM | POA: Diagnosis present

## 2020-11-29 DIAGNOSIS — R7401 Elevation of levels of liver transaminase levels: Secondary | ICD-10-CM | POA: Diagnosis present

## 2020-11-29 DIAGNOSIS — O26891 Other specified pregnancy related conditions, first trimester: Principal | ICD-10-CM | POA: Diagnosis present

## 2020-11-29 DIAGNOSIS — F17210 Nicotine dependence, cigarettes, uncomplicated: Secondary | ICD-10-CM | POA: Diagnosis present

## 2020-11-29 DIAGNOSIS — Z3A14 14 weeks gestation of pregnancy: Secondary | ICD-10-CM

## 2020-11-29 DIAGNOSIS — K8689 Other specified diseases of pancreas: Secondary | ICD-10-CM | POA: Diagnosis present

## 2020-11-29 DIAGNOSIS — B9681 Helicobacter pylori [H. pylori] as the cause of diseases classified elsewhere: Secondary | ICD-10-CM | POA: Diagnosis present

## 2020-11-29 DIAGNOSIS — Z3491 Encounter for supervision of normal pregnancy, unspecified, first trimester: Secondary | ICD-10-CM

## 2020-11-29 DIAGNOSIS — L299 Pruritus, unspecified: Secondary | ICD-10-CM | POA: Diagnosis present

## 2020-11-29 DIAGNOSIS — Z8249 Family history of ischemic heart disease and other diseases of the circulatory system: Secondary | ICD-10-CM

## 2020-11-29 DIAGNOSIS — Z3A12 12 weeks gestation of pregnancy: Secondary | ICD-10-CM

## 2020-11-29 DIAGNOSIS — O99011 Anemia complicating pregnancy, first trimester: Secondary | ICD-10-CM | POA: Diagnosis present

## 2020-11-29 DIAGNOSIS — K859 Acute pancreatitis without necrosis or infection, unspecified: Secondary | ICD-10-CM | POA: Diagnosis present

## 2020-11-29 DIAGNOSIS — O9902 Anemia complicating childbirth: Secondary | ICD-10-CM | POA: Diagnosis present

## 2020-11-29 DIAGNOSIS — K2950 Unspecified chronic gastritis without bleeding: Secondary | ICD-10-CM | POA: Diagnosis present

## 2020-11-29 DIAGNOSIS — O99331 Smoking (tobacco) complicating pregnancy, first trimester: Secondary | ICD-10-CM | POA: Diagnosis present

## 2020-11-29 DIAGNOSIS — R110 Nausea: Secondary | ICD-10-CM

## 2020-11-29 LAB — LIPASE, BLOOD: Lipase: 386 U/L — ABNORMAL HIGH (ref 11–51)

## 2020-11-29 LAB — CBC
HCT: 33.9 % — ABNORMAL LOW (ref 36.0–46.0)
Hemoglobin: 11.9 g/dL — ABNORMAL LOW (ref 12.0–15.0)
MCH: 29.5 pg (ref 26.0–34.0)
MCHC: 35.1 g/dL (ref 30.0–36.0)
MCV: 83.9 fL (ref 80.0–100.0)
Platelets: 289 10*3/uL (ref 150–400)
RBC: 4.04 MIL/uL (ref 3.87–5.11)
RDW: 14 % (ref 11.5–15.5)
WBC: 6.9 10*3/uL (ref 4.0–10.5)
nRBC: 0 % (ref 0.0–0.2)

## 2020-11-29 LAB — COMPREHENSIVE METABOLIC PANEL
ALT: 246 U/L — ABNORMAL HIGH (ref 0–44)
AST: 235 U/L — ABNORMAL HIGH (ref 15–41)
Albumin: 3.1 g/dL — ABNORMAL LOW (ref 3.5–5.0)
Alkaline Phosphatase: 373 U/L — ABNORMAL HIGH (ref 38–126)
Anion gap: 9 (ref 5–15)
BUN: <5 mg/dL — ABNORMAL LOW (ref 6–20)
CO2: 23 mmol/L (ref 22–32)
Calcium: 9.1 mg/dL (ref 8.9–10.3)
Chloride: 100 mmol/L (ref 98–111)
Creatinine, Ser: 0.58 mg/dL (ref 0.44–1.00)
GFR, Estimated: >60 mL/min (ref >60)
Glucose, Bld: 95 mg/dL (ref 70–99)
Potassium: 4.1 mmol/L (ref 3.5–5.1)
Sodium: 132 mmol/L — ABNORMAL LOW (ref 135–145)
Total Bilirubin: 7.2 mg/dL — ABNORMAL HIGH (ref 0.3–1.2)
Total Protein: 6.5 g/dL (ref 6.5–8.1)

## 2020-11-29 LAB — I-STAT BETA HCG BLOOD, ED (MC, WL, AP ONLY): I-stat hCG, quantitative: >2000 m[IU]/mL — ABNORMAL HIGH (ref <5)

## 2020-11-29 LAB — DIFFERENTIAL
Abs Immature Granulocytes: 0.02 10*3/uL (ref 0.00–0.07)
Basophils Absolute: 0 10*3/uL (ref 0.0–0.1)
Basophils Relative: 1 %
Eosinophils Absolute: 0.1 10*3/uL (ref 0.0–0.5)
Eosinophils Relative: 2 %
Immature Granulocytes: 0 %
Lymphocytes Relative: 22 %
Lymphs Abs: 1.5 10*3/uL (ref 0.7–4.0)
Monocytes Absolute: 0.4 10*3/uL (ref 0.1–1.0)
Monocytes Relative: 6 %
Neutro Abs: 4.9 10*3/uL (ref 1.7–7.7)
Neutrophils Relative %: 69 %

## 2020-11-29 LAB — IGG 4: IgG, Subclass 4: 14 mg/dL (ref 2–96)

## 2020-11-29 LAB — CANCER ANTIGEN 19-9: CA 19-9: 481 U/mL — ABNORMAL HIGH (ref 0–35)

## 2020-11-29 NOTE — ED Triage Notes (Addendum)
Patient is [redacted] weeks pregnant [id number] reports upper abdominal pain onset last week , no emesis or diarrhea , denies fever or chills . Denies vaginal bleeding or discharge . MAU notified / PA evaluated patient at triage .

## 2020-11-29 NOTE — ED Triage Notes (Addendum)
Emergency Medicine Provider Triage Evaluation Note  Cynthia [name] , a 40 y.o. female  was evaluated in triage.  Pt complains of upper abdominal pain for at least a week. She does not feel as though it has been worsening.  Review of Systems  Positive: Upper abdominal pain and diarrhea Negative: Fever, vomiting, hematochezia/melena, vaginal bleeding  Physical Exam  BP 128/83 (BP Location: Right Arm)   Pulse 90   Temp 98.4 F (36.9 C) (Oral)   Resp 14   Ht 5\' 5"  (1.651 m)   Wt 76 kg   LMP 01/13/2020   SpO2 100%   BMI 27.88 kg/m  Gen:   Awake, no distress HEENT:  Atraumatic  Resp:  Normal effort  Cardiac:  Normal rate  Abd:   Upper abdominal tenderness MSK:   Moves extremities without difficulty  Neuro:  Speech clear   Medical Decision Making  Medically screening exam initiated at 8:28 PM.  Appropriate orders placed.  Cynthia [name] was informed that the remainder of the evaluation will be completed by another provider, this initial triage assessment does not replace that evaluation, and the importance of remaining in the ED until their evaluation is complete.  Clinical Impression    I was asked to come see this patient in triage to possibly clear her to go to MAU. However, upon further inspection of the patient's chart, it appears as though patient has a reason for her upper abdominal pain. It appears as though patient was admitted on March 14 at [facility]. Ultrasound suggested choledocholithiasis, however, MRCP suspicious for pancreatic neoplasm.  They were prepping to perform ERCP, however, patient chose to leave AMA.  She tells me she was concerned, scared, and also was upset because they would not let her eat.  Patient is not appropriate for MAU and needs further evaluation here in our ED.   Patient discussed with EDP, Dr. Ronnald Nian.  Lorayne Bender, PA-C 11/29/20 2042    Arlean Hopping C, PA-C 11/29/20 2043

## 2020-11-30 ENCOUNTER — Encounter: Payer: Self-pay | Admitting: Gastroenterology

## 2020-11-30 DIAGNOSIS — K858 Other acute pancreatitis without necrosis or infection: Secondary | ICD-10-CM | POA: Diagnosis not present

## 2020-11-30 DIAGNOSIS — D490 Neoplasm of unspecified behavior of digestive system: Secondary | ICD-10-CM | POA: Diagnosis not present

## 2020-11-30 DIAGNOSIS — Z20822 Contact with and (suspected) exposure to covid-19: Secondary | ICD-10-CM | POA: Diagnosis present

## 2020-11-30 DIAGNOSIS — O9A111 Malignant neoplasm complicating pregnancy, first trimester: Secondary | ICD-10-CM | POA: Diagnosis present

## 2020-11-30 DIAGNOSIS — K8689 Other specified diseases of pancreas: Secondary | ICD-10-CM | POA: Diagnosis not present

## 2020-11-30 DIAGNOSIS — R7401 Elevation of levels of liver transaminase levels: Secondary | ICD-10-CM

## 2020-11-30 DIAGNOSIS — Z8249 Family history of ischemic heart disease and other diseases of the circulatory system: Secondary | ICD-10-CM | POA: Diagnosis not present

## 2020-11-30 DIAGNOSIS — Z79899 Other long term (current) drug therapy: Secondary | ICD-10-CM | POA: Diagnosis not present

## 2020-11-30 DIAGNOSIS — O99331 Smoking (tobacco) complicating pregnancy, first trimester: Secondary | ICD-10-CM | POA: Diagnosis present

## 2020-11-30 DIAGNOSIS — K8500 Idiopathic acute pancreatitis without necrosis or infection: Secondary | ICD-10-CM

## 2020-11-30 DIAGNOSIS — R17 Unspecified jaundice: Secondary | ICD-10-CM | POA: Diagnosis not present

## 2020-11-30 DIAGNOSIS — B9681 Helicobacter pylori [H. pylori] as the cause of diseases classified elsewhere: Secondary | ICD-10-CM | POA: Diagnosis present

## 2020-11-30 DIAGNOSIS — F17210 Nicotine dependence, cigarettes, uncomplicated: Secondary | ICD-10-CM

## 2020-11-30 DIAGNOSIS — O99011 Anemia complicating pregnancy, first trimester: Secondary | ICD-10-CM | POA: Diagnosis present

## 2020-11-30 DIAGNOSIS — O99611 Diseases of the digestive system complicating pregnancy, first trimester: Secondary | ICD-10-CM | POA: Diagnosis not present

## 2020-11-30 DIAGNOSIS — K859 Acute pancreatitis without necrosis or infection, unspecified: Secondary | ICD-10-CM | POA: Diagnosis not present

## 2020-11-30 DIAGNOSIS — O99891 Other specified diseases and conditions complicating pregnancy: Secondary | ICD-10-CM

## 2020-11-30 DIAGNOSIS — K831 Obstruction of bile duct: Secondary | ICD-10-CM | POA: Diagnosis present

## 2020-11-30 DIAGNOSIS — O9902 Anemia complicating childbirth: Secondary | ICD-10-CM | POA: Diagnosis present

## 2020-11-30 DIAGNOSIS — Z3A12 12 weeks gestation of pregnancy: Secondary | ICD-10-CM | POA: Diagnosis not present

## 2020-11-30 DIAGNOSIS — D649 Anemia, unspecified: Secondary | ICD-10-CM

## 2020-11-30 DIAGNOSIS — R110 Nausea: Secondary | ICD-10-CM | POA: Diagnosis present

## 2020-11-30 DIAGNOSIS — O26891 Other specified pregnancy related conditions, first trimester: Secondary | ICD-10-CM | POA: Diagnosis present

## 2020-11-30 DIAGNOSIS — K2950 Unspecified chronic gastritis without bleeding: Secondary | ICD-10-CM | POA: Diagnosis present

## 2020-11-30 DIAGNOSIS — O99284 Endocrine, nutritional and metabolic diseases complicating childbirth: Secondary | ICD-10-CM | POA: Diagnosis present

## 2020-11-30 DIAGNOSIS — Z3A14 14 weeks gestation of pregnancy: Secondary | ICD-10-CM | POA: Diagnosis not present

## 2020-11-30 DIAGNOSIS — Z86711 Personal history of pulmonary embolism: Secondary | ICD-10-CM | POA: Diagnosis not present

## 2020-11-30 DIAGNOSIS — K851 Biliary acute pancreatitis without necrosis or infection: Secondary | ICD-10-CM | POA: Diagnosis not present

## 2020-11-30 DIAGNOSIS — Z3491 Encounter for supervision of normal pregnancy, unspecified, first trimester: Secondary | ICD-10-CM | POA: Diagnosis not present

## 2020-11-30 DIAGNOSIS — Z3A11 11 weeks gestation of pregnancy: Secondary | ICD-10-CM

## 2020-11-30 DIAGNOSIS — C250 Malignant neoplasm of head of pancreas: Secondary | ICD-10-CM | POA: Diagnosis present

## 2020-11-30 DIAGNOSIS — L299 Pruritus, unspecified: Secondary | ICD-10-CM | POA: Diagnosis present

## 2020-11-30 LAB — RESP PANEL BY RT-PCR (FLU A&B, COVID) ARPGX2
Influenza A by PCR: NEGATIVE
Influenza B by PCR: NEGATIVE
SARS Coronavirus 2 by RT PCR: NEGATIVE

## 2020-11-30 LAB — URINALYSIS, ROUTINE W REFLEX MICROSCOPIC
Bacteria, UA: NONE SEEN
Glucose, UA: NEGATIVE mg/dL
Ketones, ur: 20 mg/dL — AB
Leukocytes,Ua: NEGATIVE
Nitrite: NEGATIVE
Protein, ur: 30 mg/dL — AB
Specific Gravity, Urine: 1.016 (ref 1.005–1.030)
pH: 5 (ref 5.0–8.0)

## 2020-11-30 MED ORDER — ONDANSETRON HCL 4 MG/2ML IJ SOLN
4.0000 mg | Freq: Four times a day (QID) | INTRAMUSCULAR | Status: DC | PRN
  Filled 2020-11-30: qty 2

## 2020-11-30 MED ORDER — SODIUM CHLORIDE 0.9 % IV BOLUS
1000.0000 mL | Freq: Once | INTRAVENOUS | Status: AC
  Administered 2020-11-30: 1000 mL via INTRAVENOUS

## 2020-11-30 MED ORDER — ENOXAPARIN SODIUM 40 MG/0.4ML ~~LOC~~ SOLN: 40.0000 mg | Freq: Every day | SUBCUTANEOUS | Status: DC

## 2020-11-30 MED ORDER — FENTANYL CITRATE (PF) 100 MCG/2ML IJ SOLN
25.0000 ug | INTRAMUSCULAR | Status: DC | PRN
Start: 2020-11-30 — End: 2020-12-05
  Administered 2020-12-01 – 2020-12-05 (×11): 25 ug via INTRAVENOUS
  Filled 2020-11-30 (×12): qty 2

## 2020-11-30 MED ORDER — ALBUTEROL SULFATE (2.5 MG/3ML) 0.083% IN NEBU: 2.5000 mg | INHALATION_SOLUTION | Freq: Four times a day (QID) | RESPIRATORY_TRACT | Status: DC | PRN

## 2020-11-30 MED ORDER — ONDANSETRON HCL 4 MG PO TABS: 4.0000 mg | ORAL_TABLET | Freq: Four times a day (QID) | ORAL | Status: DC | PRN

## 2020-11-30 MED ORDER — FENTANYL CITRATE (PF) 100 MCG/2ML IJ SOLN
50.0000 ug | Freq: Once | INTRAMUSCULAR | Status: AC
  Administered 2020-11-30: 50 ug via INTRAVENOUS
  Filled 2020-11-30: qty 2

## 2020-11-30 MED ORDER — NICOTINE 14 MG/24HR TD PT24
14.0000 mg | MEDICATED_PATCH | Freq: Every day | TRANSDERMAL | Status: DC
  Administered 2020-12-03 – 2020-12-05 (×3): 14 mg via TRANSDERMAL
  Filled 2020-11-30 (×4): qty 1

## 2020-11-30 MED ORDER — SODIUM CHLORIDE 0.9 % IV SOLN: INTRAVENOUS | Status: DC

## 2020-11-30 MED ORDER — HYDRALAZINE HCL 20 MG/ML IJ SOLN
10.0000 mg | Freq: Four times a day (QID) | INTRAMUSCULAR | Status: DC | PRN
Start: 2020-11-30 — End: 2020-12-05

## 2020-11-30 MED ORDER — HEPARIN SODIUM (PORCINE) 5000 UNIT/ML IJ SOLN
5000.0000 [IU] | Freq: Three times a day (TID) | INTRAMUSCULAR | Status: AC
  Administered 2020-12-01: 5000 [IU] via SUBCUTANEOUS
  Filled 2020-11-30 (×2): qty 1

## 2020-11-30 MED ORDER — SODIUM CHLORIDE 0.9% FLUSH
3.0000 mL | Freq: Two times a day (BID) | INTRAVENOUS | Status: DC
  Administered 2020-12-02: 3 mL via INTRAVENOUS

## 2020-11-30 MED ORDER — ONDANSETRON HCL 4 MG/2ML IJ SOLN
4.0000 mg | Freq: Once | INTRAMUSCULAR | Status: AC
  Administered 2020-11-30: 4 mg via INTRAVENOUS
  Filled 2020-11-30: qty 2

## 2020-11-30 NOTE — Consult Note (Addendum)
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 Rush Landmark 12/02/20 - Formal OB consultation prior to endoscopy given patient's concerns about the risks of the procedure  Please see the "Patient Instructions" section for addition details about the plan.  HPI: Cynthia [name] is a 40 y.o. female who presents to the ED today requesting endoscopic evaluation of a possible pancreatic mass. Admitted 11/25/20-11/28/20 for acute suspected biliary pancreatitis. MRCP showed possible 2.0 cm x 1.9 cm pancreatic head mass with a double duct sign without choledocholithiasis and evidence for acute pancreatitis. Elevated Ca-19-9 at 481 provided additional concern. She left AMA prior to proceeding with EUS +/-ERCP.   Returns now with ongoing, predominantly RUQ abdominal pain. She is ready to proceed with endoscopy. No fevers, chills, or night sweats. No pruritus or acholic stools.     Past Medical History:  Diagnosis Date  . History of pulmonary embolism   . Normal pregnancy in multigravida in third trimester 07/18/2018  . Tobacco abuse     Past Surgical History:  Procedure Laterality Date  . CESAREAN SECTION    . CESAREAN SECTION N/A 02/27/2015   Procedure: CESAREAN SECTION;  Surgeon: Newton Pigg, MD;  Location: [facility];  Service: Obstetrics;  Laterality: N/A;  . CESAREAN SECTION N/A 08/03/2018   Procedure: REPEAT CESAREAN SECTION;  Surgeon: Janyth Contes, MD;  Location: [facility];  Service:  Obstetrics;  Laterality: N/A;  [name], RNFA    Current Facility-Administered Medications  Medication Dose Route Frequency Provider Last Rate Last Admin  . 0.9 %  sodium chloride infusion   Intravenous Continuous Fuller Plan A, MD 150 mL/hr at 11/30/20 1000 New Bag at 11/30/20 1000  . albuterol (PROVENTIL) (2.5 MG/3ML) 0.083% nebulizer solution 2.5 mg  2.5 mg Nebulization Q6H PRN Tamala Julian, Rondell A, MD      . fentaNYL (SUBLIMAZE) injection 25 mcg  25 mcg Intravenous Q2H PRN Smith, Rondell A, MD      . heparin injection 5,000 Units  5,000 Units Subcutaneous Q8H Smith, Rondell A, MD      . hydrALAZINE (APRESOLINE) injection 10 mg  10 mg Intravenous Q6H PRN Smith, Rondell A, MD      . nicotine (NICODERM CQ - dosed in mg/24 hours) patch 14 mg  14 mg Transdermal Daily Smith, Rondell A, MD      . ondansetron (ZOFRAN) tablet 4 mg  4 mg Oral Q6H PRN Fuller Plan A, MD       Or  . ondansetron (ZOFRAN) injection 4 mg  4 mg Intravenous Q6H PRN Smith, Rondell A, MD      . sodium chloride flush (NS) 0.9 % injection 3 mL  3 mL Intravenous Q12H Smith, Rondell A, MD       Current Outpatient Medications  Medication Sig Dispense Refill  . naproxen (NAPROSYN) 500 MG tablet Take 1 tablet (500 mg total) by mouth 2 (two) times daily as needed for moderate pain. 15 tablet 0  . methocarbamol (ROBAXIN) 500 MG tablet Take 1 tablet (500 mg total) by mouth every  8 (eight) hours as needed for muscle spasms. (Patient not taking: No sig reported) 15 tablet 0    Allergies as of 11/29/2020  . (No Known Allergies)    Family History  Problem Relation Age of Onset  . Hypertension Mother   . Hypertension Father     Social History   Socioeconomic History  . Marital status: Single    Spouse name: Not on file  . Number of children: Not on file  . Years of education: Not on file  . Highest education level: Not on file  Occupational History  . Not on file  Tobacco Use  . Smoking status: Current Every Day Smoker     Packs/day: 0.50    Years: 13.00    Pack years: 6.50    Types: Cigarettes  . Smokeless tobacco: Never Used  Vaping Use  . Vaping Use: Never used  Substance and Sexual Activity  . Alcohol use: No    Alcohol/week: 0.0 standard drinks  . Drug use: No  . Sexual activity: Yes  Other Topics Concern  . Not on file  Social History Narrative  . Not on file   Social Determinants of Health   Financial Resource Strain: Not on file  Food Insecurity: Not on file  Transportation Needs: Not on file  Physical Activity: Not on file  Stress: Not on file  Social Connections: Not on file  Intimate Partner Violence: Not on file    Review of Systems: 12 system ROS is negative except as noted above.   Physical Exam: General:   Alert,  well-nourished, pleasant and cooperative in NAD Head:  Normocephalic and atraumatic. Eyes:  Sclera clear, no icterus.   Conjunctiva pink. Ears:  Normal auditory acuity. Nose:  No deformity, discharge,  or lesions. Mouth:  No deformity or lesions.   Neck:  Supple; no masses or thyromegaly. Lungs:  Clear throughout to auscultation.   No wheezes. Heart:  Regular rate and rhythm; no murmurs. Abdomen:  Soft,nontender, nondistended, normal bowel sounds, no rebound or guarding. No hepatosplenomegaly.   Rectal:  Deferred  Msk:  Symmetrical. No boney deformities LAD: No inguinal or umbilical LAD Extremities:  No clubbing or edema. Neurologic:  Alert and  oriented x4;  grossly nonfocal Skin:  Intact without significant lesions or rashes. Psych:  Alert and cooperative. Normal mood and affect.   Lab Results: Recent Labs    11/28/20 0351 11/29/20 2054  WBC 5.4 6.9  HGB 11.1* 11.9*  HCT 32.0* 33.9*  PLT 274 289   BMET Recent Labs    11/28/20 0351 11/29/20 2054  NA 138 132*  K 3.8 4.1  CL 108 100  CO2 19* 23  GLUCOSE 129* 95  BUN 5* <5*  CREATININE 0.39* 0.58  CALCIUM 9.1 9.1   LFT Recent Labs    11/29/20 2054  PROT 6.5  ALBUMIN 3.1*  AST  235*  ALT 246*  ALKPHOS 373*  BILITOT 7.2*   PT/INR Recent Labs    11/28/20 0351  LABPROT 12.2  INR 0.9      Kimberly L. Tarri Glenn, MD, MPH 11/30/2020, 5:02 PM

## 2020-11-30 NOTE — H&P (Signed)
History and Physical    Cynthia [name] HTD:[id number] DOB: [birth year] DOA: 11/29/2020  Referring MD/NP/PA: Raechel Chute, PA-C PCP: [facility]  Patient coming from: Home  Chief Complaint: Abdominal pain  I have personally briefly reviewed patient's old medical records in [place]   HPI: Cynthia [name] is a 40 y.o. female with medical history significant of tobacco abuse, history of PE, and [redacted] weeks pregnant presents with complaints of abdominal pain over the last 1-1/2 weeks.  Patient had just recently been hospitalized from 3/14-3/17 with complaints of right upper abdominal pain and was admitted for suspected gallstone pancreatitis.  During hospitalization patient was worked up with MRCP which noted concern for pancreatic mass with obstructive jaundice.  Plan was for patient undergo ERCP, but she left AGAINST MEDICAL ADVICE due to the not given any food.  Labs had noted that patient CA 19-9 was elevated up to 481 giving concern for the possibility of underlying malignancy.  She presents back to the hospital due to persistence of abdominal pain.  Complains of having achy epigastric abdominal pain that is worse on the left upper quadrant at this time.  Diarrhea symptoms have resolved.  Denies having any nausea, vomiting, shortness of breath, or chest pain.  ED Course: Upon admission into the emergency department patient was seen to be afebrile vital signs relatively within normal limits.  Labs significant for hemoglobin 11.9, sodium 132, alkaline phosphatase 373, lipase 386, AST 235, ALT 246, and total bilirubin 7.2.  [facility] had been formally consulted.  Review of Systems  Constitutional: Positive for malaise/fatigue. Negative for fever.  HENT: Negative for congestion and nosebleeds.   Eyes: Negative for photophobia and pain.  Respiratory: Negative for cough and shortness of breath.   Cardiovascular: Negative for chest pain and leg swelling.   Gastrointestinal: Positive for abdominal pain. Negative for diarrhea, nausea and vomiting.  Genitourinary: Negative for dysuria and hematuria.  Musculoskeletal: Negative for joint pain.  Skin: Negative for rash.  Neurological: Negative for focal weakness and loss of consciousness.  Psychiatric/Behavioral: Positive for substance abuse.    Past Medical History:  Diagnosis Date  . History of pulmonary embolism   . Normal pregnancy in multigravida in third trimester 07/18/2018  . Tobacco abuse     Past Surgical History:  Procedure Laterality Date  . CESAREAN SECTION    . CESAREAN SECTION N/A 02/27/2015   Procedure: CESAREAN SECTION;  Surgeon: Newton Pigg, MD;  Location: [facility];  Service: Obstetrics;  Laterality: N/A;  . CESAREAN SECTION N/A 08/03/2018   Procedure: REPEAT CESAREAN SECTION;  Surgeon: Janyth Contes, MD;  Location: [facility];  Service: Obstetrics;  Laterality: N/A;  [name], RNFA     reports that she has been smoking cigarettes. She has a 6.50 pack-year smoking history. She has never used smokeless tobacco. She reports that she does not drink alcohol and does not use drugs.  No Known Allergies  Family History  Problem Relation Age of Onset  . Hypertension Mother   . Hypertension Father     Prior to Admission medications   Medication Sig Start Date End Date Taking? Authorizing Provider  naproxen (NAPROSYN) 500 MG tablet Take 1 tablet (500 mg total) by mouth 2 (two) times daily as needed for moderate pain. 09/21/20  Yes Petrucelli, Samantha R, PA-C  methocarbamol (ROBAXIN) 500 MG tablet Take 1 tablet (500 mg total) by mouth every 8 (eight) hours as needed for muscle spasms. Patient not taking: No sig reported 09/21/20  Petrucelli, Samantha R, PA-C  enoxaparin (LOVENOX) 40 MG/0.4ML injection Inject 40 mg into the skin daily.  09/21/20  [provider]    Physical Exam:  Constitutional: Middle aged female currently in no acute distress Vitals:    11/30/20 0745 11/30/20 0800 11/30/20 0815 11/30/20 0830  BP: 121/75 108/62 (!) 103/56 124/74  Pulse: 60 (!) 59 63 68  Resp: 15 18 18 20   Temp:      TempSrc:      SpO2: 100% 100% 98% 98%  Weight:      Height:       Eyes: PERRL, scleral icterus present ENMT: Mucous membranes are moist. Posterior pharynx clear of any exudate or lesions.  Neck: normal, supple, no masses, no thyromegaly Respiratory: clear to auscultation bilaterally, no wheezing, no crackles. Normal respiratory effort. No accessory muscle use.  Cardiovascular: Regular rate and rhythm, no murmurs / rubs / gallops. No extremity edema. 2+ pedal pulses. No carotid bruits.  Abdomen: Epigastric tenderness appreciated.  Bowel sounds present. Musculoskeletal: no clubbing / cyanosis. No joint deformity upper and lower extremities. Good ROM, no contractures. Normal muscle tone.  Skin: no rashes, lesions, ulcers. No induration Neurologic: CN 2-12 grossly intact. Sensation intact, DTR normal. Strength 5/5 in all 4.  Psychiatric: Normal judgment and insight. Alert and oriented x 3. Normal mood.     Labs on Admission: I have personally reviewed following labs and imaging studies  CBC: Recent Labs  Lab 11/25/20 0723 11/26/20 0342 11/27/20 0354 11/28/20 0351 11/29/20 2054  WBC 6.5 6.3 6.0 5.4 6.9  NEUTROABS 4.6  --   --   --  4.9  HGB 11.1* 11.1* 11.7* 11.1* 11.9*  HCT 32.5* 32.8* 34.1* 32.0* 33.9*  MCV 84.4 85.9 84.8 83.8 83.9  PLT 277 265 295 274 588   Basic Metabolic Panel: Recent Labs  Lab 11/25/20 0723 11/26/20 0342 11/27/20 0354 11/28/20 0351 11/29/20 2054  NA 135 136 138 138 132*  K 3.5 3.3* 3.8 3.8 4.1  CL 102 106 107 108 100  CO2 22 19* 23 19* 23  GLUCOSE 102* 64* 95 129* 95  BUN <5* 7 <5* 5* <5*  CREATININE 0.56 0.38* 0.45 0.39* 0.58  CALCIUM 8.7* 8.7* 9.3 9.1 9.1  MG  --  1.6*  --   --   --    GFR: Estimated Creatinine Clearance: 98.2 mL/min (by C-G formula based on SCr of 0.58 mg/dL). Liver  Function Tests: Recent Labs  Lab 11/25/20 0723 11/26/20 0342 11/27/20 0354 11/28/20 0351 11/29/20 2054  AST 181* 126* 193* 259* 235*  ALT 225* 180* 201* 223* 246*  ALKPHOS 278* 297* 365* 327* 373*  BILITOT 4.4* 5.4* 6.0* 5.4* 7.2*  PROT 6.5 6.1* 6.8 6.2* 6.5  ALBUMIN 3.4* 3.2* 3.4* 3.0* 3.1*   Recent Labs  Lab 11/25/20 0723 11/26/20 0342 11/27/20 0354 11/29/20 2054  LIPASE 304* 80* 122* 386*   No results for input(s): AMMONIA in the last 168 hours. Coagulation Profile: Recent Labs  Lab 11/28/20 0351  INR 0.9   Cardiac Enzymes: No results for input(s): CKTOTAL, CKMB, CKMBINDEX, TROPONINI in the last 168 hours. BNP (last 3 results) No results for input(s): PROBNP in the last 8760 hours. HbA1C: No results for input(s): HGBA1C in the last 72 hours. CBG: Recent Labs  Lab 11/27/20 2026 11/27/20 2315 11/28/20 0359 11/28/20 0722 11/28/20 1133  GLUCAP 129* 113* 139* 103* 102*   Lipid Profile: No results for input(s): CHOL, HDL, LDLCALC, TRIG, CHOLHDL, LDLDIRECT in the last 72  hours. Thyroid Function Tests: No results for input(s): TSH, T4TOTAL, FREET4, T3FREE, THYROIDAB in the last 72 hours. Anemia Panel: No results for input(s): VITAMINB12, FOLATE, FERRITIN, TIBC, IRON, RETICCTPCT in the last 72 hours. Urine analysis:    Component Value Date/Time   COLORURINE AMBER (A) 11/25/2020 0723   APPEARANCEUR CLEAR 11/25/2020 0723   LABSPEC 1.006 11/25/2020 0723   PHURINE 7.0 11/25/2020 0723   GLUCOSEU 50 (A) 11/25/2020 0723   HGBUR SMALL (A) 11/25/2020 0723   BILIRUBINUR NEGATIVE 11/25/2020 0723   KETONESUR NEGATIVE 11/25/2020 0723   PROTEINUR 30 (A) 11/25/2020 0723   UROBILINOGEN 1.0 05/25/2015 0318   NITRITE NEGATIVE 11/25/2020 0723   LEUKOCYTESUR NEGATIVE 11/25/2020 0723   Sepsis Labs: Recent Results (from the past 240 hour(s))  Resp Panel by RT-PCR (Flu A&B, Covid) Nasopharyngeal Swab     Status: None   Collection Time: 11/25/20  8:51 AM   Specimen:  Nasopharyngeal Swab; Nasopharyngeal(NP) swabs in vial transport medium  Result Value Ref Range Status   SARS Coronavirus 2 by RT PCR NEGATIVE NEGATIVE Final    Comment: (NOTE) SARS-CoV-2 target nucleic acids are NOT DETECTED.  The SARS-CoV-2 RNA is generally detectable in upper respiratory specimens during the acute phase of infection. The lowest concentration of SARS-CoV-2 viral copies this assay can detect is 138 copies/mL. A negative result does not preclude SARS-Cov-2 infection and should not be used as the sole basis for treatment or other patient management decisions. A negative result may occur with  improper specimen collection/handling, submission of specimen other than nasopharyngeal swab, presence of viral mutation(s) within the areas targeted by this assay, and inadequate number of viral copies(<138 copies/mL). A negative result must be combined with clinical observations, patient history, and epidemiological information. The expected result is Negative.  Fact Sheet for Patients:  EntrepreneurPulse.com.au  Fact Sheet for Healthcare Providers:  IncredibleEmployment.be  This test is no t yet approved or cleared by the [place] FDA and  has been authorized for detection and/or diagnosis of SARS-CoV-2 by FDA under an Emergency Use Authorization (EUA). This EUA will remain  in effect (meaning this test can be used) for the duration of the COVID-19 declaration under Section 564(b)(1) of the Act, 21 U.S.C.section 360bbb-3(b)(1), unless the authorization is terminated  or revoked sooner.       Influenza A by PCR NEGATIVE NEGATIVE Final   Influenza B by PCR NEGATIVE NEGATIVE Final    Comment: (NOTE) The Xpert Xpress SARS-CoV-2/FLU/RSV plus assay is intended as an aid in the diagnosis of influenza from Nasopharyngeal swab specimens and should not be used as a sole basis for treatment. Nasal washings and aspirates are unacceptable for  Xpert Xpress SARS-CoV-2/FLU/RSV testing.  Fact Sheet for Patients: EntrepreneurPulse.com.au  Fact Sheet for Healthcare Providers: IncredibleEmployment.be  This test is not yet approved or cleared by the [place] FDA and has been authorized for detection and/or diagnosis of SARS-CoV-2 by FDA under an Emergency Use Authorization (EUA). This EUA will remain in effect (meaning this test can be used) for the duration of the COVID-19 declaration under Section 564(b)(1) of the Act, 21 U.S.C. section 360bbb-3(b)(1), unless the authorization is terminated or revoked.  Performed at [facility], [place] [address] [place], [place] [address]      Radiological Exams on Admission: No results found.    Assessment/Plan Pancreatitis with obstructive jaundice secondary to pancreatic mass: Acute.  Patient presents back to the hospital after recently being [place].  MRCP from 3/15 was significant  for double duct sign with a masslike area of the head of the pancreas suspicious for pancreatic neoplasm with pancreatitis pancreatic duct dilation, and gallbladder wall thickening in the neck possibly but chronic cholecystitis related with biliary obstruction.  CA 19 9 -Admit to medical telemetry bed -Normal saline IV fluids at 150 mL/h -IV fentanyl as needed for pain -Zofran IV as needed -Appreciate GI consultative services, we will follow-up for further recommendations  Transaminitis and hyperbilirubinemia: Acute.  Secondary to above.  Labs significant for alkaline phosphatase 373, AST 235, ALT 246, and total bilirubin of 7.2. -Continue to monitor  Pregnancy in first trimester: Per review of records she was [redacted] weeks pregnant on 3/14.  At this time pregnancy is previable and therefore no fetal intervention indicated and would not need daily heart tones.  Full intervention was recommended for the primary issue as recommended by GI.   If needing cholecystectomy recommended planning for the second trimester.  Hydralazine and labetalol were recommended as needed for blood pressure management and and it was agreed that the Zofran was safe for pregnancy. -Consider need of reconsulting OB/GYN as needed  Normocytic anemia: Stable.  Hemoglobin 11.9 which appears near patient's baseline. -Continue to monitor  History of PE: Patient not on anticoagulation.  Tobacco abuse: Patient reports smoking half pack cigarettes per day on average. -Nicotine patch offered  DVT prophylaxis: Lovenox Code Status: Full Family Communication: No family requested to be updated at this time Disposition Plan: Likely discharge home once work-up complete Consults called: GI Admission status: Inpatient, require more than 2 midnight stay  Norval Morton MD [facility]   If 7PM-7AM, please contact night-coverage   11/30/2020, 9:13 AM

## 2020-11-30 NOTE — Progress Notes (Signed)
Patient was readmitted through the ED today. We are hoping to schedule her EUS +/- ERCP with you on Monday afternoon.

## 2020-11-30 NOTE — ED Provider Notes (Signed)
[facility] EMERGENCY DEPARTMENT Provider Note   CSN: [id number] Arrival date & time: 11/29/20  1949    History Chief Complaint  Patient presents with  . Abdominal Pain    [redacted] weeks pregnant    Cynthia [name] is a 40 y.o. female G9P8 approximately [redacted] weeks pregnant,  with history significant for PE who presents for evaluation of abdominal pain.  Admitted 5 days ago for questionable choledocholithiasis.  Was seen by GI previously inpatient. Had MRCP. Dilated ducts were noted.  There is concern raised about a mass in the pancreatic head.  Plan was to do an EUS.  Patient had concerned given pregnancy and decided to leave AGAINST MEDICAL ADVICE.  She returns today with persistent pain and increased jaundice of her skin.  Has had some nausea as well as some intermittent loose stools.  No emesis.  No melena or bright blood per rectum in her stool.  Patient states she is back to receive the procedure.  She understands her risks and benefits.  She would like to be admitted  Per chart review, Dr. Meridee Score, gastroenterologist with the [facility] placed a note early this morning.  It appears patient's CA 19-9 is markedly elevated at 481.  Appears to have need an EUS and ERCP. unfortunately no access to these procedures outpatient.  They had recommended she return to the hospital for procedures given known pregnancy and timing.  She rates her current pain a 4/10.  Pain worsens with food intake.  No fever, chills, chest pain, shortness of breath, dysuria, hematuria, pelvic pain, lower abdominal pain, vaginal discharge.    Last PO intake 11P yesterday   HPI     Past Medical History:  Diagnosis Date  . History of pulmonary embolism   . Normal pregnancy in multigravida in third trimester 07/18/2018  . Tobacco abuse     Patient Active Problem List   Diagnosis Date Noted  . Obstructive jaundice 11/30/2020  . Abnormal MRI of the abdomen   . Biliary obstruction   . Acute  pancreatitis 11/26/2020  . Choledocholithiasis 11/25/2020  . Cholelithiasis 11/25/2020  . Pancreatitis 11/25/2020  . Diarrhea 11/25/2020  . First trimester pregnancy   . Status post repeat low transverse cesarean section 08/03/2018  . Pregnancy 07/18/2018  . Sinus bradycardia 05/26/2015  . PE (pulmonary embolism) 05/25/2015  . Cigarette smoker 05/25/2015  . Pulmonary emboli (HCC)   . H/O cesarean section 02/27/2015    Past Surgical History:  Procedure Laterality Date  . CESAREAN SECTION    . CESAREAN SECTION N/A 02/27/2015   Procedure: CESAREAN SECTION;  Surgeon: Tracey Harries, MD;  Location: [facility] ORS;  Service: Obstetrics;  Laterality: N/A;  . CESAREAN SECTION N/A 08/03/2018   Procedure: REPEAT CESAREAN SECTION;  Surgeon: Sherian Rein, MD;  Location: [facility] BIRTHING SUIT[facility];  Service: Obstetrics;  Laterality: N/A;  [name], RNFA     OB History    Gravida  9   Para  8   Term  8   Preterm      AB      Living  8     SAB      IAB      Ectopic      Multiple  0   Live Births  8           Family History  Problem Relation Age of Onset  . Hypertension Mother   . Hypertension Father     Social History   Tobacco Use  .  Smoking status: Current Every Day Smoker    Packs/day: 0.50    Years: 13.00    Pack years: 6.50    Types: Cigarettes  . Smokeless tobacco: Never Used  Vaping Use  . Vaping Use: Never used  Substance Use Topics  . Alcohol use: No    Alcohol/week: 0.0 standard drinks  . Drug use: No    Home Medications Prior to Admission medications   Medication Sig Start Date End Date Taking? Authorizing Provider  naproxen (NAPROSYN) 500 MG tablet Take 1 tablet (500 mg total) by mouth 2 (two) times daily as needed for moderate pain. 09/21/20  Yes Petrucelli, Samantha R, PA-C  methocarbamol (ROBAXIN) 500 MG tablet Take 1 tablet (500 mg total) by mouth every 8 (eight) hours as needed for muscle spasms. Patient not taking: No sig reported 09/21/20    Petrucelli, Samantha R, PA-C  enoxaparin (LOVENOX) 40 MG/0.4ML injection Inject 40 mg into the skin daily.  09/21/20  [provider]    Allergies    Patient has no known allergies.  Review of Systems   Review of Systems  Constitutional: Negative.   HENT: Negative.   Respiratory: Negative.   Cardiovascular: Negative.   Gastrointestinal: Positive for abdominal pain, diarrhea and nausea. Negative for abdominal distention, anal bleeding, blood in stool, constipation, rectal pain and vomiting.  Genitourinary: Negative.   Musculoskeletal: Negative.   Skin: Negative.   Neurological: Negative.   All other systems reviewed and are negative.   Physical Exam Updated Vital Signs BP (!) 107/59   Pulse (!) 55   Temp 98 F (36.7 C) (Oral)   Resp 18   Ht $R'5\' 5"'jC$  (1.651 m)   Wt 76 kg   LMP 01/13/2020   SpO2 100%   BMI 27.88 kg/m   Physical Exam Vitals and nursing note reviewed.  Constitutional:      General: She is not in acute distress.    Appearance: She is well-developed. She is not ill-appearing, toxic-appearing or diaphoretic.  HENT:     Head: Normocephalic and atraumatic.     Mouth/Throat:     Mouth: Mucous membranes are moist.  Eyes:     General: Scleral icterus present.     Comments: Scleral icterus  Cardiovascular:     Rate and Rhythm: Normal rate.     Heart sounds: Normal heart sounds.  Pulmonary:     Effort: Pulmonary effort is normal. No respiratory distress.     Breath sounds: Normal breath sounds.  Abdominal:     General: Bowel sounds are normal. There is no distension.     Palpations: Abdomen is soft.     Tenderness: There is abdominal tenderness in the right upper quadrant and epigastric area. There is no guarding or rebound. Negative signs include Murphy's sign and McBurney's sign.     Hernia: No hernia is present.     Comments: Tenderness to right upper quadrant, epigastric region. No rebound or guarding  Musculoskeletal:        General: Normal range  of motion.     Cervical back: Normal range of motion.  Skin:    General: Skin is warm and dry.     Coloration: Skin is jaundiced.  Neurological:     General: No focal deficit present.     Mental Status: She is alert and oriented to person, place, and time.     ED Results / Procedures / Treatments   Labs (all labs ordered are listed, but only abnormal results are displayed)  Labs Reviewed  LIPASE, BLOOD - Abnormal; Notable for the following components:      Result Value   Lipase 386 (*)    All other components within normal limits  COMPREHENSIVE METABOLIC PANEL - Abnormal; Notable for the following components:   Sodium 132 (*)    BUN <5 (*)    Albumin 3.1 (*)    AST 235 (*)    ALT 246 (*)    Alkaline Phosphatase 373 (*)    Total Bilirubin 7.2 (*)    All other components within normal limits  CBC - Abnormal; Notable for the following components:   Hemoglobin 11.9 (*)    HCT 33.9 (*)    All other components within normal limits  I-STAT BETA HCG BLOOD, ED ([facility], [facility], [facility] ONLY) - Abnormal; Notable for the following components:   I-stat hCG, quantitative >2,000.0 (*)    All other components within normal limits  RESP PANEL BY RT-PCR (FLU A&B, COVID) ARPGX2  DIFFERENTIAL  URINALYSIS, ROUTINE W REFLEX MICROSCOPIC    EKG None  Radiology No results found.  Procedures Procedures   Medications Ordered in ED Medications  0.9 %  sodium chloride infusion (has no administration in time range)  fentaNYL (SUBLIMAZE) injection 25 mcg (has no administration in time range)  sodium chloride 0.9 % bolus 1,000 mL (1,000 mLs Intravenous New Bag/Given 11/30/20 0830)  ondansetron (ZOFRAN) injection 4 mg (4 mg Intravenous Given 11/30/20 0830)  fentaNYL (SUBLIMAZE) injection 50 mcg (50 mcg Intravenous Given 11/30/20 0831)    ED Course  I have reviewed the triage vital signs and the nursing notes.  Pertinent labs & imaging results that were available during my care of the patient were reviewed  by me and considered in my medical decision making (see chart for details).  40 year old G9P8 approximately [redacted] weeks pregnant here for evaluation of right upper quadrant abdominal pain.  Afebrile, nonseptic, non-ill-appearing unfortunately left against medical advice 2 days ago. Was concern for possibly obstructing mass in her pancreatic head.  Patient returns today due to persistent symptoms and worsening jaundice.  Per [facility] MD note this morning who recommended likely inpatient procedures given unable to have access to this outpatient for a few weeks.  Patient without any pelvic pain, vaginal discharge, low suspicion for acute pregnancy complication.  Ultrasound from 4 days ago showed single IUP.  Patient has marked scleral icterus.  Tenderness right upper quadrant epigastric region however no rebound or guarding. Patient is now amenable for inpatient workup.  Labs imaging personally reviewed and interpreted:  CBC without leukocytosis Lipase 386 up from 122 CMP with sodium 132, AST 235, ALT 246. Alk Phos 373, T bili 7.2 up from 5.4  Will plan to consult GI, Hospitalist for admission.  Patient afebrile without leukocytosis.  Low suspicion for acute cholecystitis, cholangitis at this time.  Discussed admission with patient and stressed importance of further testing due to elevated CA19-9. She is agreeable for admission and procedures at this time.  Per Ob note from a few days ago.  "Pregnancy is previable, no fetal intervention indicated and recommend full intervention for primary issue as per GI. Also recommend that if she needs cholecystectomy, recommend planning for second trimester (14-28 weeks) but may be done at any point if necessary."   CONSULT with Azucena Freed with [place] who recommends Hospitalist admit, will follow does not need Abx at this time.  CONSULT with Dr Tamala Julian with [place] who agrees to evaluate patient for admission  The patient appears reasonably stabilized for  admission considering the current resources, flow, and capabilities available in the ED at this time, and I doubt any other [facility] requiring further screening and/or treatment in the ED prior to admission.      MDM Rules/Calculators/A&P                           Final Clinical Impression(s) / ED Diagnoses Final diagnoses:  Acute pancreatitis, unspecified complication status, unspecified pancreatitis type  Obstructive jaundice  Pancreatic mass  [redacted] weeks gestation of pregnancy  Nausea    Rx / DC Orders ED Discharge Orders    None       Henderly, Britni A, PA-C 11/30/20 0931    Lajean Saver, MD 12/01/20 1223

## 2020-11-30 NOTE — Progress Notes (Addendum)
I learned that the patient left AGAINST MEDICAL ADVICE on 3/17 p.m0.  I am placing this notation in the chart as documentation upon review of the chart since labs returned. Her IgG4 was normal. Her CA 19-9 is markedly elevated at greater than 450.  This is unfortunate about her leaving the hospital, due to her clinical scenario concerning for biliary and pancreatic obstruction with imaging showing a possible mass in HOP.  The elevated CA19-9 is very concerning (though we know this can be elevated at times just from obstruction, this seems much higher than would be expected).  Having left AMA, it is now up to the patient to see if she wants to follow up for further evaluation/treatment.  Hopefully she is willing to have some sort of GI follow-up but it is now up to the patient to make that decision.  She was met by Dr. Tarri Glenn and PA Fabio Asa during the hospitalization and I will forward to them so that they are aware also of the labs; but understanding that it is up to the patient now if she wants to return for follow up or not.  I think that plan for EUS/ERCP is still reasonable, especially if LFTs remain abnormal or she is still having issues.     Unfortunately, I have no access to outpatient EUS/ERCP for the next few weeks so if she is still having issues then at some point she will likely need to return to the hospital for procedures especially in the setting of her known pregnancy and timing (currently nonviable fetus).  I think there is a high chance she will return to the hospital in the interim though.   Justice Britain, MD [facility] Office # [phone]

## 2020-12-01 DIAGNOSIS — K8689 Other specified diseases of pancreas: Secondary | ICD-10-CM | POA: Diagnosis not present

## 2020-12-01 DIAGNOSIS — K858 Other acute pancreatitis without necrosis or infection: Secondary | ICD-10-CM

## 2020-12-01 DIAGNOSIS — K851 Biliary acute pancreatitis without necrosis or infection: Secondary | ICD-10-CM

## 2020-12-01 DIAGNOSIS — Z3491 Encounter for supervision of normal pregnancy, unspecified, first trimester: Secondary | ICD-10-CM

## 2020-12-01 DIAGNOSIS — K831 Obstruction of bile duct: Secondary | ICD-10-CM | POA: Diagnosis not present

## 2020-12-01 DIAGNOSIS — Z3A12 12 weeks gestation of pregnancy: Secondary | ICD-10-CM | POA: Diagnosis not present

## 2020-12-01 LAB — COMPREHENSIVE METABOLIC PANEL
ALT: 221 U/L — ABNORMAL HIGH (ref 0–44)
AST: 227 U/L — ABNORMAL HIGH (ref 15–41)
Albumin: 2.5 g/dL — ABNORMAL LOW (ref 3.5–5.0)
Alkaline Phosphatase: 353 U/L — ABNORMAL HIGH (ref 38–126)
Anion gap: 7 (ref 5–15)
BUN: <5 mg/dL — ABNORMAL LOW (ref 6–20)
CO2: 22 mmol/L (ref 22–32)
Calcium: 8.7 mg/dL — ABNORMAL LOW (ref 8.9–10.3)
Chloride: 107 mmol/L (ref 98–111)
Creatinine, Ser: 0.49 mg/dL (ref 0.44–1.00)
GFR, Estimated: >60 mL/min (ref >60)
Glucose, Bld: 112 mg/dL — ABNORMAL HIGH (ref 70–99)
Potassium: 3.5 mmol/L (ref 3.5–5.1)
Sodium: 136 mmol/L (ref 135–145)
Total Bilirubin: 6.2 mg/dL — ABNORMAL HIGH (ref 0.3–1.2)
Total Protein: 5.3 g/dL — ABNORMAL LOW (ref 6.5–8.1)

## 2020-12-01 MED ORDER — DIPHENHYDRAMINE HCL 50 MG/ML IJ SOLN
25.0000 mg | Freq: Four times a day (QID) | INTRAMUSCULAR | Status: DC | PRN
  Administered 2020-12-01 – 2020-12-05 (×5): 25 mg via INTRAVENOUS
  Filled 2020-12-01 (×5): qty 1

## 2020-12-01 NOTE — Progress Notes (Signed)
PROGRESS NOTE    Cynthia [name]  JKK:[id number] DOB: 20-Sep-1980 DOA: 11/29/2020 PCP: [facility]    Brief Narrative:  40 year old female with history of smoking, remote history of pulmonary embolism, currently [redacted] weeks pregnant who is having abdominal pain for about 7 to 10 days now presents back to the emergency room with ongoing abdominal pain.  Patient started having pain 10 days ago, was admitted to the hospital on 3/14 with pancreatitis, pancreatic head tumor and obstructive jaundice.  She was also found to have [redacted] weeks pregnant.  Patient was a scheduled for ERCP, however she left AMA.  She thought about the options of not treating, now decided to get treatment so came back to the ER.  Followed by GI.   Assessment & Plan:   Principal Problem:   Pancreatitis Active Problems:   Cigarette smoker   First trimester pregnancy   Obstructive jaundice   Pancreatic mass   History of pulmonary embolus (PE)   Transaminitis  Obstructive jaundice/abdominal pain/pancreatic head mass with acute pancreatitis: Currently pain-free.  Tolerating regular diet.  Continues to have obstructive jaundice.  MRCP consistent with pancreatic head mass.  Elevated CA 19-9. Maintenance IV fluids.  Adequate pain medications. Scheduled for ERCP tomorrow for biliary stenting, biopsy.  First trimester pregnancy: Pregnant with ninth child, she has 52 living children at home 69 -34-year-old.  12-week pregnant at this time.  Previable. OB/GYN consultation 3/14 by Dr. Vivien Rota, MD recommended no indication for fetal monitoring, continue to treat medical conditions and use safe medications. Patient is aware about possible risks to the first trimester pregnancy and fetus.  History of pulmonary embolism: Developed bilateral PE on 05/2015 while on OCP , postpartum 3 months. Hypercoagulable work-up including anticardiolipin antibody, lupus anticoagulant, beta-2 glycoprotein, antithrombin activities  normal.  Protein C, protein S normal functional. She was on anticoagulation for 6 months then discontinued.  Currently denies any active bleeding issues.  Smoker: On nicotine patch.   DVT prophylaxis: heparin injection 5,000 Units Start: 11/30/20 1445   Code Status: Full code Family Communication: None.  Patient is talking to her family. Disposition Plan: Status is: Inpatient  Remains inpatient appropriate because:Ongoing diagnostic testing needed not appropriate for outpatient work up   Dispo: The patient is from: Home              Anticipated d/c is to: Home              Patient currently is not medically stable to d/c.   Difficult to place patient No         Consultants:   Gastroenterology  Procedures:   None  Antimicrobials:   None   Subjective: Patient seen and examined.  No overnight events.  She was without any symptoms.  Denies any pain.  Denies any nausea vomiting.  Patient is well aware about ERCP procedure tomorrow.  No family at the bedside.  Objective: Vitals:   11/30/20 1715 11/30/20 1804 11/30/20 2146 12/01/20 0914  BP: 111/69 104/63 107/69 109/61  Pulse: 70 62 62 64  Resp: 19 18  18   Temp:  98.7 F (37.1 C) 98.2 F (36.8 C) 97.9 F (36.6 C)  TempSrc:  Oral Oral   SpO2: 100% 100% 100% 97%  Weight:      Height:        Intake/Output Summary (Last 24 hours) at 12/01/2020 1106 Last data filed at 12/01/2020 0900 Gross per 24 hour  Intake 1836.05 ml  Output -  Net  1836.05 ml   Filed Weights   11/29/20 2014  Weight: 76 kg    Examination:  General exam: Appears calm and comfortable  Respiratory system: Clear to auscultation. Respiratory effort normal. Cardiovascular system: S1 & S2 heard, RRR. No JVD, murmurs, rubs, gallops or clicks. No pedal edema. Gastrointestinal system: Abdomen is nondistended, soft and nontender. No organomegaly or masses felt. Normal bowel sounds heard. Central nervous system: Alert and oriented. No focal  neurological deficits. Extremities: Symmetric 5 x 5 power. Skin: No rashes, lesions or ulcers Psychiatry: Judgement and insight appear normal. Mood & affect appropriate.     Data Reviewed: I have personally reviewed following labs and imaging studies  CBC: Recent Labs  Lab 11/25/20 0723 11/26/20 0342 11/27/20 0354 11/28/20 0351 11/29/20 2054  WBC 6.5 6.3 6.0 5.4 6.9  NEUTROABS 4.6  --   --   --  4.9  HGB 11.1* 11.1* 11.7* 11.1* 11.9*  HCT 32.5* 32.8* 34.1* 32.0* 33.9*  MCV 84.4 85.9 84.8 83.8 83.9  PLT 277 265 295 274 244   Basic Metabolic Panel: Recent Labs  Lab 11/26/20 0342 11/27/20 0354 11/28/20 0351 11/29/20 2054 12/01/20 0300  NA 136 138 138 132* 136  K 3.3* 3.8 3.8 4.1 3.5  CL 106 107 108 100 107  CO2 19* 23 19* 23 22  GLUCOSE 64* 95 129* 95 112*  BUN 7 <5* 5* <5* <5*  CREATININE 0.38* 0.45 0.39* 0.58 0.49  CALCIUM 8.7* 9.3 9.1 9.1 8.7*  MG 1.6*  --   --   --   --    GFR: Estimated Creatinine Clearance: 98.2 mL/min (by C-G formula based on SCr of 0.49 mg/dL). Liver Function Tests: Recent Labs  Lab 11/26/20 0342 11/27/20 0354 11/28/20 0351 11/29/20 2054 12/01/20 0300  AST 126* 193* 259* 235* 227*  ALT 180* 201* 223* 246* 221*  ALKPHOS 297* 365* 327* 373* 353*  BILITOT 5.4* 6.0* 5.4* 7.2* 6.2*  PROT 6.1* 6.8 6.2* 6.5 5.3*  ALBUMIN 3.2* 3.4* 3.0* 3.1* 2.5*   Recent Labs  Lab 11/25/20 0723 11/26/20 0342 11/27/20 0354 11/29/20 2054  LIPASE 304* 80* 122* 386*   No results for input(s): AMMONIA in the last 168 hours. Coagulation Profile: Recent Labs  Lab 11/28/20 0351  INR 0.9   Cardiac Enzymes: No results for input(s): CKTOTAL, CKMB, CKMBINDEX, TROPONINI in the last 168 hours. BNP (last 3 results) No results for input(s): PROBNP in the last 8760 hours. HbA1C: No results for input(s): HGBA1C in the last 72 hours. CBG: Recent Labs  Lab 11/27/20 2026 11/27/20 2315 11/28/20 0359 11/28/20 0722 11/28/20 1133  GLUCAP 129* 113* 139*  103* 102*   Lipid Profile: No results for input(s): CHOL, HDL, LDLCALC, TRIG, CHOLHDL, LDLDIRECT in the last 72 hours. Thyroid Function Tests: No results for input(s): TSH, T4TOTAL, FREET4, T3FREE, THYROIDAB in the last 72 hours. Anemia Panel: No results for input(s): VITAMINB12, FOLATE, FERRITIN, TIBC, IRON, RETICCTPCT in the last 72 hours. Sepsis Labs: No results for input(s): PROCALCITON, LATICACIDVEN in the last 168 hours.  Recent Results (from the past 240 hour(s))  Resp Panel by RT-PCR (Flu A&B, Covid) Nasopharyngeal Swab     Status: None   Collection Time: 11/25/20  8:51 AM   Specimen: Nasopharyngeal Swab; Nasopharyngeal(NP) swabs in vial transport medium  Result Value Ref Range Status   SARS Coronavirus 2 by RT PCR NEGATIVE NEGATIVE Final    Comment: (NOTE) SARS-CoV-2 target nucleic acids are NOT DETECTED.  The SARS-CoV-2 RNA is generally detectable in  upper respiratory specimens during the acute phase of infection. The lowest concentration of SARS-CoV-2 viral copies this assay can detect is 138 copies/mL. A negative result does not preclude SARS-Cov-2 infection and should not be used as the sole basis for treatment or other patient management decisions. A negative result may occur with  improper specimen collection/handling, submission of specimen other than nasopharyngeal swab, presence of viral mutation(s) within the areas targeted by this assay, and inadequate number of viral copies(<138 copies/mL). A negative result must be combined with clinical observations, patient history, and epidemiological information. The expected result is Negative.  Fact Sheet for Patients:  EntrepreneurPulse.com.au  Fact Sheet for Healthcare Providers:  IncredibleEmployment.be  This test is no t yet approved or cleared by the [place] FDA and  has been authorized for detection and/or diagnosis of SARS-CoV-2 by FDA under an Emergency Use  Authorization (EUA). This EUA will remain  in effect (meaning this test can be used) for the duration of the COVID-19 declaration under Section 564(b)(1) of the Act, 21 U.S.C.section 360bbb-3(b)(1), unless the authorization is terminated  or revoked sooner.       Influenza A by PCR NEGATIVE NEGATIVE Final   Influenza B by PCR NEGATIVE NEGATIVE Final    Comment: (NOTE) The Xpert Xpress SARS-CoV-2/FLU/RSV plus assay is intended as an aid in the diagnosis of influenza from Nasopharyngeal swab specimens and should not be used as a sole basis for treatment. Nasal washings and aspirates are unacceptable for Xpert Xpress SARS-CoV-2/FLU/RSV testing.  Fact Sheet for Patients: EntrepreneurPulse.com.au  Fact Sheet for Healthcare Providers: IncredibleEmployment.be  This test is not yet approved or cleared by the [place] FDA and has been authorized for detection and/or diagnosis of SARS-CoV-2 by FDA under an Emergency Use Authorization (EUA). This EUA will remain in effect (meaning this test can be used) for the duration of the COVID-19 declaration under Section 564(b)(1) of the Act, 21 U.S.C. section 360bbb-3(b)(1), unless the authorization is terminated or revoked.  Performed at [facility], [place] [address], [place], [place] [address]   Resp Panel by RT-PCR (Flu A&B, Covid) Nasopharyngeal Swab     Status: None   Collection Time: 11/29/20  8:30 PM   Specimen: Nasopharyngeal Swab; Nasopharyngeal(NP) swabs in vial transport medium  Result Value Ref Range Status   SARS Coronavirus 2 by RT PCR NEGATIVE NEGATIVE Final    Comment: (NOTE) SARS-CoV-2 target nucleic acids are NOT DETECTED.  The SARS-CoV-2 RNA is generally detectable in upper respiratory specimens during the acute phase of infection. The lowest concentration of SARS-CoV-2 viral copies this assay can detect is 138 copies/mL. A negative result does not preclude  SARS-Cov-2 infection and should not be used as the sole basis for treatment or other patient management decisions. A negative result may occur with  improper specimen collection/handling, submission of specimen other than nasopharyngeal swab, presence of viral mutation(s) within the areas targeted by this assay, and inadequate number of viral copies(<138 copies/mL). A negative result must be combined with clinical observations, patient history, and epidemiological information. The expected result is Negative.  Fact Sheet for Patients:  EntrepreneurPulse.com.au  Fact Sheet for Healthcare Providers:  IncredibleEmployment.be  This test is no t yet approved or cleared by the [place] FDA and  has been authorized for detection and/or diagnosis of SARS-CoV-2 by FDA under an Emergency Use Authorization (EUA). This EUA will remain  in effect (meaning this test can be used) for the duration of the COVID-19 declaration under Section 564(b)(1)  of the Act, 21 U.S.C.section 360bbb-3(b)(1), unless the authorization is terminated  or revoked sooner.       Influenza A by PCR NEGATIVE NEGATIVE Final   Influenza B by PCR NEGATIVE NEGATIVE Final    Comment: (NOTE) The Xpert Xpress SARS-CoV-2/FLU/RSV plus assay is intended as an aid in the diagnosis of influenza from Nasopharyngeal swab specimens and should not be used as a sole basis for treatment. Nasal washings and aspirates are unacceptable for Xpert Xpress SARS-CoV-2/FLU/RSV testing.  Fact Sheet for Patients: EntrepreneurPulse.com.au  Fact Sheet for Healthcare Providers: IncredibleEmployment.be  This test is not yet approved or cleared by the [place] FDA and has been authorized for detection and/or diagnosis of SARS-CoV-2 by FDA under an Emergency Use Authorization (EUA). This EUA will remain in effect (meaning this test can be used) for the duration of  the COVID-19 declaration under Section 564(b)(1) of the Act, 21 U.S.C. section 360bbb-3(b)(1), unless the authorization is terminated or revoked.  Performed at [facility], [place] [address], [place], [place] [address]          Radiology Studies: No results found.      Scheduled Meds: . heparin injection (subcutaneous)  5,000 Units Subcutaneous Q8H  . nicotine  14 mg Transdermal Daily  . sodium chloride flush  3 mL Intravenous Q12H   Continuous Infusions: . sodium chloride 150 mL/hr at 12/01/20 0935     LOS: 1 day    Time spent: 34 minutes    Barb Merino, MD [facility] Pager [phone]

## 2020-12-01 NOTE — Progress Notes (Addendum)
          Daily Rounding Note  12/01/2020, 11:44 AM  LOS: 1 day   SUBJECTIVE:   Chief complaint: Elevated LFTs.  Mass in pancreas, dilated PD and CBD.  Patient feeling okay.  Having some upper abdominal pain controlled with Antonello.  No nausea or vomiting.  Says her appetite is good.  OBJECTIVE:         Vital signs in last 24 hours:    Temp:  [97.9 F (36.6 C)-98.7 F (37.1 C)] 97.9 F (36.6 C) (03/20 0914) Pulse Rate:  [53-72] 64 (03/20 0914) Resp:  [12-21] 18 (03/20 0914) BP: (101-120)/(58-91) 109/61 (03/20 0914) SpO2:  [97 %-100 %] 97 % (03/20 0914)   Filed Weights   11/29/20 2014  Weight: 76 kg   General: Does not look ill but jaundiced. Heart: RRR Chest: Clear bilaterally without labored breathing Abdomen: Soft, nondistended, active bowel sounds.  Slight upper abdominal tenderness RUQ. Extremities: No CCE. Neuro/Psych: Oriented x3.  Sleepy but easily aroused.  Intake/Output from previous day: 03/19 0701 - 03/20 0700 In: 1596.1 [P.O.:240; I.V.:1356.1] Out: -   Intake/Output this shift: Total I/O In: 240 [P.O.:240] Out: -   Lab Results: Recent Labs    11/29/20 2054  WBC 6.9  HGB 11.9*  HCT 33.9*  PLT 289   BMET Recent Labs    11/29/20 2054 12/01/20 0300  NA 132* 136  K 4.1 3.5  CL 100 107  CO2 23 22  GLUCOSE 95 112*  BUN <5* <5*  CREATININE 0.58 0.49  CALCIUM 9.1 8.7*   LFT Recent Labs    11/29/20 2054 12/01/20 0300  PROT 6.5 5.3*  ALBUMIN 3.1* 2.5*  AST 235* 227*  ALT 246* 221*  ALKPHOS 373* 353*  BILITOT 7.2* 6.2*   PT/INR No results for input(s): LABPROT, INR in the last 72 hours. Hepatitis Panel No results for input(s): HEPBSAG, HCVAB, HEPAIGM, HEPBIGM in the last 72 hours.  Studies/Results: No results found.   Scheduled Meds: . heparin injection (subcutaneous)  5,000 Units Subcutaneous Q8H  . nicotine  14 mg Transdermal Daily  . sodium chloride flush  3 mL Intravenous  Q12H   Continuous Infusions: . sodium chloride 150 mL/hr at 12/01/20 0935   PRN Meds:.albuterol, fentaNYL (SUBLIMAZE) injection, hydrALAZINE, ondansetron **OR** ondansetron (ZOFRAN) IV   ASSESMENT:   *   Obstructive jaundice.  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  CA 19-9 481. T bili, LFTs improved overnight.  *    Pregnant. week 12-13.      PLAN   *   Endoscopic ultrasound plus minus ERCP tomorrow with Dr. Rush Landmark. *   N.p.o. after clear liquid breakfast tomorrow.  Solid food for the rest of today. *   Stop the subcu heparin after tonight's injection.    Azucena Freed  12/01/2020, 11:44 AM Phone [phone]

## 2020-12-01 NOTE — Progress Notes (Signed)
NEW ADMISSION NOTE New Admission Note:   Arrival Method:  E.D stretcher bed Mental Orientation: Alert and oriented x4 Telemetry:Box 1 NSR Assessment: Completed Skin: Intact ,assessed with Durward Mallard R.N. IV: G#20 Rt. A/C with normal saline @150  cc/hr. Pain:Denies. Tubes:None Safety Measures: Safety Fall Prevention Plan has been given, discussed and signed Admission: Completed [facility] Orientation: Patient has been orientated to the room, unit and staff.  Family:  Orders have been reviewed and implemented. Will continue to monitor the patient. Call light has been placed within reach and bed alarm has been activated.   [place], Zenon Mayo, RN

## 2020-12-02 ENCOUNTER — Inpatient Hospital Stay (HOSPITAL_COMMUNITY): Payer: Medicaid Other

## 2020-12-02 ENCOUNTER — Inpatient Hospital Stay (HOSPITAL_COMMUNITY): Payer: Medicaid Other | Admitting: Anesthesiology

## 2020-12-02 ENCOUNTER — Encounter (HOSPITAL_COMMUNITY): Payer: Self-pay | Admitting: Internal Medicine

## 2020-12-02 ENCOUNTER — Encounter (HOSPITAL_COMMUNITY)
Admission: EM | Disposition: A | Discharge status: Home / Self Care | Payer: Self-pay | Source: Home / Self Care | Attending: Internal Medicine

## 2020-12-02 DIAGNOSIS — K859 Acute pancreatitis without necrosis or infection, unspecified: Secondary | ICD-10-CM

## 2020-12-02 DIAGNOSIS — O99611 Diseases of the digestive system complicating pregnancy, first trimester: Secondary | ICD-10-CM

## 2020-12-02 DIAGNOSIS — B9681 Helicobacter pylori [H. pylori] as the cause of diseases classified elsewhere: Secondary | ICD-10-CM

## 2020-12-02 DIAGNOSIS — D490 Neoplasm of unspecified behavior of digestive system: Secondary | ICD-10-CM

## 2020-12-02 HISTORY — PX: BIOPSY: SHX5522

## 2020-12-02 HISTORY — PX: FINE NEEDLE ASPIRATION: SHX5430

## 2020-12-02 HISTORY — PX: ESOPHAGOGASTRODUODENOSCOPY: SHX5428

## 2020-12-02 HISTORY — PX: ERCP: SHX5425

## 2020-12-02 HISTORY — PX: UPPER ESOPHAGEAL ENDOSCOPIC ULTRASOUND (EUS): SHX6562

## 2020-12-02 SURGERY — EGD (ESOPHAGOGASTRODUODENOSCOPY)
Anesthesia: General

## 2020-12-02 MED ORDER — LACTATED RINGERS IV SOLN: INTRAVENOUS | Status: AC

## 2020-12-02 MED ORDER — ROCURONIUM BROMIDE 100 MG/10ML IV SOLN
INTRAVENOUS | Status: DC | PRN
  Administered 2020-12-02 (×2): 50 mg via INTRAVENOUS

## 2020-12-02 MED ORDER — INDOMETHACIN 50 MG RE SUPP
RECTAL | Status: DC | PRN
  Administered 2020-12-02: 100 mg via RECTAL

## 2020-12-02 MED ORDER — SODIUM CHLORIDE 0.9 % IV SOLN
INTRAVENOUS | Status: AC
  Filled 2020-12-02: qty 8

## 2020-12-02 MED ORDER — LIDOCAINE HCL (CARDIAC) PF 100 MG/5ML IV SOSY
PREFILLED_SYRINGE | INTRAVENOUS | Status: DC | PRN
  Administered 2020-12-02: 60 mg via INTRATRACHEAL

## 2020-12-02 MED ORDER — SUGAMMADEX SODIUM 200 MG/2ML IV SOLN
INTRAVENOUS | Status: DC | PRN
  Administered 2020-12-02: 200 mg via INTRAVENOUS

## 2020-12-02 MED ORDER — GLUCAGON HCL RDNA (DIAGNOSTIC) 1 MG IJ SOLR
INTRAMUSCULAR | Status: AC
  Filled 2020-12-02: qty 1

## 2020-12-02 MED ORDER — SODIUM CHLORIDE 0.9 % IV SOLN
INTRAVENOUS | Status: DC | PRN
  Administered 2020-12-02: 3 g via INTRAVENOUS

## 2020-12-02 MED ORDER — GLUCAGON HCL RDNA (DIAGNOSTIC) 1 MG IJ SOLR
INTRAMUSCULAR | Status: DC | PRN
  Administered 2020-12-02: .25 mg via INTRAVENOUS

## 2020-12-02 MED ORDER — LACTATED RINGERS IV SOLN: INTRAVENOUS | Status: DC | PRN

## 2020-12-02 MED ORDER — SUCCINYLCHOLINE CHLORIDE 20 MG/ML IJ SOLN
INTRAMUSCULAR | Status: DC | PRN
  Administered 2020-12-02: 100 mg via INTRAVENOUS

## 2020-12-02 MED ORDER — ONDANSETRON HCL 4 MG/2ML IJ SOLN
INTRAMUSCULAR | Status: DC | PRN
  Administered 2020-12-02: 4 mg via INTRAVENOUS

## 2020-12-02 MED ORDER — MIDAZOLAM HCL 2 MG/2ML IJ SOLN
INTRAMUSCULAR | Status: AC
  Filled 2020-12-02: qty 2

## 2020-12-02 MED ORDER — FENTANYL CITRATE (PF) 250 MCG/5ML IJ SOLN
INTRAMUSCULAR | Status: DC | PRN
  Administered 2020-12-02 (×2): 25 ug via INTRAVENOUS
  Administered 2020-12-02: 100 ug via INTRAVENOUS
  Administered 2020-12-02: 25 ug via INTRAVENOUS

## 2020-12-02 MED ORDER — PROPOFOL 10 MG/ML IV BOLUS
INTRAVENOUS | Status: DC | PRN
  Administered 2020-12-02: 200 mg via INTRAVENOUS

## 2020-12-02 MED ORDER — CIPROFLOXACIN IN D5W 400 MG/200ML IV SOLN
INTRAVENOUS | Status: AC
  Filled 2020-12-02: qty 200

## 2020-12-02 MED ORDER — FENTANYL CITRATE (PF) 250 MCG/5ML IJ SOLN
INTRAMUSCULAR | Status: AC
  Filled 2020-12-02: qty 5

## 2020-12-02 MED ORDER — INDOMETHACIN 50 MG RE SUPP
RECTAL | Status: AC
  Filled 2020-12-02: qty 2

## 2020-12-02 MED ORDER — PHENYLEPHRINE 40 MCG/ML (10ML) SYRINGE FOR IV PUSH (FOR BLOOD PRESSURE SUPPORT)
PREFILLED_SYRINGE | INTRAVENOUS | Status: DC | PRN
  Administered 2020-12-02: 60 ug via INTRAVENOUS

## 2020-12-02 NOTE — Op Note (Signed)
[facility] Patient Name: Cynthia [name] Procedure Date : 12/02/2020 MRN: [id number] Attending MD: Justice Britain , MD Date of Birth: Aug 03, 1981 CSN: [id number] Age: 40 Admit Type: Inpatient Procedure:                ERCP Indications:              Epigastric abdominal pain, Jaundice, Abnormal liver                            function test, Follow-up of acute pancreatitis,                            Tumor of the head of pancreas Providers:                Justice Britain, MD, Clyde Lundborg, RN, Cletis Athens, Technician Referring MD:             Thornton Park MD, MD, [facility] Medicines:                General Anesthesia, Unasyn 3 g IV, Glucagon 0.5 mg                            IV, Indomethacin 100 mg PR (only given after                            inability to cannulate the ampulla and multiple                            attempts at ampullary transition Complications:            No immediate complications. Estimated Blood Loss:     Estimated blood loss was minimal. Procedure:                Pre-Anesthesia Assessment:                           - Prior to the procedure, a History and Physical                            was performed, and patient medications and                            allergies were reviewed. The patient's tolerance of                            previous anesthesia was also reviewed. The risks                            and benefits of the procedure and the sedation                            options and risks were discussed with the patient.  All questions were answered, and informed consent                            was obtained. Prior Anticoagulants: The patient has                            taken heparin, last dose was 1 day prior to                            procedure. ASA Grade Assessment: III - A patient                            with severe systemic disease. After  reviewing the                            risks and benefits, the patient was deemed in                            satisfactory condition to undergo the procedure.                           After obtaining informed consent, the scope was                            passed under direct vision. Throughout the                            procedure, the patient's blood pressure, pulse, and                            oxygen saturations were monitored continuously. The                            [employer] D single use                            duodenoscope was introduced through the mouth, and                            used to inject contrast into without successful                            cannulation. The ERCP was extremely difficult due                            to challenging cannulation. The patient tolerated                            the procedure. The [id number]-Q180V ([id number] Olympus                            duodenoscope was introduced through the mouth, and  used to inject contrast into without successful                            cannulation. Scope In: Scope Out: Findings:      The scout film was normal.      The esophagus was successfully intubated under direct vision without       detailed examination of the pharynx, larynx, and associated structures,       and upper GI tract. The major papilla was flat and on an angle.      The bile duct could not be cannulated with the Autotome sphincterotome,       Hydratome sphincterotome and Revolution Jagtome sphincterotome using       multiple wires including straight and angled wires. Switching from the       Exalt to the Olympus ERCP scope made no difference in regards to       cannulation attempts. Tried to go into long-position but the angulation       of the presumed ampulla did not allow this to be steady/seated position.      A pancreatogram was not able to be performed.      The  duodenoscopes were withdrawn from the patient. Impression:               - The major papilla appeared to be flat and                            angulated.                           - Failed biliary/pancreatic cannulation. Recommendation:           - The patient will be observed post-procedure,                            until all discharge criteria are met.                           - Return patient to hospital ward for ongoing care.                           - Observe patient's clinical course.                           - Continue LR at 150 cc/hr x 12 hours in effort of                            trying to decrease risk of post-ERCP Pancreatitis                            after failed attempt.                           - Observe patient's clinical course.                           - Trend LFTs and CBC and Lipase into tomorrow.                           -  Watch for pancreatitis, bleeding, perforation,                            and cholangitis.                           - Will discuss with patient tomorrow, consideration                            of IR PBD placement for eventual [facility]                            procedure here in [place] vs in setting of her                            high-risk status, transfer to [facility] for                            further workup/management.                           - Awaiting EUS cytology.                           - The findings and recommendations were discussed                            with the patient.                           - The findings and recommendations were discussed                            with the patient's family. Procedure Code(s):        --- Professional ---                           [phone], Esophagogastroduodenoscopy, flexible,                            transoral; diagnostic, including collection of                            specimen(s) by brushing or washing, when performed                             (separate procedure) Diagnosis Code(s):        --- Professional ---                           R10.13, Epigastric pain                           R17, Unspecified jaundice                           R94.5, Abnormal results of liver function studies  K85.90, Acute pancreatitis without necrosis or                            infection, unspecified                           D49.0, Neoplasm of unspecified behavior of                            digestive system                           K83.8, Other specified diseases of biliary tract CPT copyright 2019 American Medical Association. All rights reserved. The codes documented in this report are preliminary and upon coder review may  be revised to meet current compliance requirements. Justice Britain, MD 12/02/2020 7:00:17 PM Number of Addenda: 0

## 2020-12-02 NOTE — Anesthesia Procedure Notes (Signed)
Procedure Name: Intubation Date/Time: 12/02/2020 3:50 PM Performed by: Georgia Duff, CRNA Pre-anesthesia Checklist: Patient identified, Emergency Drugs available, Suction available and Patient being monitored Patient Re-evaluated:Patient Re-evaluated prior to induction Oxygen Delivery Method: Circle System Utilized Preoxygenation: Pre-oxygenation with 100% oxygen Induction Type: IV induction Ventilation: Mask ventilation without difficulty Laryngoscope Size: Miller and 2 Grade View: Grade I Tube type: Oral Tube size: 7.0 mm Number of attempts: 1 Airway Equipment and Method: Stylet and Oral airway Placement Confirmation: ETT inserted through vocal cords under direct vision,  positive ETCO2 and breath sounds checked- equal and bilateral Secured at: 21 cm Tube secured with: Tape Dental Injury: Teeth and Oropharynx as per pre-operative assessment

## 2020-12-02 NOTE — Transfer of Care (Signed)
Immediate Anesthesia Transfer of Care Note  Patient: Cynthia [name]  Procedure(s) Performed: ESOPHAGOGASTRODUODENOSCOPY (EGD) (N/A ) UPPER ESOPHAGEAL ENDOSCOPIC ULTRASOUND (EUS) (N/A ) ENDOSCOPIC RETROGRADE CHOLANGIOPANCREATOGRAPHY (ERCP) (N/A ) BIOPSY SPHINCTEROTOMY  Patient Location: PACU  Anesthesia Type:General  Level of Consciousness: drowsy and patient cooperative  Airway & Oxygen Therapy: Patient Spontanous Breathing  Post-op Assessment: Report given to RN and Post -op Vital signs reviewed and stable  Post vital signs: Reviewed and stable  Last Vitals:  Vitals Value Taken Time  BP    Temp    Pulse 71 12/02/20 1837  Resp 20 12/02/20 1837  SpO2 100 % 12/02/20 1837  Vitals shown include unvalidated device data.  Last Pain:  Vitals:   12/02/20 1451  TempSrc:   PainSc: 0-No pain      Patients Stated Pain Goal: 0 (53/29/92 4268)  Complications: No complications documented.

## 2020-12-02 NOTE — Op Note (Addendum)
[facility] Patient Name: Cynthia [name] Procedure Date : 12/02/2020 MRN: [id number] Attending MD: Justice Britain , MD Date of Birth: September 21, 1980 CSN: [id number] Age: 40 Admit Type: Inpatient Procedure:                Upper EUS Indications:              Common bile duct dilation (acquired) seen on MRCP,                            Dilated pancreatic duct on MRCP, Suspected mass in                            pancreas on MRCP, Elevated liver enzymes, Elevated                            CA 19-9 Providers:                Justice Britain, MD, Clyde Lundborg, RN, Cletis Athens, Technician Referring MD:             Thornton Park MD, MD, [facility] Medicines:                Monitored Anesthesia Care Complications:            No immediate complications. Estimated Blood Loss:     Estimated blood loss was minimal. Procedure:                Pre-Anesthesia Assessment:                           - Prior to the procedure, a History and Physical                            was performed, and patient medications and                            allergies were reviewed. The patient's tolerance of                            previous anesthesia was also reviewed. The risks                            and benefits of the procedure and the sedation                            options and risks were discussed with the patient.                            All questions were answered, and informed consent                            was obtained. Prior Anticoagulants: The patient has  taken heparin, last dose was 1 day prior to                            procedure. ASA Grade Assessment: III - A patient                            with severe systemic disease. After reviewing the                            risks and benefits, the patient was deemed in                            satisfactory condition to undergo the procedure.                            After obtaining informed consent, the endoscope was                            passed under direct vision. Throughout the                            procedure, the patient's blood pressure, pulse, and                            oxygen saturations were monitored continuously. The                            [id number]-H190 ([id number]) Olympus gastroscope was                            introduced through the mouth, and advanced to the                            second part of duodenum. After obtaining informed                            consent, the endoscope was passed under direct                            vision. Throughout the procedure, the patient's                            blood pressure, pulse, and oxygen saturations were                            monitored continuously. The upper EUS was                            accomplished without difficulty. The patient                            tolerated the procedure. Scope In: Scope Out: Findings:      ENDOSCOPIC FINDING: :      No gross lesions were noted  in the entire esophagus.      The Z-line was irregular and was found 39 cm from the incisors.      Patchy mildly erythematous mucosa without bleeding was found in the       gastric body. Biopsies were taken with a cold forceps for histology and       Helicobacter pylori testing.      A deformity was found in the entire examined stomach.      No gross lesions were noted in the duodenal bulb, in the first portion       of the duodenum and in the second portion of the duodenum.      The major papilla was normal.      ENDOSONOGRAPHIC FINDING: :      An irregular mass-like region was identified in the pancreatic head in       the vicinity of pancreatic duct and common bile duct dilation. The area       was isoechoic. The lesion measured 19 mm by 20 mm in maximal       cross-sectional diameter. The outer margins were irregular. There was       sonographic evidence suggesting  abutment into the superior mesenteric       vein. An intact interface was seen between the lesion and the superior       mesenteric artery and celiac trunk suggesting a lack of invasion. The       remainder of the pancreas was examined. The endosonographic appearance       of parenchyma showed lobularity and hyperechoic strands and the upstream       pancreatic duct indicated duct dilation (PDH = 5.8 mm, PDN = 5.3 mm, PDB       = 4.7 mm, PDT = 2.9 mm). Fine needle biopsy was performed of the lesion.       Color Doppler imaging was utilized prior to needle puncture to confirm a       lack of significant vascular structures within the needle path. Seven       passes were made with the Aquire 22 gauge ultrasound core biopsy needle       using a transduodenal approach. Visible cores of tissue were obtained.       Preliminary cytologic examination and touch preps were performed. Final       cytology results are pending.      There was dilation in the common bile duct (11.0 mm) and in the common       hepatic duct which measured up to 11.3 mm.      Moderate hyperechoic material consistent with sludge was visualized       endosonographically in the common bile duct, in the common hepatic duct       and in the gallbladder.      Endosonographic imaging of the ampulla showed no intramural       (subepithelial) lesion.      No malignant-appearing lymph nodes were visualized in the celiac region       (level 20), peripancreatic region and porta hepatis region.      The celiac region was visualized. Impression:               EGD Impression:                           - No gross lesions in esophagus. Z-line irregular,  39 cm from the incisors.                           - Erythematous mucosa in the gastric body. Biopsied.                           - J-shaped deformity in the entire stomach.                           - No gross lesions in the duodenal bulb, in the                             first portion of the duodenum and in the second                            portion of the duodenum.                           - Normal major papilla.                           EUS Impression:                           - A mass-like region was identified in the                            pancreatic head right where CBD and PD dilation                            occured. It was isointense rather than typical                            hypointense on EUS. Cytology results are pending.                            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                            This was staged T2 N0 Mx by endosonographic                            criteria. The staging applies if malignancy is                            confirmed. Fine needle biopsy performed.                           - There was dilation in the common bile duct and in  the common hepatic duct which measured up to 10 mm.                           - Hyperechoic material consistent with sludge was                            visualized endosonographically in the common bile                            duct, in the common hepatic duct and in the                            gallbladder.                           - No malignant-appearing lymph nodes were                            visualized in the celiac region (level 20),                            peripancreatic region and porta hepatis region. Recommendation:           - Proceed to scheduled ERCP.                           - Observe patient's clinical course.                           - Await cytology results and await path results.                           - The findings and recommendations were discussed                            with the patient.                           - The findings and recommendations were discussed                            with the  patient's family. Procedure Code(s):        --- Professional ---                           [phone], Esophagogastroduodenoscopy, flexible,                            transoral; with transendoscopic ultrasound-guided                            intramural or transmural fine needle                            aspiration/biopsy(s), (includes endoscopic  ultrasound examination limited to the esophagus,                            stomach or duodenum, and adjacent structures) Diagnosis Code(s):        --- Professional ---                           K22.8, Other specified diseases of esophagus                           K31.89, Other diseases of stomach and duodenum                           K86.89, Other specified diseases of pancreas                           K83.8, Other specified diseases of biliary tract                           I89.9, Noninfective disorder of lymphatic vessels                            and lymph nodes, unspecified                           R74.8, Abnormal levels of other serum enzymes                           R97.8, Other abnormal tumor markers                           R93.3, Abnormal findings on diagnostic imaging of                            other parts of digestive tract CPT copyright 2019 American Medical Association. All rights reserved. The codes documented in this report are preliminary and upon coder review may  be revised to meet current compliance requirements. Justice Britain, MD 12/02/2020 6:44:50 PM Number of Addenda: 1 Addendum Number: 1   Addendum Date: 12/05/2020 2:07:07 AM      Based on EUS imaging, concern is for malignant process of the pancreas.       Atypical Adenocarcinoma vs Neuroendocrine Tumor is highest on       differential. Possibility of Inflammatory mass is also on differential,       though elevated CA19-9 makes malignancy more concerning in the setting       of a double-duct sign. Justice Britain, MD 12/05/2020  2:08:11 AM

## 2020-12-02 NOTE — Progress Notes (Signed)
Pt has been doing clears since 0500 this am and will be NPO after 0800. Pt is aware.  Eleanora Neighbor, RN

## 2020-12-02 NOTE — Progress Notes (Signed)
PROGRESS NOTE    Cynthia [name]  QIH:[id number] DOB: [birth year] DOA: 11/29/2020 PCP: [facility]    Brief Narrative:  40 year old female with history of smoking, remote history of pulmonary embolism, currently [redacted] weeks pregnant who is having abdominal pain for about 7 to 10 days now presents back to the emergency room with ongoing abdominal pain.  Patient started having pain 10 days ago, was admitted to the hospital on 3/14 with pancreatitis, pancreatic head tumor and obstructive jaundice.  She was also found to have [redacted] weeks pregnant.  Patient was a scheduled for ERCP, however she left AMA.  She thought about the options of not treating, now decided to get treatment so came back to the ER.  Followed by GI.   Assessment & Plan:   Principal Problem:   Pancreatitis Active Problems:   Cigarette smoker   First trimester pregnancy   Obstructive jaundice   Pancreatic mass   History of pulmonary embolus (PE)   Transaminitis  Obstructive jaundice/abdominal pain/pancreatic head mass with acute pancreatitis: Currently pain-free.  Tolerating regular diet.  Continues to have obstructive jaundice.  MRCP consistent with pancreatic head mass.  Elevated CA 19-9. Maintenance IV fluids.  Adequate pain medications. Patient is a scheduled for ERCP and EUS biopsy today.  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  Currently pain-free. Has some generalized itching, using Benadryl.  First trimester pregnancy: Pregnant with ninth child, she has 21 living children at home 21 -38-year-old.  12-week pregnant at this time.  Previable. OB/GYN consultation 3/14 by Dr. Vivien Rota, MD recommended no indication for fetal monitoring, continue to treat medical conditions and use safe medications. Patient is aware about possible risks to the first trimester pregnancy and fetus.  History of pulmonary embolism: Developed bilateral PE on 05/2015 while on OCP , postpartum 3 months. Hypercoagulable work-up including  anticardiolipin antibody, lupus anticoagulant, beta-2 glycoprotein, antithrombin activities normal.  Protein C, protein S normal functional. She was on anticoagulation for 6 months then discontinued.  Currently denies any active bleeding issues. If positive for malignancy, will recommend Lovenox to continue throughout pregnancy on discharge.  Smoker: On nicotine patch.  Counseled.   DVT prophylaxis: heparin injection 5,000 Units Start: 11/30/20 1445   Code Status: Full code Family Communication: None.  Patient is talking to her family. Disposition Plan: Status is: Inpatient  Remains inpatient appropriate because:Ongoing diagnostic testing needed not appropriate for outpatient work up   Dispo: The patient is from: Home              Anticipated d/c is to: Home              Patient currently is not medically stable to d/c.   Difficult to place patient No         Consultants:   Gastroenterology  Procedures:   None  Antimicrobials:   None   Subjective: Patient seen and examined.  She has some itching otherwise denies any complaints.  No abdominal pain.  Denies any nausea or vomiting.  Objective: Vitals:   12/01/20 1704 12/01/20 2110 12/02/20 0528 12/02/20 1003  BP: 109/63 110/71 107/60 106/61  Pulse: 65 60 67 (!) 59  Resp: 16   18  Temp: 98.3 F (36.8 C) 98.4 F (36.9 C) 98.3 F (36.8 C) 98.3 F (36.8 C)  TempSrc:  Oral  Oral  SpO2: 100% 100% 97% 98%  Weight:      Height:        Intake/Output Summary (Last 24 hours) at 12/02/2020 1141  Last data filed at 12/02/2020 0900 Gross per 24 hour  Intake 850 ml  Output 0 ml  Net 850 ml   Filed Weights   11/29/20 2014  Weight: 76 kg    Examination:  General exam: Appears calm and comfortable  Respiratory system: Clear to auscultation. Respiratory effort normal. Cardiovascular system: S1 & S2 heard, RRR. No JVD, murmurs, rubs, gallops or clicks. No pedal edema. Gastrointestinal system: Abdomen is  nondistended, soft and nontender. No organomegaly or masses felt. Normal bowel sounds heard. Central nervous system: Alert and oriented. No focal neurological deficits. Extremities: Symmetric 5 x 5 power. Skin: No rashes, lesions or ulcers Psychiatry: Judgement and insight appear normal. Mood & affect appropriate.     Data Reviewed: I have personally reviewed following labs and imaging studies  CBC: Recent Labs  Lab 11/26/20 0342 11/27/20 0354 11/28/20 0351 11/29/20 2054  WBC 6.3 6.0 5.4 6.9  NEUTROABS  --   --   --  4.9  HGB 11.1* 11.7* 11.1* 11.9*  HCT 32.8* 34.1* 32.0* 33.9*  MCV 85.9 84.8 83.8 83.9  PLT 265 295 274 833   Basic Metabolic Panel: Recent Labs  Lab 11/26/20 0342 11/27/20 0354 11/28/20 0351 11/29/20 2054 12/01/20 0300  NA 136 138 138 132* 136  K 3.3* 3.8 3.8 4.1 3.5  CL 106 107 108 100 107  CO2 19* 23 19* 23 22  GLUCOSE 64* 95 129* 95 112*  BUN 7 <5* 5* <5* <5*  CREATININE 0.38* 0.45 0.39* 0.58 0.49  CALCIUM 8.7* 9.3 9.1 9.1 8.7*  MG 1.6*  --   --   --   --    GFR: Estimated Creatinine Clearance: 98.2 mL/min (by C-G formula based on SCr of 0.49 mg/dL). Liver Function Tests: Recent Labs  Lab 11/26/20 0342 11/27/20 0354 11/28/20 0351 11/29/20 2054 12/01/20 0300  AST 126* 193* 259* 235* 227*  ALT 180* 201* 223* 246* 221*  ALKPHOS 297* 365* 327* 373* 353*  BILITOT 5.4* 6.0* 5.4* 7.2* 6.2*  PROT 6.1* 6.8 6.2* 6.5 5.3*  ALBUMIN 3.2* 3.4* 3.0* 3.1* 2.5*   Recent Labs  Lab 11/26/20 0342 11/27/20 0354 11/29/20 2054  LIPASE 80* 122* 386*   No results for input(s): AMMONIA in the last 168 hours. Coagulation Profile: Recent Labs  Lab 11/28/20 0351  INR 0.9   Cardiac Enzymes: No results for input(s): CKTOTAL, CKMB, CKMBINDEX, TROPONINI in the last 168 hours. BNP (last 3 results) No results for input(s): PROBNP in the last 8760 hours. HbA1C: No results for input(s): HGBA1C in the last 72 hours. CBG: Recent Labs  Lab 11/27/20 2026  11/27/20 2315 11/28/20 0359 11/28/20 0722 11/28/20 1133  GLUCAP 129* 113* 139* 103* 102*   Lipid Profile: No results for input(s): CHOL, HDL, LDLCALC, TRIG, CHOLHDL, LDLDIRECT in the last 72 hours. Thyroid Function Tests: No results for input(s): TSH, T4TOTAL, FREET4, T3FREE, THYROIDAB in the last 72 hours. Anemia Panel: No results for input(s): VITAMINB12, FOLATE, FERRITIN, TIBC, IRON, RETICCTPCT in the last 72 hours. Sepsis Labs: No results for input(s): PROCALCITON, LATICACIDVEN in the last 168 hours.  Recent Results (from the past 240 hour(s))  Resp Panel by RT-PCR (Flu A&B, Covid) Nasopharyngeal Swab     Status: None   Collection Time: 11/25/20  8:51 AM   Specimen: Nasopharyngeal Swab; Nasopharyngeal(NP) swabs in vial transport medium  Result Value Ref Range Status   SARS Coronavirus 2 by RT PCR NEGATIVE NEGATIVE Final    Comment: (NOTE) SARS-CoV-2 target nucleic acids are NOT  DETECTED.  The SARS-CoV-2 RNA is generally detectable in upper respiratory specimens during the acute phase of infection. The lowest concentration of SARS-CoV-2 viral copies this assay can detect is 138 copies/mL. A negative result does not preclude SARS-Cov-2 infection and should not be used as the sole basis for treatment or other patient management decisions. A negative result may occur with  improper specimen collection/handling, submission of specimen other than nasopharyngeal swab, presence of viral mutation(s) within the areas targeted by this assay, and inadequate number of viral copies(<138 copies/mL). A negative result must be combined with clinical observations, patient history, and epidemiological information. The expected result is Negative.  Fact Sheet for Patients:  EntrepreneurPulse.com.au  Fact Sheet for Healthcare Providers:  IncredibleEmployment.be  This test is no t yet approved or cleared by the [place] FDA and  has been authorized  for detection and/or diagnosis of SARS-CoV-2 by FDA under an Emergency Use Authorization (EUA). This EUA will remain  in effect (meaning this test can be used) for the duration of the COVID-19 declaration under Section 564(b)(1) of the Act, 21 U.S.C.section 360bbb-3(b)(1), unless the authorization is terminated  or revoked sooner.       Influenza A by PCR NEGATIVE NEGATIVE Final   Influenza B by PCR NEGATIVE NEGATIVE Final    Comment: (NOTE) The Xpert Xpress SARS-CoV-2/FLU/RSV plus assay is intended as an aid in the diagnosis of influenza from Nasopharyngeal swab specimens and should not be used as a sole basis for treatment. Nasal washings and aspirates are unacceptable for Xpert Xpress SARS-CoV-2/FLU/RSV testing.  Fact Sheet for Patients: EntrepreneurPulse.com.au  Fact Sheet for Healthcare Providers: IncredibleEmployment.be  This test is not yet approved or cleared by the [place] FDA and has been authorized for detection and/or diagnosis of SARS-CoV-2 by FDA under an Emergency Use Authorization (EUA). This EUA will remain in effect (meaning this test can be used) for the duration of the COVID-19 declaration under Section 564(b)(1) of the Act, 21 U.S.C. section 360bbb-3(b)(1), unless the authorization is terminated or revoked.  Performed at [facility], [place] [address], [place], [place] [address]   Resp Panel by RT-PCR (Flu A&B, Covid) Nasopharyngeal Swab     Status: None   Collection Time: 11/29/20  8:30 PM   Specimen: Nasopharyngeal Swab; Nasopharyngeal(NP) swabs in vial transport medium  Result Value Ref Range Status   SARS Coronavirus 2 by RT PCR NEGATIVE NEGATIVE Final    Comment: (NOTE) SARS-CoV-2 target nucleic acids are NOT DETECTED.  The SARS-CoV-2 RNA is generally detectable in upper respiratory specimens during the acute phase of infection. The lowest concentration of SARS-CoV-2 viral copies this  assay can detect is 138 copies/mL. A negative result does not preclude SARS-Cov-2 infection and should not be used as the sole basis for treatment or other patient management decisions. A negative result may occur with  improper specimen collection/handling, submission of specimen other than nasopharyngeal swab, presence of viral mutation(s) within the areas targeted by this assay, and inadequate number of viral copies(<138 copies/mL). A negative result must be combined with clinical observations, patient history, and epidemiological information. The expected result is Negative.  Fact Sheet for Patients:  EntrepreneurPulse.com.au  Fact Sheet for Healthcare Providers:  IncredibleEmployment.be  This test is no t yet approved or cleared by the [place] FDA and  has been authorized for detection and/or diagnosis of SARS-CoV-2 by FDA under an Emergency Use Authorization (EUA). This EUA will remain  in effect (meaning this test can be used) for  the duration of the COVID-19 declaration under Section 564(b)(1) of the Act, 21 U.S.C.section 360bbb-3(b)(1), unless the authorization is terminated  or revoked sooner.       Influenza A by PCR NEGATIVE NEGATIVE Final   Influenza B by PCR NEGATIVE NEGATIVE Final    Comment: (NOTE) The Xpert Xpress SARS-CoV-2/FLU/RSV plus assay is intended as an aid in the diagnosis of influenza from Nasopharyngeal swab specimens and should not be used as a sole basis for treatment. Nasal washings and aspirates are unacceptable for Xpert Xpress SARS-CoV-2/FLU/RSV testing.  Fact Sheet for Patients: EntrepreneurPulse.com.au  Fact Sheet for Healthcare Providers: IncredibleEmployment.be  This test is not yet approved or cleared by the [place] FDA and has been authorized for detection and/or diagnosis of SARS-CoV-2 by FDA under an Emergency Use Authorization (EUA). This EUA will  remain in effect (meaning this test can be used) for the duration of the COVID-19 declaration under Section 564(b)(1) of the Act, 21 U.S.C. section 360bbb-3(b)(1), unless the authorization is terminated or revoked.  Performed at [facility], [place] [address], [place], [place] [address]          Radiology Studies: No results found.      Scheduled Meds: . nicotine  14 mg Transdermal Daily  . sodium chloride flush  3 mL Intravenous Q12H   Continuous Infusions: . sodium chloride 150 mL/hr at 12/02/20 0203     LOS: 2 days    Time spent: 30 minutes    Barb Merino, MD [facility] Pager [phone]

## 2020-12-02 NOTE — Consult Note (Signed)
Reason for Consult:Pre-operative consultation   Cynthia [name] is an 40 y.o. female [phone] at [redacted]w[redacted]d by early ultrasound admitted with pancreatitis with obstructive jaundice thought to be secondary to a pancreatic mass. Patient reports doing better this morning. She denies any pelvic pain, vaginal bleeding or abnormal discharge. Patient has not started prenatal care for this pregnancy. She had her last two pregnancies with [facility] and plans to establish care with them upon discharge. Patient with history of PE 3 months following delivery in 2016. Unclear whether PE is secondary to hypercoagulable state s/p c-section vs use of COC in the setting of active smoker.     Past Medical History:  Diagnosis Date  . History of pulmonary embolism   . Normal pregnancy in multigravida in third trimester 07/18/2018  . Tobacco abuse     Past Surgical History:  Procedure Laterality Date  . CESAREAN SECTION    . CESAREAN SECTION N/A 02/27/2015   Procedure: CESAREAN SECTION;  Surgeon: Newton Pigg, MD;  Location: [facility];  Service: Obstetrics;  Laterality: N/A;  . CESAREAN SECTION N/A 08/03/2018   Procedure: REPEAT CESAREAN SECTION;  Surgeon: Janyth Contes, MD;  Location: [facility];  Service: Obstetrics;  Laterality: N/A;  Cynthia [name], RNFA    Family History  Problem Relation Age of Onset  . Hypertension Mother   . Hypertension Father     Social History:  reports that she has been smoking cigarettes. She has a 6.50 pack-year smoking history. She has never used smokeless tobacco. She reports that she does not drink alcohol and does not use drugs.  Allergies: No Known Allergies  Medications: I have reviewed the patient's current medications.  Review of Systems See pertinent in HPI Blood pressure 106/61, pulse (!) 59, temperature 98.3 F (36.8 C), temperature source Oral, resp. rate 18, height 5\' 5"  (1.651 m), weight 76 kg, last menstrual period 01/13/2020, SpO2 98 %, unknown  if currently breastfeeding. Physical Exam  Blood pressure 106/61, pulse (!) 59, temperature 98.3 F (36.8 C), temperature source Oral, resp. rate 18, height 5\' 5"  (1.651 m), weight 76 kg, last menstrual period 01/13/2020, SpO2 98 %, unknown if currently breastfeeding.  GENERAL: Well-developed, well-nourished female in no acute distress.  LUNGS: Clear to auscultation bilaterally.  HEART: Regular rate and rhythm. ABDOMEN: Soft, nontender, nondistended. No organomegaly. PELVIC: Not indicated EXTREMITIES: No cyanosis, clubbing, or edema, 2+ distal pulses.  Results for orders placed or performed during the hospital encounter of 11/29/20 (from the past 48 hour(s))  Urinalysis, Routine w reflex microscopic Urine, Clean Catch     Status: Abnormal   Collection Time: 11/30/20  1:08 PM  Result Value Ref Range   Color, Urine AMBER (A) YELLOW    Comment: BIOCHEMICALS MAY BE AFFECTED BY COLOR   APPearance CLOUDY (A) CLEAR   Specific Gravity, Urine 1.016 1.005 - 1.030   pH 5.0 5.0 - 8.0   Glucose, UA NEGATIVE NEGATIVE mg/dL   Hgb urine dipstick SMALL (A) NEGATIVE   Bilirubin Urine MODERATE (A) NEGATIVE   Ketones, ur 20 (A) NEGATIVE mg/dL   Protein, ur 30 (A) NEGATIVE mg/dL   Nitrite NEGATIVE NEGATIVE   Leukocytes,Ua NEGATIVE NEGATIVE   RBC / HPF 0-5 0 - 5 RBC/hpf   WBC, UA 0-5 0 - 5 WBC/hpf   Bacteria, UA NONE SEEN NONE SEEN   Squamous Epithelial / LPF 21-50 0 - 5   Mucus PRESENT     Comment: Performed at [facility], [address],  [place] [address]  Comprehensive metabolic panel     Status: Abnormal   Collection Time: 12/01/20  3:00 AM  Result Value Ref Range   Sodium 136 135 - 145 mmol/L   Potassium 3.5 3.5 - 5.1 mmol/L   Chloride 107 98 - 111 mmol/L   CO2 22 22 - 32 mmol/L   Glucose, Bld 112 (H) 70 - 99 mg/dL    Comment: Glucose reference range applies only to samples taken after fasting for at least 8 hours.   BUN <5 (L) 6 - 20 mg/dL   Creatinine, Ser 0.49 0.44 -  1.00 mg/dL   Calcium 8.7 (L) 8.9 - 10.3 mg/dL   Total Protein 5.3 (L) 6.5 - 8.1 g/dL   Albumin 2.5 (L) 3.5 - 5.0 g/dL   AST 227 (H) 15 - 41 U/L   ALT 221 (H) 0 - 44 U/L   Alkaline Phosphatase 353 (H) 38 - 126 U/L   Total Bilirubin 6.2 (H) 0.3 - 1.2 mg/dL   GFR, Estimated >60 >60 mL/min    Comment: (NOTE) Calculated using the CKD-EPI Creatinine Equation (2021)    Anion gap 7 5 - 15    Comment: Performed at [facility], [place] [address] [place], [place] [address]    No results found.  Assessment/Plan: 40 yo P8008 at [redacted]w[redacted]d with acute pancreatitis and a pancreatic mass - Patient is scheduled for EUS with possible ERCP - No obstetrical contraindications to this procedure at the current gestational age - No fetal interventions needed pre or post- procedure - Would recommend DVT prophylaxis with lovenox post procedure and upon discharge home.  - Patient to follow up with [facility] or alternatively she can establish care with [facility] care - Please re-consult with any questions or concerns   Dawnell Bryant 12/02/2020

## 2020-12-02 NOTE — Anesthesia Postprocedure Evaluation (Signed)
Anesthesia Post Note  Patient: Cynthia [name]  Procedure(s) Performed: ESOPHAGOGASTRODUODENOSCOPY (EGD) (N/A ) UPPER ESOPHAGEAL ENDOSCOPIC ULTRASOUND (EUS) (N/A ) ENDOSCOPIC RETROGRADE CHOLANGIOPANCREATOGRAPHY (ERCP) (N/A ) BIOPSY     Patient location during evaluation: PACU Anesthesia Type: General Level of consciousness: awake and alert Pain management: pain level controlled Vital Signs Assessment: post-procedure vital signs reviewed and stable Respiratory status: spontaneous breathing, nonlabored ventilation, respiratory function stable and patient connected to nasal cannula oxygen Cardiovascular status: blood pressure returned to baseline and stable Postop Assessment: no apparent nausea or vomiting Anesthetic complications: no   No complications documented.  Last Vitals:  Vitals:   12/02/20 2103 12/02/20 2109  BP: (!) 103/59 128/81  Pulse: 60 79  Resp:  18  Temp: 36.7 C 37.3 C  SpO2: 100% 99%    Last Pain:  Vitals:   12/02/20 2109  TempSrc: Oral  PainSc:                  Tiajuana Amass

## 2020-12-02 NOTE — Progress Notes (Signed)
     [facility] Progress Note ° °CC:  Pancreatic mass, dilated PD and CBD, elevated LFTs ° °Subjective:  She had clear liquids for breakfast then NPO for EUS/ECP later today. She is having mild epigastric pain. No N/V. She passed a soft grayish BM yesterday.  ° °Objective:  °Vital signs in last 24 hours: °Temp:  [97.9 °F (36.6 °C)-98.4 °F (36.9 °C)] 98.3 °F (36.8 °C) (03/21 0528) °Pulse Rate:  [60-67] 67 (03/21 0528) °Resp:  [16-18] 16 (03/20 1704) °BP: (107-110)/(60-71) 107/60 (03/21 0528) °SpO2:  [97 %-100 %] 97 % (03/21 0528) °  °General:   Alert 40 year old female in NAD. °Eyes: Scleral icterus present . °Heart: RRR, no murmur.  °Pulm:  Breath sounds clear throughout.  °Abdomen: Soft, nondistended. Mild epigastric tenderness without rebound or guarding. Positive bowel sounds x 4 quads,  °Extremities:  Without edema. °Neurologic:  Alert and  oriented x 4. Grossly normal neurologically. °Psych:  Alert and cooperative. Normal mood and affect. ° °Intake/Output from previous day: °03/20 0701 - 03/21 0700 °In: 940 [P.O.:940] °Out: -  °Intake/Output this shift: °No intake/output data recorded. ° °Lab Results: °Recent Labs  °  11/29/20 °2054  °WBC 6.9  °HGB 11.9*  °HCT 33.9*  °PLT 289  ° °BMET °Recent Labs  °  11/29/20 °2054 12/01/20 °0300  °NA 132* 136  °K 4.1 3.5  °CL 100 107  °CO2 23 22  °GLUCOSE 95 112*  °BUN <5* <5*  °CREATININE 0.58 0.49  °CALCIUM 9.1 8.7*  ° °LFT °Recent Labs  °  12/01/20 °0300  °PROT 5.3*  °ALBUMIN 2.5*  °AST 227*  °ALT 221*  °ALKPHOS 353*  °BILITOT 6.2*  ° °PT/INR °No results for input(s): LABPROT, INR in the last 72 hours. °Hepatitis Panel °No results for input(s): HEPBSAG, HCVAB, HEPAIGM, HEPBIGM in the last 72 hours. ° °No results found. ° °Assessment / Plan: ° °1. 40 year old female 12 -[redacted] weeks gestation with obstructive jaundice secondary to mass at the head of the pancreas. Abdominal MRI/MRCP 3/15 identified a 2.0 x 1.9 cm mass in the head of the pancreas with PD  dilatation, intra/extra hepatic biliary duct distension with abrupt termination in the pancreatic portion of the CBD.  Ca 19-9 level 481.  LFTS stable. Today Alk phos 353. T. Bili 6.2. AST 277. ALT 221. Heparin SQ on hold.  °-EUS +/- ERFCP with Dr.Mansouraty tentatively scheduled at 3pm today. EUS/ERCP benefits and risks discussed including risk with sedation, risk of bleeding, perforation,  infection and pancreatitis. Refer to Dr. Mansouraty's progress note with extensive discussion with the patient regarding benefits/risks of EUS/ERCP during pregnancy.  °-OB consult requested prior to patient proceeding with EUS +/- ERCP °-NPO °-Continue NS IV @ 150cc/hr for now °-Zofran 4 mg IV Q 6 hrs PRN °-Further recommendations to be determined after EUS/+/-ERCP completed  ° °2. Mild normocytic anemia. No overt GI bleeding.  ° ° ° ° ° °Principal Problem: °  Pancreatitis °Active Problems: °  Cigarette smoker °  First trimester pregnancy °  Obstructive jaundice °  Pancreatic mass °  History of pulmonary embolus (PE) °  Transaminitis ° ° ° ° LOS: 2 days  ° °Cynthia [name]  12/02/2020, 8:37 AM ° ° ° °

## 2020-12-02 NOTE — Anesthesia Preprocedure Evaluation (Addendum)
Anesthesia Evaluation  Patient identified by MRN, date of birth, ID band Patient awake    Reviewed: Allergy & Precautions, NPO status , Patient's Chart, lab work & pertinent test results  History of Anesthesia Complications Negative for: history of anesthetic complications  Airway Mallampati: II  TM Distance: >3 FB Neck ROM: Full    Dental  (+) Dental Advisory Given, Teeth Intact   Pulmonary neg shortness of breath, neg sleep apnea, neg COPD, neg recent URI, Current Smoker and Patient abstained from smoking.,  Covid-19 Nucleic Acid Test Results Lab Results      Component                Value               Date                      SARSCOV2NAA              NEGATIVE            11/29/2020                [place]              NEGATIVE            11/25/2020          '    breath sounds clear to auscultation       Cardiovascular negative cardio ROS   Rhythm:Regular     Neuro/Psych negative neurological ROS  negative psych ROS   GI/Hepatic Lab Results      Component                Value               Date                      ALT                      221 (H)             12/01/2020                AST                      227 (H)             12/01/2020                ALKPHOS                  353 (H)             12/01/2020                BILITOT                  6.2 (H)             12/01/2020            obstructive jaundice.  Pancreatic mass.  Dilated pancreatic duct, dilated bile duct   Endo/Other  No results found for: HGBA1C   Renal/GU negative Renal ROSLab Results      Component                Value               Date  CREATININE               0.49                12/01/2020                Musculoskeletal negative musculoskeletal ROS (+)   Abdominal   Peds  Hematology  (+) Blood dyscrasia, anemia , Lab Results      Component                Value               Date                      WBC                       6.9                 11/29/2020                HGB                      11.9 (L)            11/29/2020                HCT                      33.9 (L)            11/29/2020                MCV                      83.9                11/29/2020                PLT                      289                 11/29/2020              Anesthesia Other Findings Assessment/Plan: 40 yo P8008 at [redacted]w[redacted]d with acute pancreatitis and a pancreatic mass - Patient is scheduled for EUS with possible ERCP - No obstetrical contraindications to this procedure at the current gestational age - No fetal interventions needed pre or post- procedure - Would recommend DVT prophylaxis with lovenox post procedure and upon discharge home.  - Patient to follow up with [facility] or alternatively she can establish care with [facility] - Please re-consult with any questions or concerns   Peggy Constant 12/02/2020   Reproductive/Obstetrics (+) Pregnancy                            Anesthesia Physical Anesthesia Plan  ASA: II  Anesthesia Plan: General   Post-op Pain Management:    Induction: Intravenous, Rapid sequence and Cricoid pressure planned  PONV Risk Score and Plan: 2 and Ondansetron and Dexamethasone  Airway Management Planned: Oral ETT  Additional Equipment: None  Intra-op Plan:   Post-operative Plan: Extubation in OR  Informed Consent: I have reviewed the patients History and Physical, chart, labs and discussed the procedure including the risks, benefits and alternatives for the proposed  anesthesia with the patient or authorized representative who has indicated his/her understanding and acceptance.     Dental advisory given  Plan Discussed with: CRNA and Surgeon  Anesthesia Plan Comments:        Anesthesia Quick Evaluation

## 2020-12-02 NOTE — H&P (View-Only) (Signed)
° ° ° °  [facility] Progress Note  CC:  Pancreatic mass, dilated PD and CBD, elevated LFTs  Subjective:  She had clear liquids for breakfast then NPO for EUS/ECP later today. She is having mild epigastric pain. No N/V. She passed a soft grayish BM yesterday.   Objective:  Vital signs in last 24 hours: Temp:  [97.9 F (36.6 C)-98.4 F (36.9 C)] 98.3 F (36.8 C) (03/21 0528) Pulse Rate:  [60-67] 67 (03/21 0528) Resp:  [16-18] 16 (03/20 1704) BP: (107-110)/(60-71) 107/60 (03/21 0528) SpO2:  [97 %-100 %] 97 % (03/21 0528)   General:   Alert 40 year old female in NAD. Eyes: Scleral icterus present . Heart: RRR, no murmur.  Pulm:  Breath sounds clear throughout.  Abdomen: Soft, nondistended. Mild epigastric tenderness without rebound or guarding. Positive bowel sounds x 4 quads,  Extremities:  Without edema. Neurologic:  Alert and  oriented x 4. Grossly normal neurologically. Psych:  Alert and cooperative. Normal mood and affect.  Intake/Output from previous day: 03/20 0701 - 03/21 0700 In: 940 [P.O.:940] Out: -  Intake/Output this shift: No intake/output data recorded.  Lab Results: Recent Labs    11/29/20 2054  WBC 6.9  HGB 11.9*  HCT 33.9*  PLT 289   BMET Recent Labs    11/29/20 2054 12/01/20 0300  NA 132* 136  K 4.1 3.5  CL 100 107  CO2 23 22  GLUCOSE 95 112*  BUN <5* <5*  CREATININE 0.58 0.49  CALCIUM 9.1 8.7*   LFT Recent Labs    12/01/20 0300  PROT 5.3*  ALBUMIN 2.5*  AST 227*  ALT 221*  ALKPHOS 353*  BILITOT 6.2*   PT/INR No results for input(s): LABPROT, INR in the last 72 hours. Hepatitis Panel No results for input(s): HEPBSAG, HCVAB, HEPAIGM, HEPBIGM in the last 72 hours.  No results found.  Assessment / Plan:  18. 40 year old female 39 -[redacted] weeks gestation with obstructive jaundice secondary to mass at the head of the pancreas. Abdominal MRI/MRCP 3/15 identified a 2.0 x 1.9 cm mass in the head of the pancreas with PD  dilatation, intra/extra hepatic biliary duct distension with abrupt termination in the pancreatic portion of the CBD.  Ca 19-9 level 481.  LFTS stable. Today Alk phos 353. T. Bili 6.2. AST 277. ALT 221. Heparin SQ on hold.  -EUS +/- ERFCP with Dr.Mansouraty tentatively scheduled at 3pm today. EUS/ERCP benefits and risks discussed including risk with sedation, risk of bleeding, perforation,  infection and pancreatitis. Refer to Dr. Donneta Romberg progress note with extensive discussion with the patient regarding benefits/risks of EUS/ERCP during pregnancy.  -OB consult requested prior to patient proceeding with EUS +/- ERCP -NPO -Continue NS IV @ 150cc/hr for now -Zofran 4 mg IV Q 6 hrs PRN -Further recommendations to be determined after EUS/+/-ERCP completed   2. Mild normocytic anemia. No overt GI bleeding.       Principal Problem:   Pancreatitis Active Problems:   Cigarette smoker   First trimester pregnancy   Obstructive jaundice   Pancreatic mass   History of pulmonary embolus (PE)   Transaminitis     LOS: 2 days   Noralyn Pick  12/02/2020, 8:37 AM

## 2020-12-02 NOTE — Interval H&P Note (Signed)
History and Physical Interval Note:  12/02/2020 2:39 PM  Cynthia [name]  has presented today for surgery, with the diagnosis of obstructive jaundice.  Pancreatic mass.  Dilated pancreatic duct, dilated bile duct..  The various methods of treatment have been discussed with the patient and family. After consideration of risks, benefits and other options for treatment, the patient has consented to  Procedure(s) with comments: ESOPHAGOGASTRODUODENOSCOPY (EGD) (N/A) UPPER ESOPHAGEAL ENDOSCOPIC ULTRASOUND (EUS) (N/A) - Afternoon slot please for Dr. Stefani Dama Roddy he is not available ENDOSCOPIC RETROGRADE CHOLANGIOPANCREATOGRAPHY (ERCP) (N/A) as a surgical intervention.  The patient's history has been reviewed, patient examined, no change in status, stable for surgery.  I have reviewed the patient's chart and labs.  Questions were answered to the patient's satisfaction.     [employer]

## 2020-12-03 DIAGNOSIS — R978 Other abnormal tumor markers: Secondary | ICD-10-CM

## 2020-12-03 DIAGNOSIS — Z9889 Other specified postprocedural states: Secondary | ICD-10-CM

## 2020-12-03 DIAGNOSIS — R17 Unspecified jaundice: Secondary | ICD-10-CM

## 2020-12-03 DIAGNOSIS — R748 Abnormal levels of other serum enzymes: Secondary | ICD-10-CM

## 2020-12-03 LAB — CBC WITH DIFFERENTIAL/PLATELET
Abs Immature Granulocytes: 0.03 10*3/uL (ref 0.00–0.07)
Basophils Absolute: 0.1 10*3/uL (ref 0.0–0.1)
Basophils Relative: 1 %
Eosinophils Absolute: 0 10*3/uL (ref 0.0–0.5)
Eosinophils Relative: 1 %
HCT: 26.8 % — ABNORMAL LOW (ref 36.0–46.0)
Hemoglobin: 9.9 g/dL — ABNORMAL LOW (ref 12.0–15.0)
Immature Granulocytes: 0 %
Lymphocytes Relative: 18 %
Lymphs Abs: 1.4 10*3/uL (ref 0.7–4.0)
MCH: 29.6 pg (ref 26.0–34.0)
MCHC: 36.9 g/dL — ABNORMAL HIGH (ref 30.0–36.0)
MCV: 80 fL (ref 80.0–100.0)
Monocytes Absolute: 0.4 10*3/uL (ref 0.1–1.0)
Monocytes Relative: 5 %
Neutro Abs: 5.9 10*3/uL (ref 1.7–7.7)
Neutrophils Relative %: 75 %
Platelets: 283 10*3/uL (ref 150–400)
RBC: 3.35 MIL/uL — ABNORMAL LOW (ref 3.87–5.11)
RDW: 13.7 % (ref 11.5–15.5)
WBC: 7.9 10*3/uL (ref 4.0–10.5)
nRBC: 0 % (ref 0.0–0.2)

## 2020-12-03 LAB — HEPATIC FUNCTION PANEL
ALT: 232 U/L — ABNORMAL HIGH (ref 0–44)
AST: 302 U/L — ABNORMAL HIGH (ref 15–41)
Albumin: 2.4 g/dL — ABNORMAL LOW (ref 3.5–5.0)
Alkaline Phosphatase: 455 U/L — ABNORMAL HIGH (ref 38–126)
Bilirubin, Direct: 4.9 mg/dL — ABNORMAL HIGH (ref 0.0–0.2)
Indirect Bilirubin: 2 mg/dL — ABNORMAL HIGH (ref 0.3–0.9)
Total Bilirubin: 6.9 mg/dL — ABNORMAL HIGH (ref 0.3–1.2)
Total Protein: 5.2 g/dL — ABNORMAL LOW (ref 6.5–8.1)

## 2020-12-03 LAB — GLUCOSE, CAPILLARY
Glucose-Capillary: 84 mg/dL (ref 70–99)
Glucose-Capillary: 106 mg/dL — ABNORMAL HIGH (ref 70–99)
Glucose-Capillary: 91 mg/dL (ref 70–99)
Glucose-Capillary: 71 mg/dL (ref 70–99)

## 2020-12-03 LAB — LIPASE, BLOOD: Lipase: 486 U/L — ABNORMAL HIGH (ref 11–51)

## 2020-12-03 MED ORDER — HEPARIN SODIUM (PORCINE) 5000 UNIT/ML IJ SOLN
5000.0000 [IU] | Freq: Three times a day (TID) | INTRAMUSCULAR | Status: AC
  Administered 2020-12-03 – 2020-12-04 (×5): 5000 [IU] via SUBCUTANEOUS
  Filled 2020-12-03 (×5): qty 1

## 2020-12-03 MED ORDER — LACTATED RINGERS IV SOLN: INTRAVENOUS | Status: AC

## 2020-12-03 MED ORDER — SODIUM CHLORIDE 0.9% FLUSH
10.0000 mL | INTRAVENOUS | Status: DC | PRN
Start: 2020-12-03 — End: 2020-12-05

## 2020-12-03 MED ORDER — SODIUM CHLORIDE 0.9% FLUSH: 10.0000 mL | Freq: Two times a day (BID) | INTRAVENOUS | Status: DC

## 2020-12-03 NOTE — Progress Notes (Signed)
[facility] Progress Note   CC:  Pancreatic mass, dilated PD and CBD, elevated LFTs  Subjective: She continues to have the same level of epigastric pain, no worse post EUS and ERCP attempt yesterday. No N/V. She is tolerating a clear liquid diet. No BM past 24 hours.   I discussed Dr. Donneta Romberg recommendations post EUS/attempted ERCP with the patient. Dr. Rush Landmark discussed consideration for IR percutaneous biliary drain placement with eventual GI rendezvous vs transfer to [facility] for repeat attempt ERCP. Dr. Rush Landmark spoke to [facility] advanced endoscopist. They may be able to do a day trip for Ms. Kilpatrick on Thursday or Friday for reattempt at ERCP. Ms. Paulhus is agreeable to transfer to [facility] for repeat ERCP.  S/P EGD/EUS 12/02/2020:  Biopsies of pancreatic head mass pending.  EGD Impression: - No gross lesions in esophagus. Z-line irregular, 39 cm from the incisors. - Erythematous mucosa in the gastric body. Biopsied. - J-shaped deformity in the entire stomach. - No gross lesions in the duodenal bulb, in the first portion of the duodenum and in the second portion of the duodenum. - Normal major papilla.  EUS Impression: - A mass-like region was identified in the pancreatic head right where CBD and PD dilation occured. It was isointense rather than typical hypointense on EUS. Cytology results are pending. 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 This was staged T2 N0 Mx by endosonographic criteria. The staging applies if malignancy is confirmed. Fine needle biopsy performed. - There was dilation in the common bile duct and in the common hepatic duct which measured up to 10 mm. - Hyperechoic material consistent with sludge was visualized endosonographically in the common bile duct, in the common hepatic duct and in the gallbladder. - No malignant-appearing lymph nodes were visualized in the celiac  region (level 20), peripancreatic region and porta hepatis region.  S/P ERCP 12/02/2020: ERCP was unsuccessful.  - The major papilla appeared to be flat and angulated. - Failed biliary/pancreatic cannulation.   Objective:  Vital signs in last 24 hours: Temp:  [97.1 F (36.2 C)-99.1 F (37.3 C)] 98.3 F (36.8 C) (03/22 0919) Pulse Rate:  [57-79] 63 (03/22 0919) Resp:  [16-20] 20 (03/22 0919) BP: (103-128)/(57-81) 107/60 (03/22 0919) SpO2:  [95 %-100 %] 97 % (03/22 0919) Weight:  [70.8 kg-77.9 kg] 77.9 kg (03/22 0510)   General:   Alert 40 year old female in NAD.  Heart: RRR, no murmur.  Pulm:  Breath sounds clear throughout.  Abdomen: Soft, epigastric area mildly distended and tender without rebound or guarding. + BS x 4 quads.  Extremities:  Without edema. Neurologic:  Alert and  oriented x4;  grossly normal neurologically. Psych:  Alert and cooperative. Normal mood and affect.  Intake/Output from previous day: 03/21 0701 - 03/22 0700 In: 6659.8 [P.O.:150; I.V.:6409.8; IV Piggyback:100] Out: 0  Intake/Output this shift: No intake/output data recorded.  Lab Results: Recent Labs    12/03/20 0333  WBC 7.9  HGB 9.9*  HCT 26.8*  PLT 283   BMET Recent Labs    12/01/20 0300  NA 136  K 3.5  CL 107  CO2 22  GLUCOSE 112*  BUN <5*  CREATININE 0.49  CALCIUM 8.7*   LFT Recent Labs    12/03/20 0333  PROT 5.2*  ALBUMIN 2.4*  AST 302*  ALT 232*  ALKPHOS 455*  BILITOT 6.9*  BILIDIR 4.9*  IBILI 2.0*   PT/INR No results for input(s): LABPROT, INR in  the last 72 hours. Hepatitis Panel No results for input(s): HEPBSAG, HCVAB, HEPAIGM, HEPBIGM in the last 72 hours.  DG C-Arm 1-60 Min-No Report  Result Date: 12/02/2020 Fluoroscopy was utilized by the requesting physician.  No radiographic interpretation.    Assessment / Plan:  6. 40 year old female 65 -[redacted] weeks gestation with obstructive jaundice secondary to mass at the head of the pancreas. Abdominal  MRI/MRCP 3/15 identified a 2.0 x 1.9 cm mass in the head of the pancreas with PD dilatation, intra/extra hepatic biliary duct distension with abrupt termination in the pancreatic portion of the CBD.  Ca 19-9 level 481.  S/P EUS with biopsies of head of pancreas mass 3/21,  biopsies pending. ERCP attempt 3/21 was unsuccessful.  Post ERCP Lipase level 386 -> 837. Alk phos 353 -> 455. AST 302. ALT 232. T. Bili 6.9. Dr. Rush Landmark discussed consideration for IR percutaneous biliary drain placement with eventual GI rendezvous vs transfer to [facility] for repeat attempt ERCP. Dr. Rush Landmark spoke to [facility] advanced endoscopist. They may be able to do a day trip for Ms. Vick on Thursday or Friday for reattempt at ERCP. Ms. Hamblin is agreeable to transfer to [facility] for repeat ERCP. -Continue LR @ 150cc/hr  -Clear liquid diet -Repeat hepatic panel and lipase in am -Heparin SQ remains on hold post EUS/ERCP, Dr. Rush Landmark to verify timing to restart Heparin SQ vs Lovenox  -Appreciated Dr. Nena Alexander assistance with [facility] transfer/day trip for ERCP  2. Mild normocytic anemia. No overt GI bleeding. Hg 9.9. -CBC in am   Principal Problem:   Pancreatitis Active Problems:   Cigarette smoker   First trimester pregnancy   Obstructive jaundice   Pancreatic mass   History of pulmonary embolus (PE)   Transaminitis     LOS: 3 days   Noralyn Pick  12/03/2020, 11:02 AM

## 2020-12-03 NOTE — TOC Progression Note (Signed)
Transition of Care [facility]) - Progression Note    Patient Details  Name: Cynthia [name] MRN: [id number] Date of Birth: September 04, 1981  Transition of [facility]) CM/SW Contact  Bartholomew Crews, RN Phone Number: [phone] 12/03/2020, 2:49 PM  Clinical Narrative:     Notified by MD of patient needing round trip transport for an 11am appt tomorrow 12/04/2020-ERCP at [facility], [address] [place], [place] [address]. Contact number for charge nurse at clinic is [phone]. Contacted [employer] as required by patient's managed Medicaid. Transportation arrangements made - confirmation# [id number]. Spoke with Lonn Georgia at charge nurse number to advise of transportation arrangements - anticipated readiness for return to [facility] 2:30pm.       Expected Discharge Plan and Services                                                 Social Determinants of Health (SDOH) Interventions    Readmission Risk Interventions No flowsheet data found.

## 2020-12-03 NOTE — Progress Notes (Signed)
I discussed with [facility] transfer center for patient transferring hospital to hospital for inpatient procedure.  Transfer center stated that they do not have any bed available today and do not anticipate next 24 hours.  Patient could possibly have endoscopy procedure tomorrow at [facility].  [facility] endoscopy nurse will coordinate time and destination.  Case discussed with RN case manager who stated that [facility] can be arranged for patient to go to procedure and bring her back.  Since hospital to hospital transfer is going to delay her procedure, biliary stent, we agreed that patient should go for a day trip to [facility] and get the procedure done.  GI Biomedical scientist updated.  Will update patient.

## 2020-12-03 NOTE — Progress Notes (Signed)
Paged provider with lipase [phone]  Provider responded message received.

## 2020-12-03 NOTE — Progress Notes (Signed)
PROGRESS NOTE    Cynthia [name]  ZMO:[id number] DOB: [birth year] DOA: 11/29/2020 PCP: [facility]    Brief Narrative:  40 year old female with history of smoking, remote history of pulmonary embolism, currently [redacted] weeks pregnant who is having abdominal pain for about 7 to 10 days now presents back to the emergency room with ongoing abdominal pain.  Patient started having pain 10 days ago, was admitted to the hospital on 3/14 with pancreatitis, pancreatic head tumor and obstructive jaundice.  She was also found to have [redacted] weeks pregnant.  Patient was a scheduled for ERCP, however she left AMA.  She thought about the options of not treating, now decided to get treatment so came back to the ER.  Followed by GI. Patient underwent EUS and attempted ERCP on 3/21.  EUS successful biopsy of the head of the pancreas mass, ERCP unsuccessful.   Assessment & Plan:   Principal Problem:   Pancreatitis Active Problems:   Cigarette smoker   First trimester pregnancy   Obstructive jaundice   Pancreatic mass   History of pulmonary embolus (PE)   Transaminitis  Obstructive jaundice/abdominal pain/pancreatic head mass with acute pancreatitis: Currently pain-free.  Tolerating diet.  Normal bowel function.  MRCP consistent with pancreatic head mass.  Elevated CA 19-9. ERCP 3/21, unsuccessful to cannulate bile duct and pancreatic duct.   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   Has some generalized itching, using Benadryl. Mild elevated lipase after procedure, patient is fairly asymptomatic.  Will advance to full liquid diet. Patient is in need for a specialized procedure, GI collaborating with endoscopy at [facility] for possible day pass for procedure next few days.  First trimester pregnancy: 12-week pregnant at this time.  Previable. Seen by OB/GYN.  Recommended medical treatment for acute condition.  History of pulmonary embolism: Developed bilateral PE on 05/2015  while on OCP , postpartum 3 months. Hypercoagulable work-up including anticardiolipin antibody, lupus anticoagulant, beta-2 glycoprotein, antithrombin activities normal.  Protein C, protein S normal functional. She was on anticoagulation for 6 months then discontinued.  Currently denies any active bleeding issues. If positive for malignancy, will recommend Lovenox to continue throughout pregnancy on discharge.  Smoker: On nicotine patch.  Counseled.  Ready to quit.   DVT prophylaxis: Place and maintain sequential compression device Start: 12/02/20 1413   Code Status: Full code Family Communication: None.  Patient is talking to her family. Disposition Plan: Status is: Inpatient  Remains inpatient appropriate because:Ongoing diagnostic testing needed not appropriate for outpatient work up   Dispo: The patient is from: Home              Anticipated d/c is to: Home              Patient currently is not medically stable to d/c.   Difficult to place patient No         Consultants:   Gastroenterology  Procedures:   ERCP 3/21  EUS 3/21  Antimicrobials:   None   Subjective: Patient seen and examined.  Denies any nausea vomiting or abdominal pain.  Told her about unable to do ERCP and we are discussing about next level of care, next steps.  Patient is receptive to undergo further treatment or referral.  Objective: Vitals:   12/02/20 2103 12/02/20 2109 12/03/20 0510 12/03/20 0919  BP: (!) 103/59 128/81 120/68 107/60  Pulse: 60 79 65 63  Resp:  18 16 20   Temp: 98.1 F (36.7 C) 99.1  F (37.3 C) 98.7 F (37.1 C) 98.3 F (36.8 C)  TempSrc: Oral Oral Oral Oral  SpO2: 100% 99% 95% 97%  Weight:   77.9 kg   Height:        Intake/Output Summary (Last 24 hours) at 12/03/2020 1110 Last data filed at 12/03/2020 0345 Gross per 24 hour  Intake 6509.8 ml  Output 0 ml  Net 6509.8 ml   Filed Weights   11/29/20 2014 12/02/20 1451 12/03/20 0510  Weight: 76 kg 70.8 kg 77.9 kg     Examination:  General exam: Patient looks fairly comfortable. Respiratory system: Clear to auscultation. Respiratory effort normal. Cardiovascular system: S1 & S2 heard, RRR. No JVD, murmurs, rubs, gallops or clicks. No pedal edema. Gastrointestinal system: Nontender.  Bowel sounds present. Central nervous system: Alert and oriented. No focal neurological deficits. Extremities: Symmetric 5 x 5 power. Skin: No rashes, lesions or ulcers Psychiatry: Judgement and insight appear normal. Mood & affect appropriate.     Data Reviewed: I have personally reviewed following labs and imaging studies  CBC: Recent Labs  Lab 11/27/20 0354 11/28/20 0351 11/29/20 2054 12/03/20 0333  WBC 6.0 5.4 6.9 7.9  NEUTROABS  --   --  4.9 5.9  HGB 11.7* 11.1* 11.9* 9.9*  HCT 34.1* 32.0* 33.9* 26.8*  MCV 84.8 83.8 83.9 80.0  PLT 295 274 289 253   Basic Metabolic Panel: Recent Labs  Lab 11/27/20 0354 11/28/20 0351 11/29/20 2054 12/01/20 0300  NA 138 138 132* 136  K 3.8 3.8 4.1 3.5  CL 107 108 100 107  CO2 23 19* 23 22  GLUCOSE 95 129* 95 112*  BUN <5* 5* <5* <5*  CREATININE 0.45 0.39* 0.58 0.49  CALCIUM 9.3 9.1 9.1 8.7*   GFR: Estimated Creatinine Clearance: 99.4 mL/min (by C-G formula based on SCr of 0.49 mg/dL). Liver Function Tests: Recent Labs  Lab 11/27/20 0354 11/28/20 0351 11/29/20 2054 12/01/20 0300 12/03/20 0333  AST 193* 259* 235* 227* 302*  ALT 201* 223* 246* 221* 232*  ALKPHOS 365* 327* 373* 353* 455*  BILITOT 6.0* 5.4* 7.2* 6.2* 6.9*  PROT 6.8 6.2* 6.5 5.3* 5.2*  ALBUMIN 3.4* 3.0* 3.1* 2.5* 2.4*   Recent Labs  Lab 11/27/20 0354 11/29/20 2054 12/03/20 0333  LIPASE 122* 386* 837*   No results for input(s): AMMONIA in the last 168 hours. Coagulation Profile: Recent Labs  Lab 11/28/20 0351  INR 0.9   Cardiac Enzymes: No results for input(s): CKTOTAL, CKMB, CKMBINDEX, TROPONINI in the last 168 hours. BNP (last 3 results) No results for input(s): PROBNP  in the last 8760 hours. HbA1C: No results for input(s): HGBA1C in the last 72 hours. CBG: Recent Labs  Lab 11/27/20 2315 11/28/20 0359 11/28/20 0722 11/28/20 1133 12/03/20 0817  GLUCAP 113* 139* 103* 102* 84   Lipid Profile: No results for input(s): CHOL, HDL, LDLCALC, TRIG, CHOLHDL, LDLDIRECT in the last 72 hours. Thyroid Function Tests: No results for input(s): TSH, T4TOTAL, FREET4, T3FREE, THYROIDAB in the last 72 hours. Anemia Panel: No results for input(s): VITAMINB12, FOLATE, FERRITIN, TIBC, IRON, RETICCTPCT in the last 72 hours. Sepsis Labs: No results for input(s): PROCALCITON, LATICACIDVEN in the last 168 hours.  Recent Results (from the past 240 hour(s))  Resp Panel by RT-PCR (Flu A&B, Covid) Nasopharyngeal Swab     Status: None   Collection Time: 11/25/20  8:51 AM   Specimen: Nasopharyngeal Swab; Nasopharyngeal(NP) swabs in vial transport medium  Result Value Ref Range Status   SARS Coronavirus 2  by RT PCR NEGATIVE NEGATIVE Final    Comment: (NOTE) SARS-CoV-2 target nucleic acids are NOT DETECTED.  The SARS-CoV-2 RNA is generally detectable in upper respiratory specimens during the acute phase of infection. The lowest concentration of SARS-CoV-2 viral copies this assay can detect is 138 copies/mL. A negative result does not preclude SARS-Cov-2 infection and should not be used as the sole basis for treatment or other patient management decisions. A negative result may occur with  improper specimen collection/handling, submission of specimen other than nasopharyngeal swab, presence of viral mutation(s) within the areas targeted by this assay, and inadequate number of viral copies(<138 copies/mL). A negative result must be combined with clinical observations, patient history, and epidemiological information. The expected result is Negative.  Fact Sheet for Patients:  EntrepreneurPulse.com.au  Fact Sheet for Healthcare Providers:   IncredibleEmployment.be  This test is no t yet approved or cleared by the [place] FDA and  has been authorized for detection and/or diagnosis of SARS-CoV-2 by FDA under an Emergency Use Authorization (EUA). This EUA will remain  in effect (meaning this test can be used) for the duration of the COVID-19 declaration under Section 564(b)(1) of the Act, 21 U.S.C.section 360bbb-3(b)(1), unless the authorization is terminated  or revoked sooner.       Influenza A by PCR NEGATIVE NEGATIVE Final   Influenza B by PCR NEGATIVE NEGATIVE Final    Comment: (NOTE) The Xpert Xpress SARS-CoV-2/FLU/RSV plus assay is intended as an aid in the diagnosis of influenza from Nasopharyngeal swab specimens and should not be used as a sole basis for treatment. Nasal washings and aspirates are unacceptable for Xpert Xpress SARS-CoV-2/FLU/RSV testing.  Fact Sheet for Patients: EntrepreneurPulse.com.au  Fact Sheet for Healthcare Providers: IncredibleEmployment.be  This test is not yet approved or cleared by the [place] FDA and has been authorized for detection and/or diagnosis of SARS-CoV-2 by FDA under an Emergency Use Authorization (EUA). This EUA will remain in effect (meaning this test can be used) for the duration of the COVID-19 declaration under Section 564(b)(1) of the Act, 21 U.S.C. section 360bbb-3(b)(1), unless the authorization is terminated or revoked.  Performed at [facility], [place] [address] [place], [place] [address]   Resp Panel by RT-PCR (Flu A&B, Covid) Nasopharyngeal Swab     Status: None   Collection Time: 11/29/20  8:30 PM   Specimen: Nasopharyngeal Swab; Nasopharyngeal(NP) swabs in vial transport medium  Result Value Ref Range Status   SARS Coronavirus 2 by RT PCR NEGATIVE NEGATIVE Final    Comment: (NOTE) SARS-CoV-2 target nucleic acids are NOT DETECTED.  The SARS-CoV-2 RNA is generally  detectable in upper respiratory specimens during the acute phase of infection. The lowest concentration of SARS-CoV-2 viral copies this assay can detect is 138 copies/mL. A negative result does not preclude SARS-Cov-2 infection and should not be used as the sole basis for treatment or other patient management decisions. A negative result may occur with  improper specimen collection/handling, submission of specimen other than nasopharyngeal swab, presence of viral mutation(s) within the areas targeted by this assay, and inadequate number of viral copies(<138 copies/mL). A negative result must be combined with clinical observations, patient history, and epidemiological information. The expected result is Negative.  Fact Sheet for Patients:  EntrepreneurPulse.com.au  Fact Sheet for Healthcare Providers:  IncredibleEmployment.be  This test is no t yet approved or cleared by the [place] FDA and  has been authorized for detection and/or diagnosis of SARS-CoV-2 by FDA under an Emergency  Use Authorization (EUA). This EUA will remain  in effect (meaning this test can be used) for the duration of the COVID-19 declaration under Section 564(b)(1) of the Act, 21 U.S.C.section 360bbb-3(b)(1), unless the authorization is terminated  or revoked sooner.       Influenza A by PCR NEGATIVE NEGATIVE Final   Influenza B by PCR NEGATIVE NEGATIVE Final    Comment: (NOTE) The Xpert Xpress SARS-CoV-2/FLU/RSV plus assay is intended as an aid in the diagnosis of influenza from Nasopharyngeal swab specimens and should not be used as a sole basis for treatment. Nasal washings and aspirates are unacceptable for Xpert Xpress SARS-CoV-2/FLU/RSV testing.  Fact Sheet for Patients: EntrepreneurPulse.com.au  Fact Sheet for Healthcare Providers: IncredibleEmployment.be  This test is not yet approved or cleared by the [place] FDA  and has been authorized for detection and/or diagnosis of SARS-CoV-2 by FDA under an Emergency Use Authorization (EUA). This EUA will remain in effect (meaning this test can be used) for the duration of the COVID-19 declaration under Section 564(b)(1) of the Act, 21 U.S.C. section 360bbb-3(b)(1), unless the authorization is terminated or revoked.  Performed at [facility], [place] [address], [place], [place] [address]          Radiology Studies: DG C-Arm 1-60 Min-No Report  Result Date: 12/02/2020 Fluoroscopy was utilized by the requesting physician.  No radiographic interpretation.        Scheduled Meds: . nicotine  14 mg Transdermal Daily  . sodium chloride flush  10-40 mL Intracatheter Q12H  . sodium chloride flush  3 mL Intravenous Q12H   Continuous Infusions: . lactated ringers 150 mL/hr at 12/03/20 1054     LOS: 3 days    Time spent: 30 minutes    Barb Merino, MD [facility] Pager [phone]

## 2020-12-04 DIAGNOSIS — K859 Acute pancreatitis without necrosis or infection, unspecified: Secondary | ICD-10-CM

## 2020-12-04 HISTORY — DX: Acute pancreatitis without necrosis or infection, unspecified: K85.90

## 2020-12-04 LAB — BASIC METABOLIC PANEL
Anion gap: 7 (ref 5–15)
BUN: <5 mg/dL — ABNORMAL LOW (ref 6–20)
CO2: 23 mmol/L (ref 22–32)
Calcium: 8.6 mg/dL — ABNORMAL LOW (ref 8.9–10.3)
Chloride: 106 mmol/L (ref 98–111)
Creatinine, Ser: 0.52 mg/dL (ref 0.44–1.00)
GFR, Estimated: >60 mL/min (ref >60)
Glucose, Bld: 90 mg/dL (ref 70–99)
Potassium: 3.2 mmol/L — ABNORMAL LOW (ref 3.5–5.1)
Sodium: 136 mmol/L (ref 135–145)

## 2020-12-04 LAB — CBC
HCT: 26 % — ABNORMAL LOW (ref 36.0–46.0)
Hemoglobin: 9.5 g/dL — ABNORMAL LOW (ref 12.0–15.0)
MCH: 28.8 pg (ref 26.0–34.0)
MCHC: 36.5 g/dL — ABNORMAL HIGH (ref 30.0–36.0)
MCV: 78.8 fL — ABNORMAL LOW (ref 80.0–100.0)
Platelets: 296 10*3/uL (ref 150–400)
RBC: 3.3 MIL/uL — ABNORMAL LOW (ref 3.87–5.11)
RDW: 13.7 % (ref 11.5–15.5)
WBC: 5.3 10*3/uL (ref 4.0–10.5)
nRBC: 0 % (ref 0.0–0.2)

## 2020-12-04 LAB — HEPATIC FUNCTION PANEL
ALT: 244 U/L — ABNORMAL HIGH (ref 0–44)
AST: 278 U/L — ABNORMAL HIGH (ref 15–41)
Albumin: 2.3 g/dL — ABNORMAL LOW (ref 3.5–5.0)
Alkaline Phosphatase: 506 U/L — ABNORMAL HIGH (ref 38–126)
Bilirubin, Direct: 4.9 mg/dL — ABNORMAL HIGH (ref 0.0–0.2)
Indirect Bilirubin: 2.5 mg/dL — ABNORMAL HIGH (ref 0.3–0.9)
Total Bilirubin: 7.4 mg/dL — ABNORMAL HIGH (ref 0.3–1.2)
Total Protein: 5.1 g/dL — ABNORMAL LOW (ref 6.5–8.1)

## 2020-12-04 LAB — LIPASE, BLOOD: Lipase: 326 U/L — ABNORMAL HIGH (ref 11–51)

## 2020-12-04 LAB — SURGICAL PATHOLOGY

## 2020-12-04 LAB — GLUCOSE, CAPILLARY: Glucose-Capillary: 83 mg/dL (ref 70–99)

## 2020-12-04 MED ORDER — POTASSIUM CHLORIDE 10 MEQ/100ML IV SOLN
10.0000 meq | INTRAVENOUS | Status: AC
  Administered 2020-12-04 (×3): 10 meq via INTRAVENOUS
  Filled 2020-12-04 (×3): qty 100

## 2020-12-04 MED ORDER — POTASSIUM CHLORIDE CRYS ER 20 MEQ PO TBCR
20.0000 meq | EXTENDED_RELEASE_TABLET | Freq: Once | ORAL | Status: AC
  Administered 2020-12-04: 20 meq via ORAL
  Filled 2020-12-04: qty 1

## 2020-12-04 NOTE — Progress Notes (Addendum)
          Daily Rounding Note  12/04/2020, 8:33 AM  LOS: 4 days   SUBJECTIVE:   Chief complaint:   Obstructing pancreatic mass. Patient complaining of sore throat.  Appetite is good though she is n.p.o. to allow for endoscopic procedures later today.  No significant abdominal pain.  OBJECTIVE:         Vital signs in last 24 hours:    Temp:  [97.4 F (36.3 C)-98.4 F (36.9 C)] 98.4 F (36.9 C) (03/23 0457) Pulse Rate:  [58-73] 73 (03/23 0457) Resp:  [16-20] 18 (03/23 0457) BP: (107-125)/(59-83) 119/64 (03/23 0457) SpO2:  [94 %-100 %] 94 % (03/23 0457) Last BM Date: 12/01/20 Filed Weights   11/29/20 2014 12/02/20 1451 12/03/20 0510  Weight: 76 kg 70.8 kg 77.9 kg   General: Patient alert, comfortable, does not look acutely ill. Did not reexamine patient though I spoke with her in the room.  Intake/Output from previous day: 03/22 0701 - 03/23 0700 In: 1639.9 [P.O.:1180; I.V.:459.9] Out: 0   Intake/Output this shift: No intake/output data recorded.  Lab Results: Recent Labs    12/03/20 0333 12/04/20 0624  WBC 7.9 5.3  HGB 9.9* 9.5*  HCT 26.8* 26.0*  PLT 283 296   BMET Recent Labs    12/04/20 0624  NA 136  K 3.2*  CL 106  CO2 23  GLUCOSE 90  BUN <5*  CREATININE 0.52  CALCIUM 8.6*   LFT Recent Labs    12/03/20 0333 12/04/20 0624  PROT 5.2* 5.1*  ALBUMIN 2.4* 2.3*  AST 302* 278*  ALT 232* 244*  ALKPHOS 455* 506*  BILITOT 6.9* 7.4*  BILIDIR 4.9* 4.9*  IBILI 2.0* 2.5*   PT/INR No results for input(s): LABPROT, INR in the last 72 hours. Hepatitis Panel No results for input(s): HEPBSAG, HCVAB, HEPAIGM, HEPBIGM in the last 72 hours.  Studies/Results: DG C-Arm 1-60 Min-No Report  Result Date: 12/02/2020 Fluoroscopy was utilized by the requesting physician.  No radiographic interpretation.    ASSESMENT:   *   Obstructing pancreatic mass.  Elevated CA 19-9 at 491.  Unsuccessful ERCP 12/02/20,  unable to cannulate biliary and pancreatic duct.  EUS: T2N0Mx.  FNA: pndg cytology.   LFTs remain elevated.    *   Week 13 pregnancy  *   Hypokalemia.      PLAN   *   Day trip to [place] today for re-attempt ERCP/EUS,stenting.  *   Dr Posey Pronto ordering potassium.      Azucena Freed  12/04/2020, 8:33 AM Phone [phone]

## 2020-12-04 NOTE — Progress Notes (Addendum)
[facility] transfer summary   Patient: Cynthia [name] BWG:[id number]  PCP: [facility]  Date of admission: 11/29/2020   Date of transfer:  12/04/2020     Patient currently being transferred from [facility] clinic for ERCP procedure for advanced endoscopy.  Anticipating patient will be transported back to [facility] after successful procedure.  Active diagnoses:  Principal Problem:   Pancreatitis Active Problems:   Cigarette smoker   First trimester pregnancy   Obstructive jaundice   Pancreatic mass   History of pulmonary embolus (PE)   Transaminitis  Admitted From: Home Disposition:  Outside Hospital transfer to [facility] from [facility]  Recommendations for Outpatient Follow-up:  1. PCP: [facility]  2. Gastroenterology Dr. Rush Landmark  Current diet: Full liquid diet n.p.o. since midnight  Activity: as tolerated  Transfer condition: stable  Code Status: Full code   History of present illness: As per the H and P dictated on admission, "TRISTINE LANGI is a 40 y.o. female with medical history significant of tobacco abuse, history of PE, and [redacted] weeks pregnant presents with complaints of abdominal pain over the last 1-1/2 weeks.  Patient had just recently been hospitalized from 3/14-3/17 with complaints of right upper abdominal pain and was admitted for suspected gallstone pancreatitis.  During hospitalization patient was worked up with MRCP which noted concern for pancreatic mass with obstructive jaundice.  Plan was for patient undergo ERCP, but she left AGAINST MEDICAL ADVICE due to the not given any food.  Labs had noted that patient CA 19-9 was elevated up to 481 giving concern for the possibility of underlying malignancy.  She presents back to the hospital due to persistence of abdominal pain.  Complains of having achy epigastric abdominal pain that is worse on the left upper quadrant at this time.   Diarrhea symptoms have resolved.  Denies having any nausea, vomiting, shortness of breath, or chest pain.  ED Course: Upon admission into the emergency department patient was seen to be afebrile vital signs relatively within normal limits.  Labs significant for hemoglobin 11.9, sodium 132, alkaline phosphatase 373, lipase 386, AST 235, ALT 246, and total bilirubin 7.2.  [facility] had been formally consulted."  Hospital Course:  Summary of her active problems in the hospital is as following.   Obstructive jaundice Abdominal pain Pancreatic head mass with acute pancreatitis: CA 19-9 491 Currently pain-free.  Tolerating full diet. Normal bowel function.  MRCP consistent with pancreatic head mass. Failed ERCP 3/21, unsuccessful to cannulate bile duct and pancreatic duct. 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   Has some generalized itching, using Benadryl. Mild elevated lipase after procedure, already trending down. Patient is fairly asymptomatic.  Patient is in need for a specialized procedure,  documents thoroughly discussed with IR and advanced endoscopist. Highly appreciate  GI as well as [facility] assistance in managing this patient. Hepatic Function Latest Ref Rng & Units 12/04/2020 12/03/2020 12/01/2020  Total Protein 6.5 - 8.1 g/dL 5.1(L) 5.2(L) 5.3(L)  Albumin 3.5 - 5.0 g/dL 2.3(L) 2.4(L) 2.5(L)  AST 15 - 41 U/L 278(H) 302(H) 227(H)  ALT 0 - 44 U/L 244(H) 232(H) 221(H)  Alk Phosphatase 38 - 126 U/L 506(H) 455(H) 353(H)  Total Bilirubin 0.3 - 1.2 mg/dL 7.4(H) 6.9(H) 6.2(H)  Bilirubin, Direct 0.0 - 0.2 mg/dL 4.9(H) 4.9(H) -   Lipase     Component Value Date/Time   LIPASE 326 (H) 12/04/2020 1779  First trimester pregnancy:  12-week pregnant at this time. HCG on 03/14 415830.  US abdomen on 03/14: Single live intrauterine gestation of 11 weeks 1 day. Seen by OB/GYN. Recommended medical treatment for acute condition.  History of  pulmonary embolism: Developed bilateral PE on 05/2015 while on OCP , postpartum 3 months. Hypercoagulable work-up including anticardiolipin antibody, lupus anticoagulant, beta-2 glycoprotein, antithrombin activities normal.  Protein C, protein S normal functional. She was on anticoagulation for 6 months then discontinued.  Currently denies any active bleeding issues. If positive for malignancy, will recommend Lovenox to continue throughout pregnancy on discharge.  Smoker:  On nicotine patch.  Counseled.  Ready to quit.  Mild hypokalemia  Replacing with IV potassium. Will recheck tomorrow.   Anemia Hb 9.5 stable. Dropped from 11 on admission likely due to dilution.  No bleeding so far.  On heparin for DVT Px.  Will check anemia work up.    On the day of the transfer the patient's vitals were stable, and no other acute medical condition were reported by patient. The patient was felt safe to be transferred to [facility] clinic for ERCP.  Procedures: ERCP with EUS 3/21 Impression: transfer to [facility] for further workup/management. Awaiting EUS cytology.  Current MEDICATION: . heparin injection (subcutaneous)  5,000 Units Subcutaneous Q8H  . nicotine  14 mg Transdermal Daily  . sodium chloride flush  10-40 mL Intracatheter Q12H  . sodium chloride flush  3 mL Intravenous Q12H   . lactated ringers 150 mL/hr at 12/04/20 0720  . potassium chloride 10 mEq (12/04/20 0958)    albuterol, diphenhydrAMINE, fentaNYL (SUBLIMAZE) injection, hydrALAZINE, ondansetron **OR** ondansetron (ZOFRAN) IV, sodium chloride flush   Medications Prior to Admission  Medication Sig Dispense Refill  . naproxen (NAPROSYN) 500 MG tablet Take 1 tablet (500 mg total) by mouth 2 (two) times daily as needed for moderate pain. 15 tablet 0  . methocarbamol (ROBAXIN) 500 MG tablet Take 1 tablet (500 mg total) by mouth every 8 (eight) hours as needed for muscle spasms. (Patient not taking: No sig reported) 15 tablet 0     Exam: Filed Weights   11/29/20 2014 12/02/20 1451 12/03/20 0510  Weight: 76 kg 70.8 kg 77.9 kg   Vitals:   12/04/20 0457 12/04/20 0926  BP: 119/64 123/74  Pulse: 73 66  Resp: 18 17  Temp: 98.4 F (36.9 C) 98.7 F (37.1 C)  SpO2: 94% 95%   General: Appear in no distress, no Rash; Oral Mucosa Clear, moist. no Abnormal Neck Mass Or lumps, Conjunctiva normal  Cardiovascular: S1 and S2 Present, no Murmur Respiratory: good respiratory effort, Bilateral Air entry present and CTA, no Crackles, no wheezes Abdomen: Bowel Sound present, Soft and mild tenderness Extremities: no Pedal edema Neurology: alert and oriented to time, place, and person affect appropriate. no new focal deficit  The results of significant diagnostics from this hospitalization (including imaging, microbiology, ancillary and laboratory) are listed below for reference.    Significant Diagnostic Studies: US OB Comp < 14 Wks  Result Date: 11/25/2020 CLINICAL DATA:  Pregnancy. EXAM: OBSTETRIC <14 WK ULTRASOUND TECHNIQUE: Transabdominal ultrasound was performed for evaluation of the gestation as well as the maternal uterus and adnexal regions. COMPARISON:  None. FINDINGS: Intrauterine gestational sac: Single Yolk sac:  Visualized. Embryo:  Visualized. Cardiac Activity: Visualized. Heart Rate: 158 bpm CRL:   43.3 mm   11 w 1 d                  [place]  EDC: June 15, 2021. Subchorionic hemorrhage:  None visualized. Maternal uterus/adnexae: Ovaries are unremarkable. No free fluid is noted. IMPRESSION: Single live intrauterine gestation of 11 weeks 1 day. Electronically Signed   By: Marijo Conception M.D.   On: 11/25/2020 09:54   MR ABDOMEN MRCP WO CONTRAST  Addendum Date: 11/26/2020   ADDENDUM REPORT: 11/26/2020 14:18 ADDENDUM: These results were called by telephone at the time of interpretation on 11/26/2020 at 2:17 pm to provider [facility] , who verbally acknowledged these results. Other findings relate by the radiology  assistant as outlined in the Z vision dash board. Electronically Signed   By: Zetta Bills M.D.   On: 11/26/2020 14:18   Result Date: 11/26/2020 CLINICAL DATA:  RIGHT upper quadrant abdominal pain. Nondiagnostic ultrasound. EXAM: MRI ABDOMEN WITHOUT CONTRAST  (INCLUDING MRCP) TECHNIQUE: Multiplanar multisequence MR imaging of the abdomen was performed. Heavily T2-weighted images of the biliary and pancreatic ducts were obtained, and three-dimensional MRCP images were rendered by post processing. COMPARISON:  Ultrasound evaluation of the same date showing dilated appearance of the biliary tree. FINDINGS: Lower chest: No consolidation. No pleural effusion noted at the lung bases. Hepatobiliary: Diffuse intra and extrahepatic biliary duct distension with abrupt termination in the pancreatic portion of the common bile duct. No pericholecystic fluid. Wall thickness approximately 3 mm. No filling defect in the common bile duct to suggest choledocholithiasis. Sludge in the gallbladder lumen. Low insertion of posterior division RIGHT hepatic ducts upon the biliary tree seen passing into the hepatic duct approximately 8 mm above the cystic duct. Pancreas: Irregular pancreatic ductal distension. Loss of normal intrinsic T1 signal focally in the pancreatic head with masslike appearance, this corresponds to intermediate T2 signal. No area of restricted diffusion in this location. This area measures 2.0 x 1.9 cm.  Mild peripancreatic stranding. Spleen:  Normal size and contour.  No focal splenic lesion. Adrenals/Urinary Tract:  Adrenal glands are normal.  The No sign of hydronephrosis.  Smooth renal contours. Stomach/Bowel: No acute gastrointestinal process to the extent evaluated. Vascular/Lymphatic: Limited assessment on noncontrast imaging. No adenopathy. No signs of aneurysmal dilation of the abdominal aorta. Other:  No ascites. Musculoskeletal: No suspicious bone lesions identified. IMPRESSION: 1. Double duct sign with  masslike area in the head of the pancreas, suspicious for pancreatic neoplasm. It is possible that if there is a history of repeated pancreatitis is could represent sequela of groove pancreatitis and stricture though the abrupt termination has a more malignant rather than benign appearance. CT with intravenous contrast of the abdomen only may be helpful for further evaluation to exclude the possibility of pancreatic mass and guide further evaluation such as endoscopic ultrasound or ERCP. 2. Pancreatic ductal distension and mild relative atrophy. 3. Mild gallbladder wall thickening with mild diffusion abnormality associated with the gallbladder wall. Findings are nonspecific and could even be related to chronic cholecystitis in the setting of biliary obstruction, equivocal for acute biliary pathology. Contrasted imaging could also be helpful to assess for signs of acute inflammation 4. Mild peripancreatic stranding may be related to an overlay of acute mild interstitial edematous pancreatitis. 5. Biliary ductal variant with posterior RIGHT hepatic ducts draining just above the cystic duct confluence with the common bile duct. 6. Sludge in the gallbladder lumen. 7. Mild peripancreatic stranding. A call is out to the referring provider to further discuss findings in the above case. Electronically Signed: By: Zetta Bills M.D. On: 11/26/2020 13:53   MR 3D Recon At Scanner  Addendum Date: 11/26/2020  ADDENDUM REPORT: 11/26/2020 14:18 ADDENDUM: These results were called by telephone at the time of interpretation on 11/26/2020 at 2:17 pm to provider [facility] , who verbally acknowledged these results. Other findings relate by the radiology assistant as outlined in the Z vision dash board. Electronically Signed   By: Zetta Bills M.D.   On: 11/26/2020 14:18   Result Date: 11/26/2020 CLINICAL DATA:  RIGHT upper quadrant abdominal pain. Nondiagnostic ultrasound. EXAM: MRI ABDOMEN WITHOUT CONTRAST  (INCLUDING  MRCP) TECHNIQUE: Multiplanar multisequence MR imaging of the abdomen was performed. Heavily T2-weighted images of the biliary and pancreatic ducts were obtained, and three-dimensional MRCP images were rendered by post processing. COMPARISON:  Ultrasound evaluation of the same date showing dilated appearance of the biliary tree. FINDINGS: Lower chest: No consolidation. No pleural effusion noted at the lung bases. Hepatobiliary: Diffuse intra and extrahepatic biliary duct distension with abrupt termination in the pancreatic portion of the common bile duct. No pericholecystic fluid. Wall thickness approximately 3 mm. No filling defect in the common bile duct to suggest choledocholithiasis. Sludge in the gallbladder lumen. Low insertion of posterior division RIGHT hepatic ducts upon the biliary tree seen passing into the hepatic duct approximately 8 mm above the cystic duct. Pancreas: Irregular pancreatic ductal distension. Loss of normal intrinsic T1 signal focally in the pancreatic head with masslike appearance, this corresponds to intermediate T2 signal. No area of restricted diffusion in this location. This area measures 2.0 x 1.9 cm.  Mild peripancreatic stranding. Spleen:  Normal size and contour.  No focal splenic lesion. Adrenals/Urinary Tract:  Adrenal glands are normal.  The No sign of hydronephrosis.  Smooth renal contours. Stomach/Bowel: No acute gastrointestinal process to the extent evaluated. Vascular/Lymphatic: Limited assessment on noncontrast imaging. No adenopathy. No signs of aneurysmal dilation of the abdominal aorta. Other:  No ascites. Musculoskeletal: No suspicious bone lesions identified. IMPRESSION: 1. Double duct sign with masslike area in the head of the pancreas, suspicious for pancreatic neoplasm. It is possible that if there is a history of repeated pancreatitis is could represent sequela of groove pancreatitis and stricture though the abrupt termination has a more malignant rather than  benign appearance. CT with intravenous contrast of the abdomen only may be helpful for further evaluation to exclude the possibility of pancreatic mass and guide further evaluation such as endoscopic ultrasound or ERCP. 2. Pancreatic ductal distension and mild relative atrophy. 3. Mild gallbladder wall thickening with mild diffusion abnormality associated with the gallbladder wall. Findings are nonspecific and could even be related to chronic cholecystitis in the setting of biliary obstruction, equivocal for acute biliary pathology. Contrasted imaging could also be helpful to assess for signs of acute inflammation 4. Mild peripancreatic stranding may be related to an overlay of acute mild interstitial edematous pancreatitis. 5. Biliary ductal variant with posterior RIGHT hepatic ducts draining just above the cystic duct confluence with the common bile duct. 6. Sludge in the gallbladder lumen. 7. Mild peripancreatic stranding. A call is out to the referring provider to further discuss findings in the above case. Electronically Signed: By: Zetta Bills M.D. On: 11/26/2020 13:53   DG C-Arm 1-60 Min-No Report  Result Date: 12/02/2020 Fluoroscopy was utilized by the requesting physician.  No radiographic interpretation.   US Abdomen Limited RUQ (LIVER/GB)  Result Date: 11/25/2020 CLINICAL DATA:  40 year old female with right upper quadrant pain for several weeks. EXAM: ULTRASOUND ABDOMEN LIMITED RIGHT UPPER QUADRANT COMPARISON:  CTA chest 05/25/2015. FINDINGS: Gallbladder: Gallbladder lumen appears filled with echogenic sludge, possibly  with punctate floating stones (image 10). Wall thickness remains normal. No pericholecystic fluid. Equivocal sonographic Murphy sign. Common bile duct: Diameter: Up to 12 mm, dilated. No filling defect identified in the visible duct (image 21). Liver: I do suspect a degree of intrahepatic biliary ductal dilatation. No discrete liver lesion identified. Liver echogenicity at the  upper limits of normal. Portal vein is patent on color Doppler imaging with normal direction of blood flow towards the liver. Other: Negative visible right kidney.  No free fluid. IMPRESSION: Gallbladder distended with sludge, possibly with floating punctate stones. No gallbladder wall thickening but dilated common bile duct and suspicion of early intrahepatic biliary ductal dilatation. This constellation suggests Choledocholithiasis. Recommend further evaluation. Electronically Signed   By: Genevie Ann M.D.   On: 11/25/2020 07:54    Microbiology: Recent Results (from the past 240 hour(s))  Resp Panel by RT-PCR (Flu A&B, Covid) Nasopharyngeal Swab     Status: None   Collection Time: 11/25/20  8:51 AM   Specimen: Nasopharyngeal Swab; Nasopharyngeal(NP) swabs in vial transport medium  Result Value Ref Range Status   SARS Coronavirus 2 by RT PCR NEGATIVE NEGATIVE Final    Comment: (NOTE) SARS-CoV-2 target nucleic acids are NOT DETECTED.  The SARS-CoV-2 RNA is generally detectable in upper respiratory specimens during the acute phase of infection. The lowest concentration of SARS-CoV-2 viral copies this assay can detect is 138 copies/mL. A negative result does not preclude SARS-Cov-2 infection and should not be used as the sole basis for treatment or other patient management decisions. A negative result may occur with  improper specimen collection/handling, submission of specimen other than nasopharyngeal swab, presence of viral mutation(s) within the areas targeted by this assay, and inadequate number of viral copies(<138 copies/mL). A negative result must be combined with clinical observations, patient history, and epidemiological information. The expected result is Negative.  Fact Sheet for Patients:  EntrepreneurPulse.com.au  Fact Sheet for Healthcare Providers:  IncredibleEmployment.be  This test is no t yet approved or cleared by the [place] FDA  and  has been authorized for detection and/or diagnosis of SARS-CoV-2 by FDA under an Emergency Use Authorization (EUA). This EUA will remain  in effect (meaning this test can be used) for the duration of the COVID-19 declaration under Section 564(b)(1) of the Act, 21 U.S.C.section 360bbb-3(b)(1), unless the authorization is terminated  or revoked sooner.       Influenza A by PCR NEGATIVE NEGATIVE Final   Influenza B by PCR NEGATIVE NEGATIVE Final    Comment: (NOTE) The Xpert Xpress SARS-CoV-2/FLU/RSV plus assay is intended as an aid in the diagnosis of influenza from Nasopharyngeal swab specimens and should not be used as a sole basis for treatment. Nasal washings and aspirates are unacceptable for Xpert Xpress SARS-CoV-2/FLU/RSV testing.  Fact Sheet for Patients: EntrepreneurPulse.com.au  Fact Sheet for Healthcare Providers: IncredibleEmployment.be  This test is not yet approved or cleared by the [place] FDA and has been authorized for detection and/or diagnosis of SARS-CoV-2 by FDA under an Emergency Use Authorization (EUA). This EUA will remain in effect (meaning this test can be used) for the duration of the COVID-19 declaration under Section 564(b)(1) of the Act, 21 U.S.C. section 360bbb-3(b)(1), unless the authorization is terminated or revoked.  Performed at [facility], [place] [address] [place], [place] [address]   Resp Panel by RT-PCR (Flu A&B, Covid) Nasopharyngeal Swab     Status: None   Collection Time: 11/29/20  8:30 PM   Specimen: Nasopharyngeal  Swab; Nasopharyngeal(NP) swabs in vial transport medium  Result Value Ref Range Status   SARS Coronavirus 2 by RT PCR NEGATIVE NEGATIVE Final    Comment: (NOTE) SARS-CoV-2 target nucleic acids are NOT DETECTED.  The SARS-CoV-2 RNA is generally detectable in upper respiratory specimens during the acute phase of infection. The lowest concentration of  SARS-CoV-2 viral copies this assay can detect is 138 copies/mL. A negative result does not preclude SARS-Cov-2 infection and should not be used as the sole basis for treatment or other patient management decisions. A negative result may occur with  improper specimen collection/handling, submission of specimen other than nasopharyngeal swab, presence of viral mutation(s) within the areas targeted by this assay, and inadequate number of viral copies(<138 copies/mL). A negative result must be combined with clinical observations, patient history, and epidemiological information. The expected result is Negative.  Fact Sheet for Patients:  EntrepreneurPulse.com.au  Fact Sheet for Healthcare Providers:  IncredibleEmployment.be  This test is no t yet approved or cleared by the [place] FDA and  has been authorized for detection and/or diagnosis of SARS-CoV-2 by FDA under an Emergency Use Authorization (EUA). This EUA will remain  in effect (meaning this test can be used) for the duration of the COVID-19 declaration under Section 564(b)(1) of the Act, 21 U.S.C.section 360bbb-3(b)(1), unless the authorization is terminated  or revoked sooner.       Influenza A by PCR NEGATIVE NEGATIVE Final   Influenza B by PCR NEGATIVE NEGATIVE Final    Comment: (NOTE) The Xpert Xpress SARS-CoV-2/FLU/RSV plus assay is intended as an aid in the diagnosis of influenza from Nasopharyngeal swab specimens and should not be used as a sole basis for treatment. Nasal washings and aspirates are unacceptable for Xpert Xpress SARS-CoV-2/FLU/RSV testing.  Fact Sheet for Patients: EntrepreneurPulse.com.au  Fact Sheet for Healthcare Providers: IncredibleEmployment.be  This test is not yet approved or cleared by the [place] FDA and has been authorized for detection and/or diagnosis of SARS-CoV-2 by FDA under an Emergency Use  Authorization (EUA). This EUA will remain in effect (meaning this test can be used) for the duration of the COVID-19 declaration under Section 564(b)(1) of the Act, 21 U.S.C. section 360bbb-3(b)(1), unless the authorization is terminated or revoked.  Performed at [facility], [place] [address] [place], [place] [address]      Labs: CBC: Recent Labs  Lab 11/28/20 0351 11/29/20 2054 12/03/20 0333 12/04/20 0624  WBC 5.4 6.9 7.9 5.3  NEUTROABS  --  4.9 5.9  --   HGB 11.1* 11.9* 9.9* 9.5*  HCT 32.0* 33.9* 26.8* 26.0*  MCV 83.8 83.9 80.0 78.8*  PLT 274 289 283 768   Basic Metabolic Panel: Recent Labs  Lab 11/28/20 0351 11/29/20 2054 12/01/20 0300 12/04/20 0624  NA 138 132* 136 136  K 3.8 4.1 3.5 3.2*  CL 108 100 107 106  CO2 19* '23 22 23  ' GLUCOSE 129* 95 112* 90  BUN 5* <5* <5* <5*  CREATININE 0.39* 0.58 0.49 0.52  CALCIUM 9.1 9.1 8.7* 8.6*   Liver Function Tests: Recent Labs  Lab 11/28/20 0351 11/29/20 2054 12/01/20 0300 12/03/20 0333 12/04/20 0624  AST 259* 235* 227* 302* 278*  ALT 223* 246* 221* 232* 244*  ALKPHOS 327* 373* 353* 455* 506*  BILITOT 5.4* 7.2* 6.2* 6.9* 7.4*  PROT 6.2* 6.5 5.3* 5.2* 5.1*  ALBUMIN 3.0* 3.1* 2.5* 2.4* 2.3*   CBG: Recent Labs  Lab 12/03/20 0817 12/03/20 1141 12/03/20 1703 12/03/20 2119 12/04/20 1157  GLUCAP 84 106* 91 71 83    Time spent: 35 minutes  Signed:  Berle Mull  [facility]  12/04/2020 9:59 AM

## 2020-12-04 NOTE — TOC Progression Note (Signed)
Transition of Care [facility]) - Progression Note    Patient Details  Name: Cynthia [name] MRN: [id number] Date of Birth: 11-17-1980  Transition of Care [facility]) CM/SW Contact  Sharlet Salina Mila Homer, LCSW Phone Number: 12/04/2020, 12:59 PM  Clinical Narrative:  Patient was scheduled to be transported from [facility] to [place], [place] for a procedure today. Several call made to [place] with [facility] regarding transport.  9:46 am: Call made to [place] [phone]) to determine pick-up time for this morning. Talked with Caryl Pina and was informed that they are still working on getting transport arranged. CSW will be called back with information regarding transport.  11:01 am: Call received from [facility] with [facility] [phone]) regarding patient's appointment and update provided regarding my call to [place] to get pick-up time for patient this morning and waiting for a return call.  11:09 am: Talked with Alyssa with [facility] and informed her that transport had not been set-up with a provider and was waiting to hear from the transport company. 11:40 am: Call received from Dorothy with [facility] and CSW advised that the transport needs to be rescheduled for Thursday, 3/24. They would like for the time to remain the same: arriving at 11 am for her 1 pm appointment. She asked to be called back at [phone]. 11:47 am: Call made to [place] [phone]) and talked with [facility] regarding rescheduling patient's appointment for Thursday, same time. CSW advised that she will work on and call me back. 12:04 pm: Talked again with Melanie with [facility] and provided with 3/24 transport information. Patient will be picked up at 8:35 am (transport can arrive as early as 8:20 am and as late as 8:50 am) and will be transported to [facility], [place], [place] will need to be contacted regarding pick-up. Trip  [id number].   12:19 pm: Contacted Alyssa with [facility] and 3/24 transport details provided. Alyssa indicated that transport generally waits, but if not they need to be readily available to transport patient back to [facility]. 12:28 pm: Call made to [facility] [phone]) and talked with [place] regarding patient's return transport. CSW informed that transport providers don't wait, and patient will be set-up as a will-call for the return ride and the wait time can be up to an hour. CSW provided with confirmation number - [phone]. 12:38 pm: Call made to [place] [phone]) and informed her of call with [facility]. Provided her with contact information. Per Alyssa pick-up time can be approximately 2:30 pm.  1:33 pm: Received call from Annetta North with [facility] and was informed that the transport back to the hospital won't be covered by patient's insurance, as she would still be an inpatient in a hospital. Other transport will have to be arranged for patient to get back to [facility] hospital. She explained that it is not a part of non-emergency medical transport serviced since she is returning to the hospital. This expense would have to be covered by another source or the hospital needs to work out other transportation arrangements. **Call made to Alyssa: updated her on transport issue and advised her that MD will be updated. 2:21 pm: Call made to [facility] transport and talked with Mo regarding transportation need. Per Mo, she will work on it and get back with CSW. 2:57 pm: Received call from Mo with [facility] transport and was informed that none of their vendors or PTAR have availability. 3:19 pm: Secure  chat with MD regarding no transportation availability back to [facility]. Talked with MD and he asked if patient can pay the transport company privately. 3:34 pm: [facility] transport contacted [phone]) and CSW informed by [place] that the patient cannot pay out pocket for transport as transport can be arranged and  paid only by the her insurance.   3:54 pm: Talked again with MD and was informed that other arrangements will be made, another MD will talk with patient and the appointment has been cancelled.  6:33 pm: TC to [facility] with [facility] transportation and the ride had already been cancelled.  CSW will continue to follow and provide any other SW intervention services as needed.            Expected Discharge Plan and Services                                                 Social Determinants of Health (SDOH) Interventions    Readmission Risk Interventions No flowsheet data found.

## 2020-12-05 ENCOUNTER — Encounter (HOSPITAL_COMMUNITY): Payer: Self-pay | Admitting: Gastroenterology

## 2020-12-05 DIAGNOSIS — C250 Malignant neoplasm of head of pancreas: Secondary | ICD-10-CM

## 2020-12-05 LAB — IRON AND TIBC
Iron: 119 ug/dL (ref 28–170)
Saturation Ratios: 35 % — ABNORMAL HIGH (ref 10.4–31.8)
TIBC: 342 ug/dL (ref 250–450)
UIBC: 223 ug/dL

## 2020-12-05 LAB — CBC
HCT: 27 % — ABNORMAL LOW (ref 36.0–46.0)
Hemoglobin: 9.9 g/dL — ABNORMAL LOW (ref 12.0–15.0)
MCH: 28.9 pg (ref 26.0–34.0)
MCHC: 36.7 g/dL — ABNORMAL HIGH (ref 30.0–36.0)
MCV: 78.9 fL — ABNORMAL LOW (ref 80.0–100.0)
Platelets: 236 10*3/uL (ref 150–400)
RBC: 3.42 MIL/uL — ABNORMAL LOW (ref 3.87–5.11)
RDW: 14.3 % (ref 11.5–15.5)
WBC: 6.1 10*3/uL (ref 4.0–10.5)
nRBC: 0 % (ref 0.0–0.2)

## 2020-12-05 LAB — GLUCOSE, CAPILLARY: Glucose-Capillary: 91 mg/dL (ref 70–99)

## 2020-12-05 LAB — COMPREHENSIVE METABOLIC PANEL
ALT: 249 U/L — ABNORMAL HIGH (ref 0–44)
AST: 279 U/L — ABNORMAL HIGH (ref 15–41)
Albumin: 2.5 g/dL — ABNORMAL LOW (ref 3.5–5.0)
Alkaline Phosphatase: 545 U/L — ABNORMAL HIGH (ref 38–126)
Anion gap: 6 (ref 5–15)
BUN: <5 mg/dL — ABNORMAL LOW (ref 6–20)
CO2: 22 mmol/L (ref 22–32)
Calcium: 9 mg/dL (ref 8.9–10.3)
Chloride: 106 mmol/L (ref 98–111)
Creatinine, Ser: 0.46 mg/dL (ref 0.44–1.00)
GFR, Estimated: >60 mL/min (ref >60)
Glucose, Bld: 85 mg/dL (ref 70–99)
Potassium: 3.7 mmol/L (ref 3.5–5.1)
Sodium: 134 mmol/L — ABNORMAL LOW (ref 135–145)
Total Bilirubin: 7.7 mg/dL — ABNORMAL HIGH (ref 0.3–1.2)
Total Protein: 5.4 g/dL — ABNORMAL LOW (ref 6.5–8.1)

## 2020-12-05 LAB — CYTOLOGY - NON PAP

## 2020-12-05 LAB — VITAMIN B12: Vitamin B-12: 725 pg/mL (ref 180–914)

## 2020-12-05 LAB — FERRITIN: Ferritin: 300 ng/mL (ref 11–307)

## 2020-12-05 LAB — MAGNESIUM: Magnesium: 1.4 mg/dL — ABNORMAL LOW (ref 1.7–2.4)

## 2020-12-05 MED ORDER — PRE-NATAL FORMULA PO TABS: 1.0000 | ORAL_TABLET | Freq: Every day | ORAL | 0 refills | Status: DC

## 2020-12-05 MED ORDER — TRAMADOL HCL 50 MG PO TABS: 50.0000 mg | ORAL_TABLET | Freq: Four times a day (QID) | ORAL | Status: DC | PRN

## 2020-12-05 MED ORDER — TRAMADOL HCL 50 MG PO TABS: 50.0000 mg | ORAL_TABLET | Freq: Four times a day (QID) | ORAL | 0 refills | Status: DC | PRN

## 2020-12-05 MED ORDER — MAGNESIUM SULFATE 2 GM/50ML IV SOLN
2.0000 g | Freq: Once | INTRAVENOUS | Status: AC
  Administered 2020-12-05: 2 g via INTRAVENOUS
  Filled 2020-12-05: qty 50

## 2020-12-05 MED ORDER — NICOTINE 14 MG/24HR TD PT24: 14.0000 mg | MEDICATED_PATCH | Freq: Every day | TRANSDERMAL | 0 refills | Status: DC

## 2020-12-05 MED ORDER — DOCUSATE SODIUM 100 MG PO CAPS: 100.0000 mg | ORAL_CAPSULE | Freq: Two times a day (BID) | ORAL | 0 refills | Status: DC

## 2020-12-05 MED ORDER — DOCUSATE SODIUM 100 MG PO CAPS
100.0000 mg | ORAL_CAPSULE | Freq: Two times a day (BID) | ORAL | Status: DC
  Administered 2020-12-05: 100 mg via ORAL
  Filled 2020-12-05: qty 1

## 2020-12-05 NOTE — Progress Notes (Signed)
Gastroenterology Inpatient Follow-up Note   PATIENT IDENTIFICATION  Cynthia [name] is a 40 y.o. female with a pmh significant for prior PE, tobacco use, multiple pregnancies, currently pregnant (12-1/2 weeks), recent pancreatitis with obstructive jaundice.  Hospital Day: 7  SUBJECTIVE  I was able to confirm with [facility] that an outpatient ERCP could be performed with her insurance.  We have arranged for that for tomorrow at [facility] in [place] at 10 AM.   Patient appreciative for all the work to get that taken care of in an attempt of trying to prevent her from needing a percutaneous biliary drain. The patient denies fevers or chills. The patient pathology has returned as outlined below.  Patient continues to have some abdominal discomfort but is able to eat and is having a cheeseburger today.   OBJECTIVE  Scheduled Inpatient Medications:  . docusate sodium  100 mg Oral BID  . nicotine  14 mg Transdermal Daily  . sodium chloride flush  10-40 mL Intracatheter Q12H  . sodium chloride flush  3 mL Intravenous Q12H   Continuous Inpatient Infusions:  PRN Inpatient Medications: albuterol, diphenhydrAMINE, fentaNYL (SUBLIMAZE) injection, hydrALAZINE, ondansetron **OR** ondansetron (ZOFRAN) IV, sodium chloride flush   Physical Examination  Temp:  [98.1 F (36.7 C)-99.1 F (37.3 C)] 98.3 F (36.8 C) (03/24 0847) Pulse Rate:  [64-72] 72 (03/24 0847) Resp:  [14-18] 16 (03/24 0847) BP: (103-127)/(56-83) 114/56 (03/24 0847) SpO2:  [96 %-100 %] 96 % (03/24 0847) Weight:  [77 kg] 77 kg (03/23 2100) Temp (24hrs), Avg:98.5 F (36.9 C), Min:98.1 F (36.7 C), Max:99.1 F (37.3 C)  Weight: 77 kg GEN: NAD, appears stated age, doesn't appear chronically ill PSYCH: Cooperative, without pressured speech EYE: Sclerae icteric ENT: MMM CV: Nontachycardic RESP: No audible wheezing GI: NABS, soft, tenderness to palpation in midepigastrium, nongravid abdomen and lower portion MSK/EXT: No lower  extremity edema SKIN: Visible jaundice NEURO:  Alert & Oriented x 3, no focal deficits   Review of Data   Laboratory Studies   Recent Labs  Lab 12/05/20 0424  NA 134*  K 3.7  CL 106  CO2 22  BUN <5*  CREATININE 0.46  GLUCOSE 85  CALCIUM 9.0  MG 1.4*   Recent Labs  Lab 12/05/20 0424  AST 279*  ALT 249*  ALKPHOS 545*    Recent Labs  Lab 12/03/20 0333 12/04/20 0624 12/05/20 0424  WBC 7.9 5.3 6.1  HGB 9.9* 9.5* 9.9*  HCT 26.8* 26.0* 27.0*  PLT 283 296 236   No results for input(s): APTT, INR in the last 168 hours. MELD-Na score: 18 at 11/29/2020  8:54 PM MELD score: 14 at 11/29/2020  8:54 PM Calculated from: Serum Creatinine: 0.58 mg/dL (Using min of 1 mg/dL) at 11/29/2020  8:54 PM Serum Sodium: 132 mmol/L at 11/29/2020  8:54 PM Total Bilirubin: 7.2 mg/dL at 11/29/2020  8:54 PM INR(ratio): 0.9 (Using min of 1) at 11/28/2020  3:51 AM Age: 40 years  Imaging Studies  No results found.  GI Procedures and Studies  Cytology results FINAL MICROSCOPIC DIAGNOSIS:  A. HEAD OF PANCREAS, FINE NEEDLE ASPIRATION:  - Malignant cells present consistent with adenocarcinoma.    ASSESSMENT  Cynthia [name] is a 40 y.o. female with a pmh significant for prior PE, tobacco use, multiple pregnancies, currently pregnant (12-1/2 weeks), recent pancreatitis with obstructive jaundice.   The patient is hemodynamically stable.  Clinically is improving and stable.  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  She still has  obstruction.  We have arranged for an ERCP outpatient attempt at [facility] for repeat bile duct stenting if possible.  If she fails this then percutaneous biliary drainage placement and subsequent ERCP with stent placement can be pursued if necessary.  She was visibly upset about the diagnosis as I spoke with her about this afternoon.  She did not want me to speak with anyone else about the diagnosis.  She is aware of the need and repeat attempt of ERCP  at [facility].  We have given her the directions and timing to be at [facility].  Oncology referral will be placed by the medicine service.  We will be available to help her throughout.  She was found to have H. pylori gastritis as well but they are bigger fish to fry we can consider treatment in the near future and she can be on PPI therapy if necessary but we can figure this out in the coming weeks.  She needs to follow-up with obstetrics/gynecology.  All patient questions were answered to the best of my ability, and the patient agrees to the aforementioned plan of action with follow-up as indicated.   PLAN/RECOMMENDATIONS  Oncology referral to be placed by medicine service H. pylori gastritis noted but we will hold on treatment for now Repeat ERCP attempt at [facility] -12/06/2020 at 10 AM --arrived at 8:30 AM at [facility] clinic [place] --repeat ERCP attempt with Dr. Wynetta Emery Patient needs to follow-up with obstetrics/gynecology Pain medications should be administered through PCP until oncology evaluation   Please page/call with questions or concerns.   Cynthia Britain, MD [facility] Office # [phone]    LOS: 5 days  Cynthia [name]  12/05/2020, 4:52 PM

## 2020-12-05 NOTE — Progress Notes (Signed)
Discharge instructions reviewed with pt by RN Wyvonnia Lora.  Copy of instructions given to pt and informed of scripts sent to her pharmacy.  Pt has called for her ride to pick her up. Pt will inform staff when her ride is here.   Informed pt to be NPO after MN tonight for procedure at [facility] in the morning. Pt verbalized understanding.

## 2020-12-05 NOTE — Discharge Instructions (Signed)
Acute Pancreatitis  The pancreas is a gland that is located behind the stomach on the left side of the abdomen. It produces enzymes that help to digest food. The pancreas also releases the hormones glucagon and insulin, which help to regulate blood sugar. Acute pancreatitis happens when inflammation of the pancreas suddenly occurs and the pancreas becomes irritated and swollen. Most acute attacks last a few days and cause serious problems. Some people become dehydrated and develop low blood pressure. In severe cases, bleeding in the abdomen can lead to shock and can be life-threatening. The lungs, heart, and kidneys may fail. What are the causes? This condition may be caused by:  Alcohol abuse.  Drug abuse.  Gallstones or other conditions that can block the tube that drains the pancreas (pancreatic duct).  A tumor in the pancreas. Other causes include:  Certain medicines.  Exposure to certain chemicals.  Diabetes.  An infection in the pancreas.  Damage caused by an accident (trauma).  The poison (venom) from a scorpion bite.  Abdominal surgery.  Autoimmune pancreatitis. This is when the body's disease-fighting (immune) system attacks the pancreas.  Genes that are passed from parent to child (inherited). In some cases, the cause of this condition is not known. What are the signs or symptoms? Symptoms of this condition include:  Pain in the upper abdomen that may radiate to the back. Pain may be severe.  Tenderness and swelling of the abdomen.  Nausea and vomiting.  Fever. How is this diagnosed? This condition may be diagnosed based on:  A physical exam.  Blood tests.  Imaging tests, such as X-rays, CT or MRI scans, or an ultrasound of the abdomen. How is this treated? Treatment for this condition usually requires a stay in the hospital. Treatment for this condition may include:  Pain medicine.  Fluid replacement through an IV.  Placing a tube in the stomach  to remove stomach contents and to control vomiting (NG tube, or nasogastric tube).  Not eating for 3-4 days. This gives the pancreas a rest, because enzymes are not being produced that can cause further damage.  Antibiotic medicines, if your condition is caused by an infection.  Treating any underlying conditions that may be the cause.  Steroid medicines, if your condition is caused by your immune system attacking your body's own tissues (autoimmune disease).  Surgery on the pancreas or gallbladder. Follow these instructions at home: Eating and drinking  Follow instructions from your health care provider about diet. This may involve avoiding alcohol and decreasing the amount of fat in your diet.  Eat smaller, more frequent meals. This reduces the amount of digestive fluids that the pancreas produces.  Drink enough fluid to keep your urine pale yellow.  Do not drink alcohol if it caused your condition.   General instructions  Take over-the-counter and prescription medicines only as told by your health care provider.  Do not drive or use heavy machinery while taking prescription pain medicine.  Ask your health care provider if the medicine prescribed to you can cause constipation. You may need to take steps to prevent or treat constipation, such as: ? Take an over-the-counter or prescription medicine for constipation. ? Eat foods that are high in fiber such as whole grains and beans. ? Limit foods that are high in fat and processed sugars, such as fried or sweet foods.  Do not use any products that contain nicotine or tobacco, such as cigarettes, e-cigarettes, and chewing tobacco. If you need help quitting, ask  your health care provider.  Get plenty of rest.  If directed, check your blood sugar at home as told by your health care provider.  Keep all follow-up visits as told by your health care provider. This is important. Contact a health care provider if you:  Do not recover  as quickly as expected.  Develop new or worsening symptoms.  Have persistent pain, weakness, or nausea.  Recover and then have another episode of pain.  Have a fever. Get help right away if:  You cannot eat or keep fluids down.  Your pain becomes severe.  Your skin or the white part of your eyes turns yellow (jaundice).  You have sudden swelling in your abdomen.  You vomit.  You feel dizzy or you faint.  Your blood sugar is high (over 300 mg/dL). Summary  Acute pancreatitis happens when inflammation of the pancreas suddenly occurs and the pancreas becomes irritated and swollen.  This condition is typically caused by alcohol abuse, drug abuse, or gallstones.  Treatment for this condition usually requires a stay in the hospital. This information is not intended to replace advice given to you by your health care provider. Make sure you discuss any questions you have with your health care provider. Document Revised: 06/20/2018 Document Reviewed: 03/07/2018 Elsevier Patient Education  2021 [place].   Activity Restriction During Pregnancy Your health care provider may recommend specific activity restrictions during pregnancy for a variety of reasons. Activity restriction may require that you limit activities that require great effort, such as exercise, lifting, or sex. The type of activity restriction will vary for each person, depending on your risk or the problems you are having. Activity restriction may be recommended for a period of time until your baby is delivered. Why are activity restrictions recommended? Activity restriction may be recommended if:  Your placenta is partially or completely covering the opening of your cervix (placenta previa).  There is bleeding between the wall of the uterus and the amniotic sac in the first trimester of pregnancy (subchorionic hemorrhage).  You went into labor too early (preterm labor).  You have a history of  miscarriage.  You have a condition that causes high blood pressure during pregnancy (preeclampsia or eclampsia).  You are pregnant with more than one baby.  Your baby is not growing well. What are the risks? The risks depend on your specific restriction. Strict bed rest has the most physical and emotional risks and is no longer routinely recommended. Risks of strict bed rest include:  Loss of muscle conditioning from not moving.  Blood clots.  Social isolation.  Depression.  Loss of income. Talk with your health care team about activity restriction to decide if it is best for you and your baby. Even if you are having problems during your pregnancy, you may be able to continue with normal levels of activity with careful monitoring by your health care team. Follow these instructions at home: If needed, based on your overall health and the health of your baby, your health care provider will decide which type of activity restriction is right for you. Activity restrictions may include:  Not lifting anything heavier than 10 pounds (4.5 kg).  Avoiding activities that take a lot of physical effort.  No lifting or straining.  Resting in a sitting position or lying down for periods of time during the day. Pelvic rest may be recommended along with activity restrictions. If pelvic rest is recommended, then:  Do not have sex, an orgasm, or use  sexual stimulation.  Do not use tampons. Do not douche. Do not put anything into your vagina.  Do not lift anything that is heavier than 10 lb (4.5 kg).  Avoid activities that require a lot of effort.  Avoid any activity in which your pelvic muscles could become strained, such as squatting.   Questions to ask your health care provider  Why is my activity being limited?  How will activity restrictions affect my body?  Why is rest helpful for me and my baby?  What activities can I do?  When can I return to normal activities? When should I  seek immediate medical care? Seek immediate medical care if you have:  Vaginal bleeding.  Vaginal discharge.  Cramping pain in your lower abdomen.  Regular contractions.  A low, dull backache. Summary  Your health care provider may recommend specific activity restrictions during pregnancy for a variety of reasons.  Activity restriction may require that you limit activities such as exercise, lifting, sex, or any other activity that requires great effort.  Discuss the risks and benefits of activity restriction with your health care team to decide if it is best for you and your baby.  Contact your health care provider right away if you think you are having contractions, or if you notice vaginal bleeding, discharge, or cramping. This information is not intended to replace advice given to you by your health care provider. Make sure you discuss any questions you have with your health care provider. Document Revised: 05/24/2019 Document Reviewed: 12/21/2017 Elsevier Patient Education  [place].

## 2020-12-05 NOTE — Progress Notes (Signed)
I spoke to patient regarding referral we received today from the hospitalist Dr. Posey Pronto.  She has agreed to come in for medical oncology consult Tuesday 12/10/2020 at 11:00 with Dr. Truitt Merle.  She knows to arrive at least 15 minutes prior to check in and that she can bring one person with her to this consult. She is aware of our location.

## 2020-12-05 NOTE — TOC Transition Note (Signed)
Transition of Care [facility]) - CM/SW Discharge Note   Patient Details  Name: Cynthia [name] MRN: [id number] Date of Birth: Feb 09, 1981  Transition [facility] Care [facility]) CM/SW Contact:  Pollie Friar, RN Phone Number: 12/05/2020, 1:40 PM   Clinical Narrative:    Pt is discharging home with self care. Pt has appointment tomorrow at [facility] for ERCP at 10 am. CM met with the patient to go over transportation tomorrow and she states she has transportation worked out for the appointment. She states she also has transport home today.  No further needs per CM.   Final next level of care: Home/Self Care Barriers to Discharge: No Barriers Identified   Patient Goals and CMS Choice        Discharge Placement                       Discharge Plan and Services                                     Social Determinants of Health (SDOH) Interventions     Readmission Risk Interventions No flowsheet data found.

## 2020-12-05 NOTE — Progress Notes (Signed)
Pt's ride here, pt d/c'd via wheelchair with belongings, escorted by unit staff.

## 2020-12-06 NOTE — Discharge Summary (Signed)
[facility] Discharge Summary   Patient: Cynthia [name] ASN:[id number]  PCP[facility]  Date of admission: 11/29/2020   Date of discharge: 12/05/2020      Discharge Diagnoses:  Principal Problem:   Pancreatitis Active Problems:   Cigarette smoker   First trimester pregnancy   Obstructive jaundice   Pancreatic mass   History of pulmonary embolus (PE)   Transaminitis  Admitted From: home Disposition:  Home   Recommendations for Outpatient Follow-up:  1. PCP: follow up with PCP, Oncology, General surgery, Gastroenterology as recommended  2. Follow up LABS/TEST:  none 3. New Meds: prenatal 4. Changed meds: none 5. Stopped meds: naproxen   Follow-up [employer]. Schedule an appointment as soon as possible for a visit in 1 week(s).   Contact information: [place] [address] [phone]        [facility] [place] on 12/06/2020.   Why: Scheduled for 1000AM tomorrow.  You need to be there at 830 AM at [facility].  Contact information: [facility] [place] [address] [phone]        Stark Klein, MD Follow up on 12/10/2020.   Specialty: General Surgery Why: Your appointment is at 3:15 PM.  Be at the office 30 minutes early for check in.  Bring photo ID and insurance information with your to the office.   Contact information: [address] [place] [address] [phone]        [facility]. Schedule an appointment as soon as possible for a visit in 1 month(s).   Specialty: Gastroenterology Contact information: [place] [phone] [place]. Schedule an appointment as soon as possible for a visit in 1 week(s).   Why: follow up with [facility] or alternatively can establish care with [facility] for [facility]  information: [address]  [address] [place] [address] [phone]        Constant, Peggy, MD. Schedule an appointment as soon as possible for a visit in 1 week(s).   Specialty: Obstetrics and Gynecology Why:  follow up with [facility] or alternatively can establish care with [facility] for [facility] Contact information: [place] [address] [phone]              Discharge Instructions    Ambulatory referral to Oncology   Complete by: As directed    Diet - low sodium heart healthy   Complete by: As directed    Increase activity slowly   Complete by: As directed       Diet recommendation: Regular diet  Activity: The patient is advised to gradually reintroduce usual activities, as tolerated  Discharge Condition: stable  Code Status: Full code   History of present illness: As per the H and P dictated on admission, "Cynthia [name] is a 40 y.o. female with medical history significant of tobacco abuse, history of PE, and [redacted] weeks pregnant presents with complaints of abdominal pain over the last 1-1/2 weeks.  Patient had just recently been hospitalized from 3/14-3/17 with complaints of right upper abdominal pain and was admitted for suspected gallstone pancreatitis.  During hospitalization patient was worked up with MRCP which noted concern for pancreatic mass with obstructive jaundice.  Plan was for patient undergo ERCP, but she left AGAINST MEDICAL  ADVICE due to the not given any food.  Labs had noted that patient CA 19-9 was elevated up to 481 giving concern for the possibility of underlying malignancy.  She presents back to the hospital due to persistence of abdominal pain.  Complains of having achy epigastric abdominal pain that is worse on the left upper quadrant at this time.  Diarrhea symptoms have resolved.  Denies having any nausea, vomiting, shortness of breath, or chest pain.  ED Course: Upon admission into the  emergency department patient was seen to be afebrile vital signs relatively within normal limits.  Labs significant for hemoglobin 11.9, sodium 132, alkaline phosphatase 373, lipase 386, AST 235, ALT 246, and total bilirubin 7.2.  [facility] had been formally consulted."  Hospital Course:  Summary of her active problems in the hospital is as following.   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 Tolerating diet. Normal bowel function. MRCP consistent with pancreatic head mass. Failed ERCP 3/21, unsuccessful to cannulate bile duct and pancreatic duct. EUS 3/21, cytology positive for adenocarcinoma. Mild elevated lipase after procedure, already trending down. Patient is fairly asymptomatic.  Patient is in need for a specialized procedure.  Highly appreciate [facility] as well as [place] [facility] assistance in managing this patient.   First trimester pregnancy:  12-week pregnant at this time. HCG on 03/14 [id number].  US abdomen on 03/14: Single live intrauterine gestation of 11 weeks 1 day. Seen by OB/GYN. Recommended medical treatment for acute condition.  History of pulmonary embolism: Developed bilateral PE on 05/2015 while on OCP , postpartum 3 months. Hypercoagulable work-up including anticardiolipin antibody, lupus anticoagulant, beta-2 glycoprotein, antithrombin activities normal. Protein C, protein S normal functional. She was on anticoagulation for 6 months then discontinued. Currently denies any active bleeding issues. Defer to Gyn for Lovenox during pregnancy.  Smoker:  On nicotine patch. Counseled.Ready to quit.  Mild hypokalemia  Replaced  Anemia Hb 9.5 stable. Dropped from 11 on admission likely due to dilution.  No bleeding so far.  Iron, b12 folate normal.   Patient was ambulatory without any assistance. On the day of the discharge the patient's  vitals were stable, and no other acute medical condition were reported by patient. The patient was felt safe to be discharge at Home with no therapy needed on discharge.  Consultants: Gastroenterology  Procedures: ERCP with EUS 3/21 Impression: transfer to [facility] for further workup/management.  DISCHARGE MEDICATION: Allergies as of 12/05/2020   No Known Allergies     Medication List    STOP taking these medications   methocarbamol 500 MG tablet Commonly known as: ROBAXIN   naproxen 500 MG tablet Commonly known as: NAPROSYN     TAKE these medications   docusate sodium 100 MG capsule Commonly known as: COLACE Take 1 capsule (100 mg total) by mouth 2 (two) times daily.   Pre-Natal Formula Tabs Take 1 tablet by mouth daily.       Discharge Exam: Filed Weights   12/02/20 1451 12/03/20 0510 12/04/20 2100  Weight: 70.8 kg 77.9 kg 77 kg   Vitals:   12/05/20 0526 12/05/20 0847  BP: 103/65 (!) 114/56  Pulse: 64 72  Resp: 14 16  Temp: 99.1 F (37.3 C) 98.3 F (36.8 C)  SpO2: 98% 96%   General: Appear in no distress, no Rash; Oral Mucosa Clear, moist. no Abnormal Neck Mass Or lumps, Conjunctiva normal  Cardiovascular: S1 and S2 Present, no Murmur  Respiratory: good respiratory effort, Bilateral Air entry present and CTA, no Crackles, no wheezes Abdomen: Bowel Sound present, Soft and no tenderness Extremities: no Pedal edema Neurology: alert and oriented to time, place, and person affect appropriate. no new focal deficit  The results of significant diagnostics from this hospitalization (including imaging, microbiology, ancillary and laboratory) are listed below for reference.    Significant Diagnostic Studies: US OB Comp < 14 Wks  Result Date: 11/25/2020 CLINICAL DATA:  Pregnancy. EXAM: OBSTETRIC <14 WK ULTRASOUND TECHNIQUE: Transabdominal ultrasound was performed for evaluation of the gestation as well as the maternal uterus and adnexal regions. COMPARISON:   None. FINDINGS: Intrauterine gestational sac: Single Yolk sac:  Visualized. Embryo:  Visualized. Cardiac Activity: Visualized. Heart Rate: 158 bpm CRL:   43.3 mm   11 w 1 d                  [place] EDC: June 15, 2021. Subchorionic hemorrhage:  None visualized. Maternal uterus/adnexae: Ovaries are unremarkable. No free fluid is noted. IMPRESSION: Single live intrauterine gestation of 11 weeks 1 day. Electronically Signed   By: Marijo Conception M.D.   On: 11/25/2020 09:54   MR ABDOMEN MRCP WO CONTRAST  Addendum Date: 11/26/2020   ADDENDUM REPORT: 11/26/2020 14:18 ADDENDUM: These results were called by telephone at the time of interpretation on 11/26/2020 at 2:17 pm to provider [facility] , who verbally acknowledged these results. Other findings relate by the radiology assistant as outlined in the Z vision dash board. Electronically Signed   By: Zetta Bills M.D.   On: 11/26/2020 14:18   Result Date: 11/26/2020 CLINICAL DATA:  RIGHT upper quadrant abdominal pain. Nondiagnostic ultrasound. EXAM: MRI ABDOMEN WITHOUT CONTRAST  (INCLUDING MRCP) TECHNIQUE: Multiplanar multisequence MR imaging of the abdomen was performed. Heavily T2-weighted images of the biliary and pancreatic ducts were obtained, and three-dimensional MRCP images were rendered by post processing. COMPARISON:  Ultrasound evaluation of the same date showing dilated appearance of the biliary tree. FINDINGS: Lower chest: No consolidation. No pleural effusion noted at the lung bases. Hepatobiliary: Diffuse intra and extrahepatic biliary duct distension with abrupt termination in the pancreatic portion of the common bile duct. No pericholecystic fluid. Wall thickness approximately 3 mm. No filling defect in the common bile duct to suggest choledocholithiasis. Sludge in the gallbladder lumen. Low insertion of posterior division RIGHT hepatic ducts upon the biliary tree seen passing into the hepatic duct approximately 8 mm above the cystic duct.  Pancreas: Irregular pancreatic ductal distension. Loss of normal intrinsic T1 signal focally in the pancreatic head with masslike appearance, this corresponds to intermediate T2 signal. No area of restricted diffusion in this location. This area measures 2.0 x 1.9 cm.  Mild peripancreatic stranding. Spleen:  Normal size and contour.  No focal splenic lesion. Adrenals/Urinary Tract:  Adrenal glands are normal.  The No sign of hydronephrosis.  Smooth renal contours. Stomach/Bowel: No acute gastrointestinal process to the extent evaluated. Vascular/Lymphatic: Limited assessment on noncontrast imaging. No adenopathy. No signs of aneurysmal dilation of the abdominal aorta. Other:  No ascites. Musculoskeletal: No suspicious bone lesions identified. IMPRESSION: 1. Double duct sign with masslike area in the head of the pancreas, suspicious for pancreatic neoplasm. It is possible that if there is a history of repeated pancreatitis is could represent sequela of groove pancreatitis and stricture though the abrupt termination has a more malignant rather than benign appearance. CT with intravenous contrast of the abdomen only may be helpful for further evaluation to exclude  the possibility of pancreatic mass and guide further evaluation such as endoscopic ultrasound or ERCP. 2. Pancreatic ductal distension and mild relative atrophy. 3. Mild gallbladder wall thickening with mild diffusion abnormality associated with the gallbladder wall. Findings are nonspecific and could even be related to chronic cholecystitis in the setting of biliary obstruction, equivocal for acute biliary pathology. Contrasted imaging could also be helpful to assess for signs of acute inflammation 4. Mild peripancreatic stranding may be related to an overlay of acute mild interstitial edematous pancreatitis. 5. Biliary ductal variant with posterior RIGHT hepatic ducts draining just above the cystic duct confluence with the common bile duct. 6. Sludge in the  gallbladder lumen. 7. Mild peripancreatic stranding. A call is out to the referring provider to further discuss findings in the above case. Electronically Signed: By: Zetta Bills M.D. On: 11/26/2020 13:53   MR 3D Recon At Scanner  Addendum Date: 11/26/2020   ADDENDUM REPORT: 11/26/2020 14:18 ADDENDUM: These results were called by telephone at the time of interpretation on 11/26/2020 at 2:17 pm to provider [facility] , who verbally acknowledged these results. Other findings relate by the radiology assistant as outlined in the Z vision dash board. Electronically Signed   By: Zetta Bills M.D.   On: 11/26/2020 14:18   Result Date: 11/26/2020 CLINICAL DATA:  RIGHT upper quadrant abdominal pain. Nondiagnostic ultrasound. EXAM: MRI ABDOMEN WITHOUT CONTRAST  (INCLUDING MRCP) TECHNIQUE: Multiplanar multisequence MR imaging of the abdomen was performed. Heavily T2-weighted images of the biliary and pancreatic ducts were obtained, and three-dimensional MRCP images were rendered by post processing. COMPARISON:  Ultrasound evaluation of the same date showing dilated appearance of the biliary tree. FINDINGS: Lower chest: No consolidation. No pleural effusion noted at the lung bases. Hepatobiliary: Diffuse intra and extrahepatic biliary duct distension with abrupt termination in the pancreatic portion of the common bile duct. No pericholecystic fluid. Wall thickness approximately 3 mm. No filling defect in the common bile duct to suggest choledocholithiasis. Sludge in the gallbladder lumen. Low insertion of posterior division RIGHT hepatic ducts upon the biliary tree seen passing into the hepatic duct approximately 8 mm above the cystic duct. Pancreas: Irregular pancreatic ductal distension. Loss of normal intrinsic T1 signal focally in the pancreatic head with masslike appearance, this corresponds to intermediate T2 signal. No area of restricted diffusion in this location. This area measures 2.0 x 1.9 cm.  Mild  peripancreatic stranding. Spleen:  Normal size and contour.  No focal splenic lesion. Adrenals/Urinary Tract:  Adrenal glands are normal.  The No sign of hydronephrosis.  Smooth renal contours. Stomach/Bowel: No acute gastrointestinal process to the extent evaluated. Vascular/Lymphatic: Limited assessment on noncontrast imaging. No adenopathy. No signs of aneurysmal dilation of the abdominal aorta. Other:  No ascites. Musculoskeletal: No suspicious bone lesions identified. IMPRESSION: 1. Double duct sign with masslike area in the head of the pancreas, suspicious for pancreatic neoplasm. It is possible that if there is a history of repeated pancreatitis is could represent sequela of groove pancreatitis and stricture though the abrupt termination has a more malignant rather than benign appearance. CT with intravenous contrast of the abdomen only may be helpful for further evaluation to exclude the possibility of pancreatic mass and guide further evaluation such as endoscopic ultrasound or ERCP. 2. Pancreatic ductal distension and mild relative atrophy. 3. Mild gallbladder wall thickening with mild diffusion abnormality associated with the gallbladder wall. Findings are nonspecific and could even be related to chronic cholecystitis in the setting of biliary obstruction, equivocal  for acute biliary pathology. Contrasted imaging could also be helpful to assess for signs of acute inflammation 4. Mild peripancreatic stranding may be related to an overlay of acute mild interstitial edematous pancreatitis. 5. Biliary ductal variant with posterior RIGHT hepatic ducts draining just above the cystic duct confluence with the common bile duct. 6. Sludge in the gallbladder lumen. 7. Mild peripancreatic stranding. A call is out to the referring provider to further discuss findings in the above case. Electronically Signed: By: Zetta Bills M.D. On: 11/26/2020 13:53   DG C-Arm 1-60 Min-No Report  Result Date:  12/02/2020 Fluoroscopy was utilized by the requesting physician.  No radiographic interpretation.   US Abdomen Limited RUQ (LIVER/GB)  Result Date: 11/25/2020 CLINICAL DATA:  40 year old female with right upper quadrant pain for several weeks. EXAM: ULTRASOUND ABDOMEN LIMITED RIGHT UPPER QUADRANT COMPARISON:  CTA chest 05/25/2015. FINDINGS: Gallbladder: Gallbladder lumen appears filled with echogenic sludge, possibly with punctate floating stones (image 10). Wall thickness remains normal. No pericholecystic fluid. Equivocal sonographic Murphy sign. Common bile duct: Diameter: Up to 12 mm, dilated. No filling defect identified in the visible duct (image 21). Liver: I do suspect a degree of intrahepatic biliary ductal dilatation. No discrete liver lesion identified. Liver echogenicity at the upper limits of normal. Portal vein is patent on color Doppler imaging with normal direction of blood flow towards the liver. Other: Negative visible right kidney.  No free fluid. IMPRESSION: Gallbladder distended with sludge, possibly with floating punctate stones. No gallbladder wall thickening but dilated common bile duct and suspicion of early intrahepatic biliary ductal dilatation. This constellation suggests Choledocholithiasis. Recommend further evaluation. Electronically Signed   By: Genevie Ann M.D.   On: 11/25/2020 07:54    Microbiology: Recent Results (from the past 240 hour(s))  Resp Panel by RT-PCR (Flu A&B, Covid) Nasopharyngeal Swab     Status: None   Collection Time: 11/29/20  8:30 PM   Specimen: Nasopharyngeal Swab; Nasopharyngeal(NP) swabs in vial transport medium  Result Value Ref Range Status   SARS Coronavirus 2 by RT PCR NEGATIVE NEGATIVE Final    Comment: (NOTE) SARS-CoV-2 target nucleic acids are NOT DETECTED.  The SARS-CoV-2 RNA is generally detectable in upper respiratory specimens during the acute phase of infection. The lowest concentration of SARS-CoV-2 viral copies this assay can detect  is 138 copies/mL. A negative result does not preclude SARS-Cov-2 infection and should not be used as the sole basis for treatment or other patient management decisions. A negative result may occur with  improper specimen collection/handling, submission of specimen other than nasopharyngeal swab, presence of viral mutation(s) within the areas targeted by this assay, and inadequate number of viral copies(<138 copies/mL). A negative result must be combined with clinical observations, patient history, and epidemiological information. The expected result is Negative.  Fact Sheet for Patients:  EntrepreneurPulse.com.au  Fact Sheet for Healthcare Providers:  IncredibleEmployment.be  This test is no t yet approved or cleared by the [place] FDA and  has been authorized for detection and/or diagnosis of SARS-CoV-2 by FDA under an Emergency Use Authorization (EUA). This EUA will remain  in effect (meaning this test can be used) for the duration of the COVID-19 declaration under Section 564(b)(1) of the Act, 21 U.S.C.section 360bbb-3(b)(1), unless the authorization is terminated  or revoked sooner.       Influenza A by PCR NEGATIVE NEGATIVE Final   Influenza B by PCR NEGATIVE NEGATIVE Final    Comment: (NOTE) The Xpert Xpress SARS-CoV-2/FLU/RSV plus assay is intended  as an aid in the diagnosis of influenza from Nasopharyngeal swab specimens and should not be used as a sole basis for treatment. Nasal washings and aspirates are unacceptable for Xpert Xpress SARS-CoV-2/FLU/RSV testing.  Fact Sheet for Patients: EntrepreneurPulse.com.au  Fact Sheet for Healthcare Providers: IncredibleEmployment.be  This test is not yet approved or cleared by the [place] FDA and has been authorized for detection and/or diagnosis of SARS-CoV-2 by FDA under an Emergency Use Authorization (EUA). This EUA will remain in effect  (meaning this test can be used) for the duration of the COVID-19 declaration under Section 564(b)(1) of the Act, 21 U.S.C. section 360bbb-3(b)(1), unless the authorization is terminated or revoked.  Performed at [facility], [place] [address], [place], [place] [address]      Labs: CBC: Recent Labs  Lab 11/29/20 2054 12/03/20 0333 12/04/20 0624 12/05/20 0424  WBC 6.9 7.9 5.3 6.1  NEUTROABS 4.9 5.9  --   --   HGB 11.9* 9.9* 9.5* 9.9*  HCT 33.9* 26.8* 26.0* 27.0*  MCV 83.9 80.0 78.8* 78.9*  PLT 289 283 296 338   Basic Metabolic Panel: Recent Labs  Lab 11/29/20 2054 12/01/20 0300 12/04/20 0624 12/05/20 0424  NA 132* 136 136 134*  K 4.1 3.5 3.2* 3.7  CL 100 107 106 106  CO2 23 22 23 22   GLUCOSE 95 112* 90 85  BUN <5* <5* <5* <5*  CREATININE 0.58 0.49 0.52 0.46  CALCIUM 9.1 8.7* 8.6* 9.0  MG  --   --   --  1.4*   Liver Function Tests: Recent Labs  Lab 11/29/20 2054 12/01/20 0300 12/03/20 0333 12/04/20 0624 12/05/20 0424  AST 235* 227* 302* 278* 279*  ALT 246* 221* 232* 244* 249*  ALKPHOS 373* 353* 455* 506* 545*  BILITOT 7.2* 6.2* 6.9* 7.4* 7.7*  PROT 6.5 5.3* 5.2* 5.1* 5.4*  ALBUMIN 3.1* 2.5* 2.4* 2.3* 2.5*   CBG: Recent Labs  Lab 12/03/20 1141 12/03/20 1703 12/03/20 2119 12/04/20 0625 12/05/20 0636  GLUCAP 106* 91 71 83 91    Time spent: 35 minutes  Signed:  Berle Mull  [facility] 12/05/2020 8:54 AM

## 2020-12-07 ENCOUNTER — Emergency Department (HOSPITAL_COMMUNITY)
Admission: EM | Admit: 2020-12-07 | Discharge: 2020-12-07 | Disposition: A | Discharge status: Home / Self Care | Payer: Medicaid Other | Attending: Emergency Medicine | Admitting: Emergency Medicine

## 2020-12-07 DIAGNOSIS — O26891 Other specified pregnancy related conditions, first trimester: Secondary | ICD-10-CM | POA: Diagnosis present

## 2020-12-07 DIAGNOSIS — O99331 Smoking (tobacco) complicating pregnancy, first trimester: Secondary | ICD-10-CM | POA: Diagnosis not present

## 2020-12-07 DIAGNOSIS — F17210 Nicotine dependence, cigarettes, uncomplicated: Secondary | ICD-10-CM | POA: Diagnosis not present

## 2020-12-07 DIAGNOSIS — R109 Unspecified abdominal pain: Secondary | ICD-10-CM | POA: Insufficient documentation

## 2020-12-07 DIAGNOSIS — Z3A12 12 weeks gestation of pregnancy: Secondary | ICD-10-CM | POA: Diagnosis not present

## 2020-12-07 DIAGNOSIS — R1010 Upper abdominal pain, unspecified: Secondary | ICD-10-CM

## 2020-12-07 LAB — COMPREHENSIVE METABOLIC PANEL
ALT: 276 U/L — ABNORMAL HIGH (ref 0–44)
AST: 293 U/L — ABNORMAL HIGH (ref 15–41)
Albumin: 2.9 g/dL — ABNORMAL LOW (ref 3.5–5.0)
Alkaline Phosphatase: 693 U/L — ABNORMAL HIGH (ref 38–126)
Anion gap: 7 (ref 5–15)
BUN: <5 mg/dL — ABNORMAL LOW (ref 6–20)
CO2: 22 mmol/L (ref 22–32)
Calcium: 8.9 mg/dL (ref 8.9–10.3)
Chloride: 104 mmol/L (ref 98–111)
Creatinine, Ser: 0.53 mg/dL (ref 0.44–1.00)
GFR, Estimated: >60 mL/min (ref >60)
Glucose, Bld: 97 mg/dL (ref 70–99)
Potassium: 3.6 mmol/L (ref 3.5–5.1)
Sodium: 133 mmol/L — ABNORMAL LOW (ref 135–145)
Total Bilirubin: 5 mg/dL — ABNORMAL HIGH (ref 0.3–1.2)
Total Protein: 6.4 g/dL — ABNORMAL LOW (ref 6.5–8.1)

## 2020-12-07 LAB — CBC WITH DIFFERENTIAL/PLATELET
Abs Immature Granulocytes: 0.06 10*3/uL (ref 0.00–0.07)
Basophils Absolute: 0.1 10*3/uL (ref 0.0–0.1)
Basophils Relative: 1 %
Eosinophils Absolute: 0.1 10*3/uL (ref 0.0–0.5)
Eosinophils Relative: 1 %
HCT: 30.3 % — ABNORMAL LOW (ref 36.0–46.0)
Hemoglobin: 11.2 g/dL — ABNORMAL LOW (ref 12.0–15.0)
Immature Granulocytes: 1 %
Lymphocytes Relative: 18 %
Lymphs Abs: 1.5 10*3/uL (ref 0.7–4.0)
MCH: 28.9 pg (ref 26.0–34.0)
MCHC: 37 g/dL — ABNORMAL HIGH (ref 30.0–36.0)
MCV: 78.3 fL — ABNORMAL LOW (ref 80.0–100.0)
Monocytes Absolute: 0.5 10*3/uL (ref 0.1–1.0)
Monocytes Relative: 6 %
Neutro Abs: 6.1 10*3/uL (ref 1.7–7.7)
Neutrophils Relative %: 73 %
Platelets: 360 10*3/uL (ref 150–400)
RBC: 3.87 MIL/uL (ref 3.87–5.11)
RDW: 14.8 % (ref 11.5–15.5)
WBC: 8.3 10*3/uL (ref 4.0–10.5)
nRBC: 0 % (ref 0.0–0.2)

## 2020-12-07 LAB — LIPASE, BLOOD: Lipase: 197 U/L — ABNORMAL HIGH (ref 11–51)

## 2020-12-07 MED ORDER — OXYCODONE-ACETAMINOPHEN 5-325 MG PO TABS: 1.0000 | ORAL_TABLET | ORAL | 0 refills | Status: DC | PRN

## 2020-12-07 MED ORDER — OXYCODONE-ACETAMINOPHEN 5-325 MG PO TABS
1.0000 | ORAL_TABLET | Freq: Once | ORAL | Status: AC
  Administered 2020-12-07: 1 via ORAL
  Filled 2020-12-07: qty 1

## 2020-12-07 MED ORDER — MORPHINE SULFATE (PF) 4 MG/ML IV SOLN
4.0000 mg | Freq: Once | INTRAVENOUS | Status: AC
Start: 2020-12-07 — End: 2020-12-07
  Administered 2020-12-07: 4 mg via INTRAVENOUS
  Filled 2020-12-07: qty 1

## 2020-12-07 NOTE — ED Provider Notes (Signed)
[facility] EMERGENCY DEPARTMENT Provider Note   CSN: [id number] Arrival date & time: 12/07/20  0230     History No chief complaint on file.   Cynthia [name] is a 40 y.o. female.  Patient to ED with complaint of abdominal pain. Recent treatment for obstructive jaundice by admission 3/14-3/17 for presumed gall stone pancreatitis, found to have a pancreatic mass during MRCP, left AMA prior to planned ERCP, and returned 3/18 for continued abdominal pain. She was noted to have elevated cancer markers causing concern for underlying malignancy. On 3/24 she was seen at [facility] for biliary stenting, with follow up to be in [place], [facility], Dr. Rush Landmark, for further care. She reports her pain was controlled with medications provided at [facility], however, she does not have anything for pain at home. No nausea, vomiting, fever.   The history is provided by the patient. No language interpreter was used.       Past Medical History:  Diagnosis Date  . History of pulmonary embolism   . Normal pregnancy in multigravida in third trimester 07/18/2018  . Pancreatitis, acute 12/04/2020  . Tobacco abuse     Patient Active Problem List   Diagnosis Date Noted  . Obstructive jaundice 11/30/2020  . Pancreatic mass 11/30/2020  . History of pulmonary embolus (PE) 11/30/2020  . Transaminitis 11/30/2020  . Abnormal MRI of the abdomen   . Biliary obstruction   . Acute pancreatitis 11/26/2020  . Choledocholithiasis 11/25/2020  . Cholelithiasis 11/25/2020  . Pancreatitis 11/25/2020  . Diarrhea 11/25/2020  . First trimester pregnancy   . Status post repeat low transverse cesarean section 08/03/2018  . Pregnancy 07/18/2018  . Sinus bradycardia 05/26/2015  . PE (pulmonary embolism) 05/25/2015  . Cigarette smoker 05/25/2015  . Pulmonary emboli ([place])   . H/O cesarean section 02/27/2015    Past Surgical History:  Procedure Laterality Date  . BIOPSY  12/02/2020   Procedure:  BIOPSY;  Surgeon: Rush Landmark Telford Nab., MD;  Location: [facility];  Service: Gastroenterology;;  . CESAREAN SECTION    . CESAREAN SECTION N/A 02/27/2015   Procedure: CESAREAN SECTION;  Surgeon: Newton Pigg, MD;  Location: [facility];  Service: Obstetrics;  Laterality: N/A;  . CESAREAN SECTION N/A 08/03/2018   Procedure: REPEAT CESAREAN SECTION;  Surgeon: Janyth Contes, MD;  Location: [facility];  Service: Obstetrics;  Laterality: N/A;  [name], RNFA  . ERCP N/A 12/02/2020   Procedure: ENDOSCOPIC RETROGRADE CHOLANGIOPANCREATOGRAPHY (ERCP);  Surgeon: Irving Copas., MD;  Location: [facility];  Service: Gastroenterology;  Laterality: N/A;  . ESOPHAGOGASTRODUODENOSCOPY N/A 12/02/2020   Procedure: ESOPHAGOGASTRODUODENOSCOPY (EGD);  Surgeon: Irving Copas., MD;  Location: [facility];  Service: Gastroenterology;  Laterality: N/A;  . FINE NEEDLE ASPIRATION  12/02/2020   Procedure: FINE NEEDLE ASPIRATION (FNA) LINEAR;  Surgeon: Irving Copas., MD;  Location: [facility];  Service: Gastroenterology;;  . UPPER ESOPHAGEAL ENDOSCOPIC ULTRASOUND (EUS) N/A 12/02/2020   Procedure: UPPER ESOPHAGEAL ENDOSCOPIC ULTRASOUND (EUS);  Surgeon: Irving Copas., MD;  Location: [place];  Service: Gastroenterology;  Laterality: N/A;     OB History    Gravida  9   Para  8   Term  8   Preterm      AB      Living  8     SAB      IAB      Ectopic      Multiple  0   Live Births  8  Family History  Problem Relation Age of Onset  . Hypertension Mother   . Hypertension Father     Social History   Tobacco Use  . Smoking status: Current Every Day Smoker    Packs/day: 0.50    Years: 13.00    Pack years: 6.50    Types: Cigarettes  . Smokeless tobacco: Never Used  Vaping Use  . Vaping Use: Never used  Substance Use Topics  . Alcohol use: No    Alcohol/week: 0.0 standard drinks  . Drug use: No    Home Medications Prior to  Admission medications   Medication Sig Start Date End Date Taking? Authorizing Provider  docusate sodium (COLACE) 100 MG capsule Take 1 capsule (100 mg total) by mouth 2 (two) times daily. 12/05/20   Lavina Hamman, MD  Prenatal Multivit-Min-Fe-FA (PRE-NATAL FORMULA) TABS Take 1 tablet by mouth daily. 12/05/20   Lavina Hamman, MD  enoxaparin (LOVENOX) 40 MG/0.4ML injection Inject 40 mg into the skin daily.  09/21/20  [provider]    Allergies    Patient has no known allergies.  Review of Systems   Review of Systems  Constitutional: Negative for chills and fever.  HENT: Negative.   Respiratory: Negative.   Cardiovascular: Negative.   Gastrointestinal: Positive for abdominal pain. Negative for nausea and vomiting.  Musculoskeletal: Negative.   Skin: Negative.   Neurological: Negative.     Physical Exam Updated Vital Signs BP 127/79 (BP Location: Right Arm)   Pulse 79   Temp 98.6 F (37 C) (Oral)   Resp 16   Ht 5\' 5"  (1.651 m)   Wt 66.2 kg   LMP 01/13/2020   SpO2 100%   BMI 24.30 kg/m   Physical Exam Vitals and nursing note reviewed.  Constitutional:      Appearance: She is well-developed.  HENT:     Head: Normocephalic.  Eyes:     General: Scleral icterus present.  Cardiovascular:     Rate and Rhythm: Normal rate and regular rhythm.     Heart sounds: No murmur heard.   Pulmonary:     Effort: Pulmonary effort is normal.     Breath sounds: Normal breath sounds. No wheezing, rhonchi or rales.  Abdominal:     General: Bowel sounds are normal. There is no distension.     Palpations: Abdomen is soft. There is no mass.     Tenderness: There is abdominal tenderness (Moderately tender across upper abdomen. ). There is no guarding or rebound.  Musculoskeletal:        General: Normal range of motion.     Cervical back: Normal range of motion and neck supple.  Skin:    General: Skin is warm and dry.     Findings: No rash.  Neurological:     Mental Status:  She is alert and oriented to person, place, and time.     ED Results / Procedures / Treatments   Labs (all labs ordered are listed, but only abnormal results are displayed) Labs Reviewed  CBC WITH DIFFERENTIAL/PLATELET  COMPREHENSIVE METABOLIC PANEL  LIPASE, BLOOD    EKG None  Radiology No results found.  Procedures Procedures   Medications Ordered in ED Medications - No data to display  ED Course  I have reviewed the triage vital signs and the nursing notes.  Pertinent labs & imaging results that were available during my care of the patient were reviewed by me and considered in my medical decision making (see chart for  details).    MDM Rules/Calculators/A&P                          Patient to ED with uncontrolled abdominal pain, recent diagnosis pancreatic mass, obstructive jaundice, stent placed yesterday at [facility]. No fever. Reports she is [redacted] weeks pregnant. No lower abdominal pain, vaginal bleeding.   Labs rechecked. Total bilirubin down trending, 7.7 3/24, now 6.4. Transaminases stable. Lipase improved. Pain is improved with Morphine.   5:15 - she reports pain is returning. Will transition to oral with Percocet and re-evaluate.   6:15 - She reports Percocet is controlling her pain. She is comfortable with plan of discharge. She has scheduled GI follow up for next week.   Final Clinical Impression(s) / ED Diagnoses Final diagnoses:  None   1. Abdominal pain  Rx / DC Orders ED Discharge Orders    None       Dennie Bible 12/07/20 2552    Luna Fuse, MD 12/11/20 2148

## 2020-12-07 NOTE — ED Triage Notes (Signed)
Pt BIB EMS from home for c/o abdominal pain. Pt had surgery at [facility] 3/25 for a stent from her gallbladder to pancreas, per pt. Called for worsening of abdominal pain. Describes 10/10 throbbing, constant pain. Pt states she is [redacted] weeks pregnant.   EMS vitals:  142/84 RR 18 100% RA 97.2 oral

## 2020-12-07 NOTE — Discharge Instructions (Signed)
Keep your appointment with your gastroenterologist next week. Take Percocet for pain as prescribed.   Return to the emergency department with any new or worsening symptoms - high fever, severe/uncontrolled pain, vomiting or new concern.

## 2020-12-07 NOTE — ED Notes (Signed)
E-signature pad unavailable at time of pt discharge. This RN discussed discharge materials with pt and answered all pt questions. Pt stated understanding of discharge material. ? ?

## 2020-12-10 ENCOUNTER — Inpatient Hospital Stay: Payer: Medicaid Other | Attending: Hematology | Admitting: Hematology

## 2020-12-10 ENCOUNTER — Encounter: Payer: Self-pay | Admitting: Hematology

## 2020-12-10 ENCOUNTER — Other Ambulatory Visit: Payer: Self-pay

## 2020-12-10 DIAGNOSIS — G893 Neoplasm related pain (acute) (chronic): Secondary | ICD-10-CM | POA: Insufficient documentation

## 2020-12-10 DIAGNOSIS — Z86711 Personal history of pulmonary embolism: Secondary | ICD-10-CM | POA: Insufficient documentation

## 2020-12-10 DIAGNOSIS — C259 Malignant neoplasm of pancreas, unspecified: Secondary | ICD-10-CM | POA: Insufficient documentation

## 2020-12-10 DIAGNOSIS — C250 Malignant neoplasm of head of pancreas: Secondary | ICD-10-CM

## 2020-12-10 DIAGNOSIS — O26891 Other specified pregnancy related conditions, first trimester: Secondary | ICD-10-CM | POA: Diagnosis not present

## 2020-12-10 MED ORDER — OXYCODONE HCL 5 MG PO TABS: 5.0000 mg | ORAL_TABLET | ORAL | 0 refills | Status: DC | PRN

## 2020-12-10 NOTE — Progress Notes (Signed)
[place]  [phone] Fax:([phone] [place] Note   Patient Care Team: Inc, [facility] as PCP - General Truitt Merle, MD as Consulting Physician (Oncology) Jonnie Finner, RN as Oncology Nurse Navigator Janyth Contes, MD as Consulting Physician (Obstetrics and Gynecology) 12/10/2020  CHIEF COMPLAINTS/PURPOSE OF CONSULTATION:  Dr. Rush Landmark   HISTORY OF PRESENTING ILLNESS:  Cynthia [name] 40 y.o. female is here because of her newly diagnosed pancreatic cancer.  She presents to the clinic with her [place] today.  She presents with epigastric pain for the past 1 months.  The pain is mainly located in the mid upper abdomen, sometimes involve the right and the left upper abdomen, and radiates to back.  It has been intermittent, worse lately.  She describes the pain level is about 5/10, no significant nausea, vomiting, change of bowel habits.  She presented to emergency room on November 25, 2020, and was admitted for further evaluation.  Lab reviewed transaminitis and elevated total bilirubin 4.4, abdominal MRI without contrast showed masslike area in the head of the pancreas with double duct sign, suspicious for pancreatic neoplasm.  Mild gallbladder wall thickening.  She was seen by GI, ERCP was recommended but the patient left AMA because she was kept on n.p.o. she returned to hospital the next day after she went home due to the worsening abdominal pain, and underwent EUS and biopsy of pancreatic mass, which showed adenocarcinoma.  The tumor was staged as T2N0 by EUS.  ERCP was attempted but failed. She was referred to [facility], and underwent ERCP with successful placement of a metal stent in CBD on 12/06/20.  Patient states her abdominal pain has gotten worse after procedure, she was seen in the ED the day after the procedure for pain, and was discharged home with Percocet.  She has been taking Percocet every 4-6 hours, and is  running out soon.  Of note, pt is pregnant at week 13 weeks now.  This is her ninth pregnancy, and is going well.  She has not seen her OB yet. No significant morning sickness or other complaints.  She states her appetite has been normal, she is eating well at home.  She has lost about 10 pounds in the past few months.  Her energy level is decent, she is able to function when the pain is controlled.  She lives with her fianc and 8 children at home, they are age of 2 to 73.  Patient and her fianc own a Biomedical scientist business.   MEDICAL HISTORY:  Past Medical History:  Diagnosis Date  . History of pulmonary embolism   . Normal pregnancy in multigravida in third trimester 07/18/2018  . Pancreatitis, acute 12/04/2020  . Tobacco abuse     SURGICAL HISTORY: Past Surgical History:  Procedure Laterality Date  . BIOPSY  12/02/2020   Procedure: BIOPSY;  Surgeon: Rush Landmark Telford Nab., MD;  Location: [facility];  Service: Gastroenterology;;  . CESAREAN SECTION    . CESAREAN SECTION N/A 02/27/2015   Procedure: CESAREAN SECTION;  Surgeon: Newton Pigg, MD;  Location: [facility];  Service: Obstetrics;  Laterality: N/A;  . CESAREAN SECTION N/A 08/03/2018   Procedure: REPEAT CESAREAN SECTION;  Surgeon: Janyth Contes, MD;  Location: [employer];  Service: Obstetrics;  Laterality: N/A;  [name], RNFA  . ERCP N/A 12/02/2020   Procedure: ENDOSCOPIC RETROGRADE CHOLANGIOPANCREATOGRAPHY (ERCP);  Surgeon: Irving Copas., MD;  Location: [facility];  Service: Gastroenterology;  Laterality: N/A;  .  ESOPHAGOGASTRODUODENOSCOPY N/A 12/02/2020   Procedure: ESOPHAGOGASTRODUODENOSCOPY (EGD);  Surgeon: Irving Copas., MD;  Location: [facility];  Service: Gastroenterology;  Laterality: N/A;  . FINE NEEDLE ASPIRATION  12/02/2020   Procedure: FINE NEEDLE ASPIRATION (FNA) LINEAR;  Surgeon: Irving Copas., MD;  Location: [place];  Service: Gastroenterology;;  . UPPER  ESOPHAGEAL ENDOSCOPIC ULTRASOUND (EUS) N/A 12/02/2020   Procedure: UPPER ESOPHAGEAL ENDOSCOPIC ULTRASOUND (EUS);  Surgeon: Irving Copas., MD;  Location: [facility];  Service: Gastroenterology;  Laterality: N/A;    SOCIAL HISTORY: Social History   Socioeconomic History  . Marital status: Single    Spouse name: Not on file  . Number of children: 8  . Years of education: Not on file  . Highest education level: Not on file  Occupational History  . Occupation: Biomedical scientist   Tobacco Use  . Smoking status: Current Every Day Smoker    Packs/day: 0.50    Years: 13.00    Pack years: 6.50    Types: Cigarettes  . Smokeless tobacco: Never Used  Vaping Use  . Vaping Use: Never used  Substance and Sexual Activity  . Alcohol use: No    Alcohol/week: 0.0 standard drinks  . Drug use: No  . Sexual activity: Yes  Other Topics Concern  . Not on file  Social History Narrative  . Not on file   Social Determinants of Health   Financial Resource Strain: Not on file  Food Insecurity: Not on file  Transportation Needs: Not on file  Physical Activity: Not on file  Stress: Not on file  Social Connections: Not on file  Intimate Partner Violence: Not on file    FAMILY HISTORY: Family History  Problem Relation Age of Onset  . Hypertension Mother   . Hypertension Father   . Cancer Maternal Grandmother        gastric cancer  . Cancer Paternal Grandfather        lung cancer    ALLERGIES:  has No Known Allergies.  MEDICATIONS:  Current Outpatient Medications  Medication Sig Dispense Refill  . oxyCODONE (OXY IR/ROXICODONE) 5 MG immediate release tablet Take 1 tablet (5 mg total) by mouth every 4 (four) hours as needed for severe pain. 75 tablet 0  . docusate sodium (COLACE) 100 MG capsule Take 1 capsule (100 mg total) by mouth 2 (two) times daily. 10 capsule 0  . oxyCODONE-acetaminophen (PERCOCET/ROXICET) 5-325 MG tablet Take 1 tablet by mouth every 4 (four) hours as needed for  severe pain. 15 tablet 0  . Prenatal Multivit-Min-Fe-FA (PRE-NATAL FORMULA) TABS Take 1 tablet by mouth daily. 30 tablet 0   No current facility-administered medications for this visit.    REVIEW OF SYSTEMS:   Constitutional: Denies fevers, chills or abnormal night sweats Eyes: Denies blurriness of vision, double vision or watery eyes Ears, nose, mouth, throat, and face: Denies mucositis or sore throat Respiratory: Denies cough, dyspnea or wheezes Cardiovascular: Denies palpitation, chest discomfort or lower extremity swelling Gastrointestinal:  Denies nausea, heartburn or change in bowel habits, (+) abdominal pain, see HPI  Skin: Denies abnormal skin rashes Lymphatics: Denies new lymphadenopathy or easy bruising Neurological:Denies numbness, tingling or new weaknesses Behavioral/Psych: Mood is stable, no new changes  All other systems were reviewed with the patient and are negative.  PHYSICAL EXAMINATION: ECOG PERFORMANCE STATUS: 1 - Symptomatic but completely ambulatory  Vitals:   12/10/20 1124  BP: 111/65  Pulse: 79  Resp: 16  Temp: 97.7 F (36.5 C)  SpO2: 100%   Filed  Weights   12/10/20 1124  Weight: 157 lb 9.6 oz (71.5 kg)    GENERAL:alert, no distress and comfortable SKIN: skin color, texture, turgor are normal, no rashes or significant lesions EYES: normal, conjunctiva are pink and non-injected, sclera clear OROPHARYNX:no exudate, no erythema and lips, buccal mucosa, and tongue normal  NECK: supple, thyroid normal size, non-tender, without nodularity LYMPH:  no palpable lymphadenopathy in the cervical, axillary or inguinal LUNGS: clear to auscultation and percussion with normal breathing effort HEART: regular rate & rhythm and no murmurs and no lower extremity edema ABDOMEN:abdomen soft, non-tender and normal bowel sounds Musculoskeletal:no cyanosis of digits and no clubbing  PSYCH: alert & oriented x 3 with fluent speech NEURO: no focal motor/sensory  deficits  LABORATORY DATA:  I have reviewed the data as listed CBC Latest Ref Rng & Units 12/07/2020 12/05/2020 12/04/2020  WBC 4.0 - 10.5 K/uL 8.3 6.1 5.3  Hemoglobin 12.0 - 15.0 g/dL 11.2(L) 9.9(L) 9.5(L)  Hematocrit 36.0 - 46.0 % 30.3(L) 27.0(L) 26.0(L)  Platelets 150 - 400 K/uL 360 236 296   CMP Latest Ref Rng & Units 12/07/2020 12/05/2020 12/04/2020  Glucose 70 - 99 mg/dL 97 85 90  BUN 6 - 20 mg/dL <5(L) <5(L) <5(L)  Creatinine 0.44 - 1.00 mg/dL 0.53 0.46 0.52  Sodium 135 - 145 mmol/L 133(L) 134(L) 136  Potassium 3.5 - 5.1 mmol/L 3.6 3.7 3.2(L)  Chloride 98 - 111 mmol/L 104 106 106  CO2 22 - 32 mmol/L 22 22 23   Calcium 8.9 - 10.3 mg/dL 8.9 9.0 8.6(L)  Total Protein 6.5 - 8.1 g/dL 6.4(L) 5.4(L) 5.1(L)  Total Bilirubin 0.3 - 1.2 mg/dL 5.0(H) 7.7(H) 7.4(H)  Alkaline Phos 38 - 126 U/L 693(H) 545(H) 506(H)  AST 15 - 41 U/L 293(H) 279(H) 278(H)  ALT 0 - 44 U/L 276(H) 249(H) 244(H)    RADIOGRAPHIC STUDIES: I have personally reviewed the radiological images as listed and agreed with the findings in the report. US OB Comp < 14 Wks  Result Date: 11/25/2020 CLINICAL DATA:  Pregnancy. EXAM: OBSTETRIC <14 WK ULTRASOUND TECHNIQUE: Transabdominal ultrasound was performed for evaluation of the gestation as well as the maternal uterus and adnexal regions. COMPARISON:  None. FINDINGS: Intrauterine gestational sac: Single Yolk sac:  Visualized. Embryo:  Visualized. Cardiac Activity: Visualized. Heart Rate: 158 bpm CRL:   43.3 mm   11 w 1 d                  [place] EDC: June 15, 2021. Subchorionic hemorrhage:  None visualized. Maternal uterus/adnexae: Ovaries are unremarkable. No free fluid is noted. IMPRESSION: Single live intrauterine gestation of 11 weeks 1 day. Electronically Signed   By: Marijo Conception M.D.   On: 11/25/2020 09:54   MR ABDOMEN MRCP WO CONTRAST  Addendum Date: 11/26/2020   ADDENDUM REPORT: 11/26/2020 14:18 ADDENDUM: These results were called by telephone at the time of interpretation  on 11/26/2020 at 2:17 pm to provider [facility] , who verbally acknowledged these results. Other findings relate by the radiology assistant as outlined in the Z vision dash board. Electronically Signed   By: Zetta Bills M.D.   On: 11/26/2020 14:18   Result Date: 11/26/2020 CLINICAL DATA:  RIGHT upper quadrant abdominal pain. Nondiagnostic ultrasound. EXAM: MRI ABDOMEN WITHOUT CONTRAST  (INCLUDING MRCP) TECHNIQUE: Multiplanar multisequence MR imaging of the abdomen was performed. Heavily T2-weighted images of the biliary and pancreatic ducts were obtained, and three-dimensional MRCP images were rendered by post processing. COMPARISON:  Ultrasound evaluation of  the same date showing dilated appearance of the biliary tree. FINDINGS: Lower chest: No consolidation. No pleural effusion noted at the lung bases. Hepatobiliary: Diffuse intra and extrahepatic biliary duct distension with abrupt termination in the pancreatic portion of the common bile duct. No pericholecystic fluid. Wall thickness approximately 3 mm. No filling defect in the common bile duct to suggest choledocholithiasis. Sludge in the gallbladder lumen. Low insertion of posterior division RIGHT hepatic ducts upon the biliary tree seen passing into the hepatic duct approximately 8 mm above the cystic duct. Pancreas: Irregular pancreatic ductal distension. Loss of normal intrinsic T1 signal focally in the pancreatic head with masslike appearance, this corresponds to intermediate T2 signal. No area of restricted diffusion in this location. This area measures 2.0 x 1.9 cm.  Mild peripancreatic stranding. Spleen:  Normal size and contour.  No focal splenic lesion. Adrenals/Urinary Tract:  Adrenal glands are normal.  The No sign of hydronephrosis.  Smooth renal contours. Stomach/Bowel: No acute gastrointestinal process to the extent evaluated. Vascular/Lymphatic: Limited assessment on noncontrast imaging. No adenopathy. No signs of aneurysmal dilation of  the abdominal aorta. Other:  No ascites. Musculoskeletal: No suspicious bone lesions identified. IMPRESSION: 1. Double duct sign with masslike area in the head of the pancreas, suspicious for pancreatic neoplasm. It is possible that if there is a history of repeated pancreatitis is could represent sequela of groove pancreatitis and stricture though the abrupt termination has a more malignant rather than benign appearance. CT with intravenous contrast of the abdomen only may be helpful for further evaluation to exclude the possibility of pancreatic mass and guide further evaluation such as endoscopic ultrasound or ERCP. 2. Pancreatic ductal distension and mild relative atrophy. 3. Mild gallbladder wall thickening with mild diffusion abnormality associated with the gallbladder wall. Findings are nonspecific and could even be related to chronic cholecystitis in the setting of biliary obstruction, equivocal for acute biliary pathology. Contrasted imaging could also be helpful to assess for signs of acute inflammation 4. Mild peripancreatic stranding may be related to an overlay of acute mild interstitial edematous pancreatitis. 5. Biliary ductal variant with posterior RIGHT hepatic ducts draining just above the cystic duct confluence with the common bile duct. 6. Sludge in the gallbladder lumen. 7. Mild peripancreatic stranding. A call is out to the referring provider to further discuss findings in the above case. Electronically Signed: By: Zetta Bills M.D. On: 11/26/2020 13:53   MR 3D Recon At Scanner  Addendum Date: 11/26/2020   ADDENDUM REPORT: 11/26/2020 14:18 ADDENDUM: These results were called by telephone at the time of interpretation on 11/26/2020 at 2:17 pm to provider [facility] , who verbally acknowledged these results. Other findings relate by the radiology assistant as outlined in the Z vision dash board. Electronically Signed   By: Zetta Bills M.D.   On: 11/26/2020 14:18   Result Date:  11/26/2020 CLINICAL DATA:  RIGHT upper quadrant abdominal pain. Nondiagnostic ultrasound. EXAM: MRI ABDOMEN WITHOUT CONTRAST  (INCLUDING MRCP) TECHNIQUE: Multiplanar multisequence MR imaging of the abdomen was performed. Heavily T2-weighted images of the biliary and pancreatic ducts were obtained, and three-dimensional MRCP images were rendered by post processing. COMPARISON:  Ultrasound evaluation of the same date showing dilated appearance of the biliary tree. FINDINGS: Lower chest: No consolidation. No pleural effusion noted at the lung bases. Hepatobiliary: Diffuse intra and extrahepatic biliary duct distension with abrupt termination in the pancreatic portion of the common bile duct. No pericholecystic fluid. Wall thickness approximately 3 mm. No filling defect in  the common bile duct to suggest choledocholithiasis. Sludge in the gallbladder lumen. Low insertion of posterior division RIGHT hepatic ducts upon the biliary tree seen passing into the hepatic duct approximately 8 mm above the cystic duct. Pancreas: Irregular pancreatic ductal distension. Loss of normal intrinsic T1 signal focally in the pancreatic head with masslike appearance, this corresponds to intermediate T2 signal. No area of restricted diffusion in this location. This area measures 2.0 x 1.9 cm.  Mild peripancreatic stranding. Spleen:  Normal size and contour.  No focal splenic lesion. Adrenals/Urinary Tract:  Adrenal glands are normal.  The No sign of hydronephrosis.  Smooth renal contours. Stomach/Bowel: No acute gastrointestinal process to the extent evaluated. Vascular/Lymphatic: Limited assessment on noncontrast imaging. No adenopathy. No signs of aneurysmal dilation of the abdominal aorta. Other:  No ascites. Musculoskeletal: No suspicious bone lesions identified. IMPRESSION: 1. Double duct sign with masslike area in the head of the pancreas, suspicious for pancreatic neoplasm. It is possible that if there is a history of repeated  pancreatitis is could represent sequela of groove pancreatitis and stricture though the abrupt termination has a more malignant rather than benign appearance. CT with intravenous contrast of the abdomen only may be helpful for further evaluation to exclude the possibility of pancreatic mass and guide further evaluation such as endoscopic ultrasound or ERCP. 2. Pancreatic ductal distension and mild relative atrophy. 3. Mild gallbladder wall thickening with mild diffusion abnormality associated with the gallbladder wall. Findings are nonspecific and could even be related to chronic cholecystitis in the setting of biliary obstruction, equivocal for acute biliary pathology. Contrasted imaging could also be helpful to assess for signs of acute inflammation 4. Mild peripancreatic stranding may be related to an overlay of acute mild interstitial edematous pancreatitis. 5. Biliary ductal variant with posterior RIGHT hepatic ducts draining just above the cystic duct confluence with the common bile duct. 6. Sludge in the gallbladder lumen. 7. Mild peripancreatic stranding. A call is out to the referring provider to further discuss findings in the above case. Electronically Signed: By: Zetta Bills M.D. On: 11/26/2020 13:53   DG C-Arm 1-60 Min-No Report  Result Date: 12/02/2020 Fluoroscopy was utilized by the requesting physician.  No radiographic interpretation.   US Abdomen Limited RUQ (LIVER/GB)  Result Date: 11/25/2020 CLINICAL DATA:  39 year old female with right upper quadrant pain for several weeks. EXAM: ULTRASOUND ABDOMEN LIMITED RIGHT UPPER QUADRANT COMPARISON:  CTA chest 05/25/2015. FINDINGS: Gallbladder: Gallbladder lumen appears filled with echogenic sludge, possibly with punctate floating stones (image 10). Wall thickness remains normal. No pericholecystic fluid. Equivocal sonographic Murphy sign. Common bile duct: Diameter: Up to 12 mm, dilated. No filling defect identified in the visible duct (image  21). Liver: I do suspect a degree of intrahepatic biliary ductal dilatation. No discrete liver lesion identified. Liver echogenicity at the upper limits of normal. Portal vein is patent on color Doppler imaging with normal direction of blood flow towards the liver. Other: Negative visible right kidney.  No free fluid. IMPRESSION: Gallbladder distended with sludge, possibly with floating punctate stones. No gallbladder wall thickening but dilated common bile duct and suspicion of early intrahepatic biliary ductal dilatation. This constellation suggests Choledocholithiasis. Recommend further evaluation. Electronically Signed   By: Genevie Ann M.D.   On: 11/25/2020 07:54    ASSESSMENT & PLAN: 40 yo female G9P8 with pregnancy at week of 13, presented with abdominal pain and jaundice  1.  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 -This appears to be a early stage pancreatic cancer in the head of pancreas.  No clear vascular involvement or adenopathy by EUS.  Her MRI was done without contrast, which limits the evaluation of vascular involvement. -I reviewed the natural history of pancreatic cancer, and high risk of recurrence after surgery.  -I discussed the role of neoadjuvant or adjuvant chemotherapy, to reduce her risk of recurrence. -Due to her current pregnancy, I am not able to offer chemotherapy if she plans to continue the pregnancy. -I recommend upfront surgery, and will refer her to Dr. Barry Dienes or Dr. Zenia Resides.  Her case will be discussed in our GI tumor board tomorrow. -Also CT chest is routine as a staging scan, it is probably contraindicated due to her current pregnancy.  -will repeat her lab and CA19.9, which was elevated but could be related to biliary obstruction.  2. Pregnancy  -She is currently pregnant at week of 55, she has  called her OB, but has not been seen yet. -I will reach out to her OB Dr. Melba Coon -pt has not decided if she wants to continue her pregnancy which may impact her pancreatic cancer treatment   3. History of PE -Per patient, it was related to her oral contraceptives. -She is not on anticoagulation -We discussed high risk of thrombosis from pancreatic cancer, and I encouraged her to stay active.  4.abdominal pain -Secondary to pancreatic cancer -I called in oxycodone 5 mg every 4-6 hours as needed.  Side effects of constipation, sedation, etc. were discussed with her.  I also reviewed drug dependence and risk of overdose, she voiced good understanding     Orders Placed This Encounter  Procedures  . Ambulatory referral to General Surgery    Referral Priority:   Urgent    Referral Type:   Surgical    Referral Reason:   Specialty Services Required    Requested Specialty:   General Surgery    Number of Visits Requested:   1    All questions were answered. The patient knows to call the clinic with any problems, questions or concerns. I spent 50 minutes counseling the patient face to face. The total time spent in the appointment was 60 minutes and more than 50% was on counseling.     Truitt Merle, MD 12/10/2020 7:22 PM

## 2020-12-11 ENCOUNTER — Other Ambulatory Visit: Payer: Self-pay

## 2020-12-11 ENCOUNTER — Telehealth: Payer: Self-pay | Admitting: Hematology

## 2020-12-11 ENCOUNTER — Encounter: Payer: Self-pay | Admitting: Gastroenterology

## 2020-12-11 ENCOUNTER — Telehealth: Payer: Self-pay

## 2020-12-11 DIAGNOSIS — K859 Acute pancreatitis without necrosis or infection, unspecified: Secondary | ICD-10-CM

## 2020-12-11 DIAGNOSIS — C250 Malignant neoplasm of head of pancreas: Secondary | ICD-10-CM

## 2020-12-11 NOTE — Telephone Encounter (Signed)
The patient is aware of her upcoming appointment at [facility] with Dr. Zenia Resides on 12/18/2020 at 4:00

## 2020-12-11 NOTE — Progress Notes (Signed)
The proposed treatment discussed in conference is for discussion purposes only and is not a binding recommendation.  The patients have not been physically examined, or presented with their treatment options.  Therefore, final treatment plans cannot be decided.   

## 2020-12-11 NOTE — Progress Notes (Signed)
I spoke to the patient regarding outcome of Tumor Board discussion this morning.  It is recommended that we obtain a CT chest and CT abdomen not pelvis and the pelvis would be shielded.  She is in agreement to proceed with this.   I also discussed having Genetic Testing and she has agreed.  I have placed a referral for Genetics.    I have also left Dr. Melba Coon (her OB-GYN) a message to call Dr. Burr Medico on her cell phone.

## 2020-12-11 NOTE — Telephone Encounter (Signed)
Scheduled appt per 3/30 sch msg. Pt aware.  

## 2020-12-11 NOTE — Telephone Encounter (Signed)
Checked out appointment. No LOS notes needing to be scheduled. No changes made. 

## 2020-12-11 NOTE — Progress Notes (Signed)
Met with patient and her significant other at her initial medical oncology consult with Dr. Truitt Merle.  I explained my role as nurse navigator and they were given my direct contact information.  I have encouraged them to call with any questions or concerns.  They verbalized an understanding of the plan of care outlined today by Dr. Burr Medico.

## 2020-12-12 ENCOUNTER — Other Ambulatory Visit: Payer: Self-pay

## 2020-12-12 DIAGNOSIS — C250 Malignant neoplasm of head of pancreas: Secondary | ICD-10-CM

## 2020-12-17 ENCOUNTER — Telehealth: Payer: Self-pay

## 2020-12-17 NOTE — Telephone Encounter (Signed)
Elayah left vm she wanted to speak with Dr Burr Medico.  I returned her call to let her know Dr Burr Medico was not in today.  I asked if there was anything I could help her with and she declined.  She just wants to speak with Dr Burr Medico

## 2020-12-19 ENCOUNTER — Inpatient Hospital Stay: Payer: Medicaid Other

## 2020-12-19 ENCOUNTER — Inpatient Hospital Stay: Payer: Medicaid Other | Attending: Hematology | Admitting: Genetic Counselor

## 2020-12-19 ENCOUNTER — Other Ambulatory Visit: Payer: Self-pay | Admitting: Genetic Counselor

## 2020-12-19 ENCOUNTER — Other Ambulatory Visit: Payer: Self-pay

## 2020-12-19 DIAGNOSIS — C250 Malignant neoplasm of head of pancreas: Secondary | ICD-10-CM

## 2020-12-19 DIAGNOSIS — Z800 Family history of malignant neoplasm of digestive organs: Secondary | ICD-10-CM

## 2020-12-19 DIAGNOSIS — Z801 Family history of malignant neoplasm of trachea, bronchus and lung: Secondary | ICD-10-CM | POA: Diagnosis not present

## 2020-12-20 ENCOUNTER — Ambulatory Visit (HOSPITAL_COMMUNITY): Admission: RE | Admit: 2020-12-20 | Payer: Medicaid Other | Source: Ambulatory Visit

## 2020-12-20 LAB — GENETIC SCREENING ORDER

## 2020-12-20 NOTE — Progress Notes (Signed)
Received email from [facility] referral coordinator that she had tried patient multiple times no answer and has no voice mail set up.  She asked for alternative contact numbers and she was provided with patient's mother's phone number and her significant others phone number.    Further communication with Carolanne Grumbling she has been unable to reach patient's significant other - no answer and no voice mail set up.  Also she has tried the patient's mother whose phone and gets a fast busy signal.    She states she will continue to try to reach to the patient.  Dr. Burr Medico is aware of the situation.

## 2020-12-20 NOTE — Progress Notes (Signed)
I sent high priority email to Cynthia Hives RN AD Dept of Surgery at [place] - Oncology Referral Coordinator with information regarding this patient's condition approximately [redacted] weeks pregnant newly diagnosed Pancreatic Cancer.  She has been evaluated by Dr. Michaelle Birks here and felt more appropriate referral for [facility] Surgical Team.  They are in agreement to reach out to the patient to initially do video visit with Dr. Zenia Resides and then bring her in person to [facility] next week.

## 2020-12-23 ENCOUNTER — Encounter: Payer: Self-pay | Admitting: Genetic Counselor

## 2020-12-23 DIAGNOSIS — Z801 Family history of malignant neoplasm of trachea, bronchus and lung: Secondary | ICD-10-CM | POA: Insufficient documentation

## 2020-12-23 DIAGNOSIS — Z800 Family history of malignant neoplasm of digestive organs: Secondary | ICD-10-CM | POA: Insufficient documentation

## 2020-12-23 NOTE — Progress Notes (Signed)
REFERRING PROVIDER: Truitt Merle, MD [place],  [place] [address]  PRIMARY PROVIDER:  [place]  PRIMARY REASON FOR VISIT:  1. Malignant neoplasm of head of pancreas ([place])   2. Family history of gastric cancer   3. Family history of lung cancer     HISTORY OF PRESENT ILLNESS:   Ms. Coderre, a 40 y.o. female, was seen for a [facility] cancer genetics consultation at the request of Dr. Burr Medico due to a personal history of cancer.  Ms. Barbour presents to clinic today to discuss the possibility of a hereditary predisposition to cancer, to discuss genetic testing, and to further clarify her future cancer risks, as well as potential cancer risks for family members.   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 The treatment plan is pending.    CANCER HISTORY:  Oncology History  Pancreatic cancer ([place])  12/02/2020 Cancer Staging   Staging form: Exocrine Pancreas, AJCC 8th Edition - Clinical stage from 12/02/2020: Stage IB (cT2, cN0, cM0) - Signed by Truitt Merle, MD on 12/10/2020 Stage prefix: Initial diagnosis Total positive nodes: 0   12/10/2020 Initial Diagnosis   Pancreatic cancer ([place])     RISK FACTORS:  Menarche was at age 68.  First live birth at age 12.  OCP use for approximately 2 months.  Ovaries intact: yes.  Hysterectomy: no.  Menopausal status: premenopausal.  HRT use: 0 years. Colonoscopy: no; not examined. Mammogram within the last year: no. Number of breast biopsies: 0.   Past Medical History:  Diagnosis Date  . History of pulmonary embolism   . Normal pregnancy in multigravida in third trimester 07/18/2018  . Pancreatitis, acute 12/04/2020  . Tobacco abuse     Past Surgical History:  Procedure Laterality Date  . BIOPSY  12/02/2020   Procedure: BIOPSY;  Surgeon: Rush Landmark Telford Nab., MD;  Location: [facility];  Service: Gastroenterology;;  . CESAREAN SECTION     . CESAREAN SECTION N/A 02/27/2015   Procedure: CESAREAN SECTION;  Surgeon: Newton Pigg, MD;  Location: [facility];  Service: Obstetrics;  Laterality: N/A;  . CESAREAN SECTION N/A 08/03/2018   Procedure: REPEAT CESAREAN SECTION;  Surgeon: Janyth Contes, MD;  Location: [facility];  Service: Obstetrics;  Laterality: N/A;  [name], RNFA  . ERCP N/A 12/02/2020   Procedure: ENDOSCOPIC RETROGRADE CHOLANGIOPANCREATOGRAPHY (ERCP);  Surgeon: Irving Copas., MD;  Location: [facility];  Service: Gastroenterology;  Laterality: N/A;  . ESOPHAGOGASTRODUODENOSCOPY N/A 12/02/2020   Procedure: ESOPHAGOGASTRODUODENOSCOPY (EGD);  Surgeon: Irving Copas., MD;  Location: [facility];  Service: Gastroenterology;  Laterality: N/A;  . FINE NEEDLE ASPIRATION  12/02/2020   Procedure: FINE NEEDLE ASPIRATION (FNA) LINEAR;  Surgeon: Irving Copas., MD;  Location: [facility];  Service: Gastroenterology;;  . UPPER ESOPHAGEAL ENDOSCOPIC ULTRASOUND (EUS) N/A 12/02/2020   Procedure: UPPER ESOPHAGEAL ENDOSCOPIC ULTRASOUND (EUS);  Surgeon: Irving Copas., MD;  Location: [facility];  Service: Gastroenterology;  Laterality: N/A;    Social History   Socioeconomic History  . Marital status: Single    Spouse name: Not on file  . Number of children: 8  . Years of education: Not on file  . Highest education level: Not on file  Occupational History  . Occupation: Biomedical scientist   Tobacco Use  . Smoking status: Current Every Day Smoker    Packs/day: 0.50    Years: 13.00    Pack years: 6.50  Types: Cigarettes  . Smokeless tobacco: Never Used  Vaping Use  . Vaping Use: Never used  Substance and Sexual Activity  . Alcohol use: No    Alcohol/week: 0.0 standard drinks  . Drug use: No  . Sexual activity: Yes  Other Topics Concern  . Not on file  Social History Narrative  . Not on file   Social Determinants of Health   Financial Resource Strain: Not on file  Food  Insecurity: Not on file  Transportation Needs: Not on file  Physical Activity: Not on file  Stress: Not on file  Social Connections: Not on file     FAMILY HISTORY:  We obtained a detailed, 4-generation family history.  Significant diagnoses are listed below: Family History  Problem Relation Age of Onset  . Gastric cancer Maternal Grandmother        dx unknown age  . Lung cancer Paternal Grandfather        dx after 6; smoking hx    Ms. Cribb has eight children, ages 43-20.  She has seven full siblings and two half sisters, none of whom have a cancer history.  Her mother is 62 without cancer.  Ms. Manolis reported stomach or other type of cancer in his maternal grandmother dx at an unknown age.  Ms. Vezina father is 64 without cancer.  Ms. Mcclatchey paternal grandfather had lung cancer diagnosed after the age of 39.   Ms. Lamboy is unaware of previous family history of genetic testing for hereditary cancer risks. There is no reported Ashkenazi Jewish ancestry. There is no known consanguinity.  GENETIC COUNSELING ASSESSMENT: Ms. Matton is a 40 y.o. female with a personal history of cancer which is somewhat suggestive of a hereditary cancer syndrome and predisposition to cancer given her diagnosis of pancreatic cancer and her age of diagnoiss. We, therefore, discussed and recommended the following at today's visit.   DISCUSSION: We discussed that 5 - 10% of cancer is hereditary, with many cases of hereditary pancreatic associated with mutations in BRCA1/2.  There are other genes that can be associated with hereditary pancreatic cancer syndromes.  Type of cancer risk and level of risk are gene-specific.  We discussed that testing is beneficial for several reasons including knowing how to follow individuals for their cancer risks, identifying whether potential treatment options such as PARP inhibitors would be beneficial, and understanding if other family members could be at risk for cancer  and allowing them to undergo genetic testing.   We reviewed the characteristics, features and inheritance patterns of hereditary cancer syndromes. We also discussed genetic testing, including the appropriate family members to test, the process of testing, insurance coverage and turn-around-time for results. We discussed the implications of a negative, positive, carrier and/or variant of uncertain significant result. We recommended Ms. Hutchins pursue genetic testing for a panel that includes genes associated with pancreatic cancer and pancreatitis.   The CustomNext-Cancer +RNAinsight gene panel offered by [facility] and includes sequencing and rearrangement analysis for the following 91 genes: AIP, ALK, APC, ATM, AXIN2, BAP1, BARD1, BLM, BMPR1A, BRCA1, BRCA2, BRIP1, CDC73, CDH1, CDK4, CDKN1B, CDKN2A, CHEK2, CTNNA1, DICER1, FANCC, FH, FLCN, GALNT12, KIF1B, LZTR1, MAX, MEN1, MET, MLH1, MRE11A, MSH2, MSH3, MSH6, MUTYH, NBN, NF1, NF2, NTHL1, PALB2, PHOX2B, PMS2, POT1, PRKAR1A, PTCH1, PTEN*, RAD50, RAD51C, RAD51D, RB1, RECQL, RET, SDHA, SDHAF2, SDHB, SDHC, SDHD, SMAD4, SMARCA4, SMARCB1, SMARCE1, STK11, SUFU, TMEM127, TP53, TSC1, TSC2, VHL and XRCC2 (sequencing and deletion/duplication); CASR, CFTR, CPA1, CTRC, EGFR, EGLN1, FAM175A, HOXB13, KIT, MITF, MLH3,  PALLD, PDGFRA, POLD1, POLE, PRSS1, RINT1, RPS20, SPINK1 and TERT (sequencing only); EPCAM and GREM1 (deletion/duplication only). DNA and RNA analyses performed.   Based on Ms. Soto's personal history of pancreatic cancer, she meets medical criteria for genetic testing. Despite that she meets criteria, she may still have an out of pocket cost.   PLAN: After considering the risks, benefits, and limitations, Ms. Fischbach provided informed consent to pursue genetic testing and the blood sample was sent to [facility] for analysis of the CustomNext-Cancer +RNAinsight Panel. Results should be available within approximately 3 weeks' time, at which point  they will be disclosed by telephone to Ms. Penix, as will any additional recommendations warranted by these results. Ms. Harmon will receive a summary of her genetic counseling visit and a copy of her results once available. This information will also be available in Epic.   Lastly, we encouraged Ms. Hendler to remain in contact with cancer genetics annually so that we can continuously update the family history and inform her of any changes in cancer genetics and testing that may be of benefit for this family.   Ms. Chiem questions were answered to her satisfaction today. Our contact information was provided should additional questions or concerns arise. Thank you for the referral and allowing [place] to share in the care of your patient.   Kairee Isa M. Joette Catching, [place], [facility] Genetic Counselor [email or web address] (P) [phone]  The patient was seen for a total of 30 minutes in face-to-face genetic counseling.  Drs. Magrinat, Lindi Adie and/or Burr Medico were available to discuss this case as needed.    _______________________________________________________________________ For Office Staff:  Number of people involved in session: 1 Was an Intern/ student involved with case: no

## 2020-12-25 ENCOUNTER — Telehealth: Payer: Self-pay

## 2020-12-25 NOTE — Telephone Encounter (Signed)
Received e-mail from Carolanne Grumbling - Referral Coordinator [facility] that patient did call her and that she is set up for video visit with Dr. Shon Hough on Friday 4/15 at 8:00 am.

## 2020-12-25 NOTE — Telephone Encounter (Signed)
Spoke with patient's mother and asked her to relay a message to patient to have her call me on my direct line.  I expressed my concern that no one has been able to reach her.  She states she will get the message to her daughter.

## 2020-12-25 NOTE — Progress Notes (Signed)
Attempted to call patient regarding Dr. Collier Salina Allen's office at [facility] trying to reach her.  The call was not answered and no voice mail box is set up.  I made Dr. Burr Medico aware.

## 2020-12-25 NOTE — Progress Notes (Signed)
Patient returned my call.  I explained that Cynthia [name] at [facility] has been trying to reach her to set up an appointment with Cynthia [name] (surgeon) regarding her pancreatic cancer.  I have given her that direct number to call this morning.  I explained to her that Cynthia [name] would at least be able to explain to her what her options are as far as surgery.  She states she is feeling fine only having mild constipation.  Also explained that her cancer left untreated will continue to grow and this is complicated by her pregnancy.  We have great difficulty reaching the patient by phone so she has agreed for [place] to e-mail her at the e-mail address in the chart.  She verbalized an understanding of all that has been stated.

## 2021-01-10 ENCOUNTER — Telehealth: Payer: Self-pay

## 2021-01-10 NOTE — Telephone Encounter (Signed)
Ms Edgington called requesting refill for oxycodone Per Dr Burr Medico she will not refill oxycodone as patient has transferred care to [facility].  Pt notified and verbalized understanding

## 2021-01-14 ENCOUNTER — Encounter: Payer: Self-pay | Admitting: Genetic Counselor

## 2021-01-14 ENCOUNTER — Ambulatory Visit: Payer: Self-pay | Admitting: Genetic Counselor

## 2021-01-14 ENCOUNTER — Telehealth: Payer: Self-pay | Admitting: Genetic Counselor

## 2021-01-14 DIAGNOSIS — Z801 Family history of malignant neoplasm of trachea, bronchus and lung: Secondary | ICD-10-CM

## 2021-01-14 DIAGNOSIS — C250 Malignant neoplasm of head of pancreas: Secondary | ICD-10-CM

## 2021-01-14 DIAGNOSIS — Z1379 Encounter for other screening for genetic and chromosomal anomalies: Secondary | ICD-10-CM | POA: Insufficient documentation

## 2021-01-14 DIAGNOSIS — Z800 Family history of malignant neoplasm of digestive organs: Secondary | ICD-10-CM

## 2021-01-14 NOTE — Telephone Encounter (Signed)
Revealed negative genetic testing and uncertain results in BLM and CFTR.  Discussed that we do not know why she has pancreatic cancer. It could be familial, due to a different gene that we are not testing, or maybe our current technology may not be able to pick something up.  It will be important for her to keep in contact with genetics to keep up with whether additional testing may be needed.

## 2021-01-14 NOTE — Progress Notes (Signed)
HPI:  Ms. Antunes was previously seen in the [facility] clinic due to a personal history of pancreatic cancer and concerns regarding a hereditary predisposition to cancer. Please refer to our prior cancer genetics clinic note for more information regarding our discussion, assessment and recommendations, at the time. Ms. Saari recent genetic test results were disclosed to her, as were recommendations warranted by these results. These results and recommendations are discussed in more detail below.  CANCER HISTORY:  Oncology History  Pancreatic cancer ([place])  12/02/2020 Cancer Staging   Staging form: Exocrine Pancreas, AJCC 8th Edition - Clinical stage from 12/02/2020: Stage IB (cT2, cN0, cM0) - Signed by Truitt Merle, MD on 12/10/2020 Stage prefix: Initial diagnosis Total positive nodes: 0   12/10/2020 Initial Diagnosis   Pancreatic cancer ([place])   01/07/2021 Genetic Testing   Negative hereditary cancer genetic testing: no pathogenic variants detected in [facility] CustomNext-Cancer +RNAinsight Panel.  Variant of uncertain significance detected at [facility] p.T1015I and CFTR at p.R74W (homozygous) and p.D1270N (homozygous).  The report date is January 07, 2021.   The CustomNext-Cancer gene panel offered by [facility] and includes sequencing, RNA, and rearrangement analysis for the following 91 genes: AIP, ALK, APC*, ATM*, AXIN2, BAP1, BARD1, BLM, BMPR1A, BRCA1*, BRCA2*, BRIP1*, CDC73, CDH1*, CDK4, CDKN1B, CDKN2A, CHEK2*, CTNNA1, DICER1, FANCC, FH, FLCN, GALNT12, KIF1B, LZTR1, MAX, MEN1, MET, MLH1*, MRE11A, MSH2*, MSH3, MSH6*, MUTYH*, NBN, NF1*, NF2, NTHL1, PALB2*, PHOX2B, PMS2*, POT1, PRKAR1A, PTCH1, PTEN*, RAD50, RAD51C*, RAD51D*, RB1, RECQL, RET, SDHA, SDHAF2, SDHB, SDHC, SDHD, SMAD4, SMARCA4, SMARCB1, SMARCE1, STK11, SUFU, TMEM127, TP53*, TSC1, TSC2, VHL and XRCC2 (sequencing and deletion/duplication); CASR, CFTR, CPA1, CTRC, EGFR, EGLN1, FAM175A, HOXB13, KIT, MITF, MLH3, PALLD, PDGFRA, POLD1,  POLE, PRSS1, RINT1, RPS20, SPINK1 and TERT (sequencing only); EPCAM and GREM1 (deletion/duplication only).      FAMILY HISTORY:  We obtained a detailed, 4-generation family history.  Significant diagnoses are listed below: Family History  Problem Relation Age of Onset  . Hypertension Mother   . Hypertension Father   . Gastric cancer Maternal Grandmother        dx unknown age  . Lung cancer Paternal Grandfather        dx after 10; smoking hx    Ms. Packard has eight children, ages 81-20.  She has seven full siblings and two half sisters, none of whom have a cancer history.  Her mother is 60 without cancer.  Ms. Cummings reported stomach or other type of cancer in his maternal grandmother dx at an unknown age.  Ms. Canaday father is 40 without cancer.  Ms. Preece paternal grandfather had lung cancer diagnosed after the age of 105.   Ms. Alcaraz is unaware of previous family history of genetic testing for hereditary cancer risks. There is no reported Ashkenazi Jewish ancestry. There is no known consanguinity.   GENETIC TEST RESULTS: Genetic testing reported out on January 07, 2021.  The [facility] CustomNext-Cancer +RNAinsight Panel found no pathogenic mutations. The CustomNext-Cancer gene panel offered by [facility] and includes sequencing, RNA, and rearrangement analysis for the following 91 genes: AIP, ALK, APC*, ATM*, AXIN2, BAP1, BARD1, BLM, BMPR1A, BRCA1*, BRCA2*, BRIP1*, CDC73, CDH1*, CDK4, CDKN1B, CDKN2A, CHEK2*, CTNNA1, DICER1, FANCC, FH, FLCN, GALNT12, KIF1B, LZTR1, MAX, MEN1, MET, MLH1*, MRE11A, MSH2*, MSH3, MSH6*, MUTYH*, NBN, NF1*, NF2, NTHL1, PALB2*, PHOX2B, PMS2*, POT1, PRKAR1A, PTCH1, PTEN*, RAD50, RAD51C*, RAD51D*, RB1, RECQL, RET, SDHA, SDHAF2, SDHB, SDHC, SDHD, SMAD4, SMARCA4, SMARCB1, SMARCE1, STK11, SUFU, TMEM127, TP53*, TSC1, TSC2, VHL and XRCC2 (sequencing and deletion/duplication);  CASR, CFTR, CPA1, CTRC, EGFR, EGLN1, FAM175A, HOXB13, KIT, MITF, MLH3, PALLD, PDGFRA, POLD1,  POLE, PRSS1, RINT1, RPS20, SPINK1 and TERT (sequencing only); EPCAM and GREM1 (deletion/duplication only).  The test report has been scanned into EPIC and is located under the Molecular Pathology section of the Results Review tab.  A portion of the result report is included below for reference.     We discussed with Ms. Christine that because current genetic testing is not perfect, it is possible there may be a gene mutation in one of these genes that current testing cannot detect, but that chance is small.  We also discussed, that there could be another gene that has not yet been discovered, or that we have not yet tested, that is responsible for the cancer diagnoses in the family. It is also possible there is a hereditary cause for the cancer in the family that Ms. Lueck did not inherit and therefore was not identified in her testing.  Therefore, it is important to remain in touch with cancer genetics in the future so that we can continue to offer Ms. Hinojosa the most up to date genetic testing.   Genetic testing did identify three variants of uncertain significance (VUS) - one in the BLM gene called p.T1015I, a second in the CFTR gene called  P.R74W (homozygous), and a third in the CFTR gene called p.D1270N (homozyous).  At this time, it is unknown if these variants are associated with increased cancer risk or if they are normal findings, but most variants such as these get reclassified to being inconsequential. They should not be used to make medical management decisions. With time, we suspect the lab will determine the significance of these variants, if any. If we do learn more about them, we will try to contact Ms. Markovitz to discuss it further. However, it is important to stay in touch with [place] periodically and keep the address and phone number up to date.  ADDITIONAL GENETIC TESTING: We discussed with Ms. Jarema that her genetic testing was fairly extensive.  If there are genes identified to increase  cancer risk that can be analyzed in the future, we would be happy to discuss and coordinate this testing at that time.    CANCER SCREENING RECOMMENDATIONS: Ms. Balcom test result is considered negative (normal).  This means that we have not identified a hereditary cause for her personal history of pancreatic at this time. This does not definitively rule out a hereditary predisposition to cancer. It is still possible that there could be genetic mutations that are undetectable by current technology. There could be genetic mutations in genes that have not been tested or identified to increase cancer risk.  Therefore, it is recommended she continue to follow the cancer management and screening guidelines provided by her oncology and primary healthcare provider.   An individual's cancer risk and medical management are not determined by genetic test results alone. Overall cancer risk assessment incorporates additional factors, including personal medical history, family history, and any available genetic information that may result in a personalized plan for cancer prevention and surveillance  RECOMMENDATIONS FOR FAMILY MEMBERS:  Individuals in this family might be at some increased risk of developing cancer, over the general population risk, simply due to the family history of cancer.  We recommended women in this family have a yearly mammogram beginning at age 97, or 20 years younger than the earliest onset of cancer, an annual clinical breast exam, and perform monthly breast self-exams. Women in this  family should also have a gynecological exam as recommended by their primary provider. All family members should be referred for colonoscopy starting at age 78.   FOLLOW-UP: Lastly, we discussed with Ms. Carrender that cancer genetics is a rapidly advancing field and it is possible that new genetic tests will be appropriate for her and/or her family members in the future. We encouraged her to remain in contact  with cancer genetics on an annual basis so we can update her personal and family histories and let her know of advances in cancer genetics that may benefit this family.   Our contact number was provided. Ms. Minichiello questions were answered to her satisfaction, and she knows she is welcome to call us at anytime with additional questions or concerns.     Reniyah Gootee M. Joette Catching, [place], [facility] Genetic Counselor [email or web address] (P) [phone]

## 2021-01-22 ENCOUNTER — Ambulatory Visit: Payer: Medicaid Other | Admitting: Gastroenterology

## 2021-02-13 ENCOUNTER — Emergency Department (HOSPITAL_COMMUNITY): Payer: Medicaid Other

## 2021-02-13 ENCOUNTER — Emergency Department (HOSPITAL_COMMUNITY)
Admission: EM | Admit: 2021-02-13 | Discharge: 2021-02-13 | Disposition: A | Discharge status: Home / Self Care | Payer: Medicaid Other | Attending: Emergency Medicine | Admitting: Emergency Medicine

## 2021-02-13 ENCOUNTER — Encounter (HOSPITAL_COMMUNITY): Payer: Self-pay

## 2021-02-13 DIAGNOSIS — O99332 Smoking (tobacco) complicating pregnancy, second trimester: Secondary | ICD-10-CM | POA: Insufficient documentation

## 2021-02-13 DIAGNOSIS — O99892 Other specified diseases and conditions complicating childbirth: Secondary | ICD-10-CM | POA: Diagnosis not present

## 2021-02-13 DIAGNOSIS — F17210 Nicotine dependence, cigarettes, uncomplicated: Secondary | ICD-10-CM | POA: Diagnosis not present

## 2021-02-13 DIAGNOSIS — Z8507 Personal history of malignant neoplasm of pancreas: Secondary | ICD-10-CM | POA: Insufficient documentation

## 2021-02-13 DIAGNOSIS — M436 Torticollis: Secondary | ICD-10-CM | POA: Diagnosis not present

## 2021-02-13 DIAGNOSIS — Z3A22 22 weeks gestation of pregnancy: Secondary | ICD-10-CM | POA: Insufficient documentation

## 2021-02-13 LAB — BASIC METABOLIC PANEL
Anion gap: 9 (ref 5–15)
BUN: <5 mg/dL — ABNORMAL LOW (ref 6–20)
CO2: 23 mmol/L (ref 22–32)
Calcium: 8.8 mg/dL — ABNORMAL LOW (ref 8.9–10.3)
Chloride: 103 mmol/L (ref 98–111)
Creatinine, Ser: 0.57 mg/dL (ref 0.44–1.00)
GFR, Estimated: >60 mL/min (ref >60)
Glucose, Bld: 105 mg/dL — ABNORMAL HIGH (ref 70–99)
Potassium: 3.5 mmol/L (ref 3.5–5.1)
Sodium: 135 mmol/L (ref 135–145)

## 2021-02-13 LAB — CBC WITH DIFFERENTIAL/PLATELET
Abs Immature Granulocytes: 0.21 10*3/uL — ABNORMAL HIGH (ref 0.00–0.07)
Basophils Absolute: 0.1 10*3/uL (ref 0.0–0.1)
Basophils Relative: 0 %
Eosinophils Absolute: 0.1 10*3/uL (ref 0.0–0.5)
Eosinophils Relative: 1 %
HCT: 29.5 % — ABNORMAL LOW (ref 36.0–46.0)
Hemoglobin: 9.7 g/dL — ABNORMAL LOW (ref 12.0–15.0)
Immature Granulocytes: 2 %
Lymphocytes Relative: 21 %
Lymphs Abs: 2.4 10*3/uL (ref 0.7–4.0)
MCH: 30.2 pg (ref 26.0–34.0)
MCHC: 32.9 g/dL (ref 30.0–36.0)
MCV: 91.9 fL (ref 80.0–100.0)
Monocytes Absolute: 0.7 10*3/uL (ref 0.1–1.0)
Monocytes Relative: 7 %
Neutro Abs: 7.7 10*3/uL (ref 1.7–7.7)
Neutrophils Relative %: 69 %
Platelets: 241 10*3/uL (ref 150–400)
RBC: 3.21 MIL/uL — ABNORMAL LOW (ref 3.87–5.11)
RDW: 14 % (ref 11.5–15.5)
WBC: 11.2 10*3/uL — ABNORMAL HIGH (ref 4.0–10.5)
nRBC: 0 % (ref 0.0–0.2)

## 2021-02-13 LAB — TROPONIN I (HIGH SENSITIVITY)
Troponin I (High Sensitivity): 8 ng/L (ref <18)
Troponin I (High Sensitivity): 6 ng/L (ref <18)

## 2021-02-13 MED ORDER — DIAZEPAM 5 MG PO TABS
5.0000 mg | ORAL_TABLET | Freq: Once | ORAL | Status: AC
  Administered 2021-02-13: 5 mg via ORAL
  Filled 2021-02-13: qty 1

## 2021-02-13 MED ORDER — HYDROMORPHONE HCL 1 MG/ML IJ SOLN
1.0000 mg | Freq: Once | INTRAMUSCULAR | Status: AC
  Administered 2021-02-13: 1 mg via INTRAVENOUS
  Filled 2021-02-13: qty 1

## 2021-02-13 MED ORDER — DIAZEPAM 5 MG PO TABS: 5.0000 mg | ORAL_TABLET | Freq: Three times a day (TID) | ORAL | 0 refills | Status: DC | PRN

## 2021-02-13 NOTE — ED Provider Notes (Signed)
Emergency Medicine Provider Triage Evaluation Note  Cynthia [name] , a 40 y.o. female  was evaluated in triage.  Pt complains of neck pain and jaw pain.  She states the symptoms started a week ago.  They have gradually worsened.  She states she does have a bad tooth, but doesn't feel like it's from that.  She is concerned it might be a complication from the port on the right side of her chest..  Review of Systems  Positive: Neck pain Negative: fever  Physical Exam  There were no vitals taken for this visit. Gen:   Awake, no distress   Resp:  Normal effort  MSK:   Moves extremities without difficulty  Other:  Right neck sore to touch, no obvious dental abscess  Medical Decision Making  Medically screening exam initiated at 4:10 AM.  Appropriate orders placed.  Cynthia [name] was informed that the remainder of the evaluation will be completed by another provider, this initial triage assessment does not replace that evaluation, and the importance of remaining in the ED until their evaluation is complete.     Montine Circle, PA-C 02/13/21 [id number]    Veryl Speak, MD 02/13/21 [phone]

## 2021-02-13 NOTE — ED Notes (Signed)
Patient transported to CT 

## 2021-02-13 NOTE — Discharge Instructions (Signed)
See your Physician for recheck.  °

## 2021-02-13 NOTE — ED Provider Notes (Signed)
[facility] EMERGENCY DEPARTMENT Provider Note   CSN: [id number] Arrival date & time: 02/13/21  0359     History Chief Complaint  Patient presents with  . Torticollis    Cynthia [name] is a 40 y.o. female.  Pt complains of pain on the right side of her neck.  Pt reports pain with turning her head.  Pt points to muscles right side of neck,  Trapezius and sternocleidomastoid as area of pain and tightness.  Pt has pancreatic cancer and is followed by [facility].  Pt denies fever or chills  No sore throat, no difficluty breathing or speaking   The history is provided by the patient. No language interpreter was used.       Past Medical History:  Diagnosis Date  . History of pulmonary embolism   . Normal pregnancy in multigravida in third trimester 07/18/2018  . Pancreatitis, acute 12/04/2020  . Tobacco abuse     Patient Active Problem List   Diagnosis Date Noted  . Genetic testing 01/14/2021  . Family history of gastric cancer 12/23/2020  . Family history of lung cancer 12/23/2020  . Pancreatic cancer ([place]) 12/10/2020  . Obstructive jaundice 11/30/2020  . Pancreatic mass 11/30/2020  . History of pulmonary embolus (PE) 11/30/2020  . Transaminitis 11/30/2020  . Abnormal MRI of the abdomen   . Biliary obstruction   . Acute pancreatitis 11/26/2020  . Choledocholithiasis 11/25/2020  . Cholelithiasis 11/25/2020  . Pancreatitis 11/25/2020  . Diarrhea 11/25/2020  . First trimester pregnancy   . Status post repeat low transverse cesarean section 08/03/2018  . Pregnancy 07/18/2018  . Sinus bradycardia 05/26/2015  . PE (pulmonary embolism) 05/25/2015  . Cigarette smoker 05/25/2015  . Pulmonary emboli ([place])   . H/O cesarean section 02/27/2015    Past Surgical History:  Procedure Laterality Date  . BIOPSY  12/02/2020   Procedure: BIOPSY;  Surgeon: Rush Landmark Telford Nab., MD;  Location: [facility];  Service: Gastroenterology;;  . CESAREAN SECTION    .  CESAREAN SECTION N/A 02/27/2015   Procedure: CESAREAN SECTION;  Surgeon: Newton Pigg, MD;  Location: [facility];  Service: Obstetrics;  Laterality: N/A;  . CESAREAN SECTION N/A 08/03/2018   Procedure: REPEAT CESAREAN SECTION;  Surgeon: Janyth Contes, MD;  Location: [facility];  Service: Obstetrics;  Laterality: N/A;  [name], RNFA  . ERCP N/A 12/02/2020   Procedure: ENDOSCOPIC RETROGRADE CHOLANGIOPANCREATOGRAPHY (ERCP);  Surgeon: Irving Copas., MD;  Location: [facility];  Service: Gastroenterology;  Laterality: N/A;  . ESOPHAGOGASTRODUODENOSCOPY N/A 12/02/2020   Procedure: ESOPHAGOGASTRODUODENOSCOPY (EGD);  Surgeon: Irving Copas., MD;  Location: [place] [facility];  Service: Gastroenterology;  Laterality: N/A;  . FINE NEEDLE ASPIRATION  12/02/2020   Procedure: FINE NEEDLE ASPIRATION (FNA) LINEAR;  Surgeon: Irving Copas., MD;  Location: [place];  Service: Gastroenterology;;  . UPPER ESOPHAGEAL ENDOSCOPIC ULTRASOUND (EUS) N/A 12/02/2020   Procedure: UPPER ESOPHAGEAL ENDOSCOPIC ULTRASOUND (EUS);  Surgeon: Irving Copas., MD;  Location: [place];  Service: Gastroenterology;  Laterality: N/A;     OB History    Gravida  9   Para  8   Term  8   Preterm      AB      Living  8     SAB      IAB      Ectopic      Multiple  0   Live Births  8           Family History  Problem Relation  Age of Onset  . Hypertension Mother   . Hypertension Father   . Gastric cancer Maternal Grandmother        dx unknown age  . Lung cancer Paternal Grandfather        dx after 24; smoking hx    Social History   Tobacco Use  . Smoking status: Current Every Day Smoker    Packs/day: 0.50    Years: 13.00    Pack years: 6.50    Types: Cigarettes  . Smokeless tobacco: Never Used  Vaping Use  . Vaping Use: Never used  Substance Use Topics  . Alcohol use: No    Alcohol/week: 0.0 standard drinks  . Drug use: No    Home Medications Prior  to Admission medications   Medication Sig Start Date End Date Taking? Authorizing Provider  docusate sodium (COLACE) 100 MG capsule Take 1 capsule (100 mg total) by mouth 2 (two) times daily. 12/05/20   Lavina Hamman, MD  oxyCODONE (OXY IR/ROXICODONE) 5 MG immediate release tablet Take 1 tablet (5 mg total) by mouth every 4 (four) hours as needed for severe pain. 12/10/20   Truitt Merle, MD  oxyCODONE-acetaminophen (PERCOCET/ROXICET) 5-325 MG tablet Take 1 tablet by mouth every 4 (four) hours as needed for severe pain. 12/07/20   Charlann Lange, PA-C  Prenatal Multivit-Min-Fe-FA (PRE-NATAL FORMULA) TABS Take 1 tablet by mouth daily. 12/05/20   Lavina Hamman, MD  enoxaparin (LOVENOX) 40 MG/0.4ML injection Inject 40 mg into the skin daily.  09/21/20  [provider]    Allergies    Patient has no known allergies.  Review of Systems   Review of Systems  Musculoskeletal: Positive for neck pain and neck stiffness.  All other systems reviewed and are negative.   Physical Exam Updated Vital Signs BP 103/61   Pulse 61   Temp 98.1 F (36.7 C) (Oral)   Resp 18   SpO2 97%   Physical Exam Vitals and nursing note reviewed.  Constitutional:      Appearance: She is well-developed.  HENT:     Head: Normocephalic.  Cardiovascular:     Rate and Rhythm: Normal rate.  Pulmonary:     Effort: Pulmonary effort is normal.  Abdominal:     General: There is no distension.  Musculoskeletal:        General: Tenderness present.     Cervical back: Normal range of motion.     Comments: Tender sternocleidomastoid area  Pain with turning side to side.   Neurological:     General: No focal deficit present.     Mental Status: She is alert and oriented to person, place, and time.  Psychiatric:        Mood and Affect: Mood normal.     ED Results / Procedures / Treatments   Labs (all labs ordered are listed, but only abnormal results are displayed) Labs Reviewed  CBC WITH DIFFERENTIAL/PLATELET -  Abnormal; Notable for the following components:      Result Value   WBC 11.2 (*)    RBC 3.21 (*)    Hemoglobin 9.7 (*)    HCT 29.5 (*)    Abs Immature Granulocytes 0.21 (*)    All other components within normal limits  BASIC METABOLIC PANEL - Abnormal; Notable for the following components:   Glucose, Bld 105 (*)    BUN <5 (*)    Calcium 8.8 (*)    All other components within normal limits  TROPONIN I (HIGH SENSITIVITY)  TROPONIN I (HIGH SENSITIVITY)    EKG EKG Interpretation  Date/Time:  Thursday February 13 2021 04:21:21 EDT Ventricular Rate:  75 PR Interval:  146 QRS Duration: 72 QT Interval:  380 QTC Calculation: 424 R Axis:   59 Text Interpretation: Normal sinus rhythm Normal ECG Confirmed by Merrily Pew [phone] on 02/13/2021 4:40:01 AM   Radiology CT Soft Tissue Neck Wo Contrast  Result Date: 02/13/2021 CLINICAL DATA:  Right neck pain. Recent diagnosis of pancreatic cancer. Patient is pregnant. EXAM: CT NECK WITHOUT CONTRAST TECHNIQUE: Multidetector CT imaging of the neck was performed following the standard protocol without intravenous contrast. COMPARISON:  None. FINDINGS: Pharynx and larynx: Normal pharynx. The larynx is diffusely thickened bilaterally. This could be due to edema or closed glottis during the scan. Patient currently is not hoarse and this may be physiologic. Salivary glands: No inflammation, mass, or stone. Thyroid: Negative Lymph nodes: No enlarged lymph nodes in the neck. Vascular: Limited vascular evaluation without intravenous contrast. Right jugular Port-A-Cath tip extends into the SVC in good position. No distension or edema around the jugular vein. Limited intracranial: Negative Visualized orbits: Negative Mastoids and visualized paranasal sinuses: Mild mucosal edema. Skeleton: Multiple dental caries including right upper right lower molars. Periapical lucency around right lower first molar with extensive caries. Caries left lower third molar. No adjacent  soft tissue swelling. Upper chest: Lung apices clear bilaterally. Other: None IMPRESSION: Negative for mass or adenopathy in the neck Symmetric thickening of the larynx bilaterally. This may be due to closed glottis during scanning as the patient has no voice symptoms. Right jugular Port-A-Cath tip extends into the SVC without complication Multiple dental caries. Periapical lucency around right lower first molar without adjacent soft tissue swelling. These results were called by telephone at the time of interpretation on 02/13/2021 at 7:50 am to provider [facility] , who verbally acknowledged these results. Electronically Signed   By: Franchot Gallo M.D.   On: 02/13/2021 07:51    Procedures Procedures   Medications Ordered in ED Medications  diazepam (VALIUM) tablet 5 mg (5 mg Oral Given 02/13/21 0743)  HYDROmorphone (DILAUDID) injection 1 mg (1 mg Intravenous Given 02/13/21 5009)    ED Course  I have reviewed the triage vital signs and the nursing notes.  Pertinent labs & imaging results that were available during my care of the patient were reviewed by me and considered in my medical decision making (see chart for details).    MDM Rules/Calculators/A&P                          MDM:  Ct reviewed,  Pt feels better and is able to move better after medications  Ct reviewed.  Pt throat is clear, voice is normal,   Final Clinical Impression(s) / ED Diagnoses Final diagnoses:  Torticollis, acute    Rx / DC Orders ED Discharge Orders         Ordered    diazepam (VALIUM) 5 MG tablet  Every 8 hours PRN        02/13/21 0951        Pt advised to follow up with her MD at [facility].  Return if symptoms worsen or change An After Visit Summary was printed and given to the patient.    Fransico Meadow, [place] 02/13/21 3818    Lucrezia Starch, MD 02/15/21 [phone]

## 2021-02-13 NOTE — ED Notes (Signed)
Pt is also [redacted] weeks pregnant

## 2021-02-13 NOTE — ED Triage Notes (Signed)
Pt states that for the past week she has been having pain on the R side of her neck and jaw area, worse tonight. Actively receiving chemo

## 2021-06-09 ENCOUNTER — Other Ambulatory Visit: Payer: Self-pay

## 2021-06-09 ENCOUNTER — Inpatient Hospital Stay (HOSPITAL_COMMUNITY)
Admission: AD | Admit: 2021-06-09 | Discharge: 2021-06-09 | Disposition: A | Discharge status: Other Acute Inpatient Hospital | Payer: Medicaid Other | Attending: Family Medicine | Admitting: Family Medicine

## 2021-06-09 ENCOUNTER — Encounter (HOSPITAL_COMMUNITY): Payer: Self-pay | Admitting: Family Medicine

## 2021-06-09 ENCOUNTER — Inpatient Hospital Stay (HOSPITAL_COMMUNITY): Payer: Medicaid Other

## 2021-06-09 DIAGNOSIS — Z86711 Personal history of pulmonary embolism: Secondary | ICD-10-CM | POA: Diagnosis not present

## 2021-06-09 DIAGNOSIS — U071 COVID-19: Secondary | ICD-10-CM | POA: Insufficient documentation

## 2021-06-09 DIAGNOSIS — F17210 Nicotine dependence, cigarettes, uncomplicated: Secondary | ICD-10-CM | POA: Insufficient documentation

## 2021-06-09 DIAGNOSIS — O1415 Severe pre-eclampsia, complicating the puerperium: Secondary | ICD-10-CM | POA: Diagnosis not present

## 2021-06-09 DIAGNOSIS — C250 Malignant neoplasm of head of pancreas: Secondary | ICD-10-CM

## 2021-06-09 DIAGNOSIS — Z7901 Long term (current) use of anticoagulants: Secondary | ICD-10-CM

## 2021-06-09 DIAGNOSIS — R1011 Right upper quadrant pain: Secondary | ICD-10-CM

## 2021-06-09 DIAGNOSIS — Z86718 Personal history of other venous thrombosis and embolism: Secondary | ICD-10-CM | POA: Insufficient documentation

## 2021-06-09 DIAGNOSIS — O902 Hematoma of obstetric wound: Secondary | ICD-10-CM

## 2021-06-09 HISTORY — DX: Malignant neoplasm of pancreas, unspecified: C25.9

## 2021-06-09 LAB — CBC WITH DIFFERENTIAL/PLATELET
Abs Immature Granulocytes: 0.03 10*3/uL (ref 0.00–0.07)
Basophils Absolute: 0 10*3/uL (ref 0.0–0.1)
Basophils Relative: 1 %
Eosinophils Absolute: 0.2 10*3/uL (ref 0.0–0.5)
Eosinophils Relative: 3 %
HCT: 29.5 % — ABNORMAL LOW (ref 36.0–46.0)
Hemoglobin: 9.6 g/dL — ABNORMAL LOW (ref 12.0–15.0)
Immature Granulocytes: 0 %
Lymphocytes Relative: 33 %
Lymphs Abs: 2.4 10*3/uL (ref 0.7–4.0)
MCH: 30.9 pg (ref 26.0–34.0)
MCHC: 32.5 g/dL (ref 30.0–36.0)
MCV: 94.9 fL (ref 80.0–100.0)
Monocytes Absolute: 0.4 10*3/uL (ref 0.1–1.0)
Monocytes Relative: 5 %
Neutro Abs: 4.2 10*3/uL (ref 1.7–7.7)
Neutrophils Relative %: 58 %
Platelets: 439 10*3/uL — ABNORMAL HIGH (ref 150–400)
RBC: 3.11 MIL/uL — ABNORMAL LOW (ref 3.87–5.11)
RDW: 16.1 % — ABNORMAL HIGH (ref 11.5–15.5)
WBC: 7.2 10*3/uL (ref 4.0–10.5)
nRBC: 0 % (ref 0.0–0.2)

## 2021-06-09 LAB — COMPREHENSIVE METABOLIC PANEL
ALT: 16 U/L (ref 0–44)
AST: 20 U/L (ref 15–41)
Albumin: 3.5 g/dL (ref 3.5–5.0)
Alkaline Phosphatase: 76 U/L (ref 38–126)
Anion gap: 10 (ref 5–15)
BUN: 5 mg/dL — ABNORMAL LOW (ref 6–20)
CO2: 25 mmol/L (ref 22–32)
Calcium: 9.1 mg/dL (ref 8.9–10.3)
Chloride: 104 mmol/L (ref 98–111)
Creatinine, Ser: 0.57 mg/dL (ref 0.44–1.00)
GFR, Estimated: >60 mL/min (ref >60)
Glucose, Bld: 95 mg/dL (ref 70–99)
Potassium: 3.3 mmol/L — ABNORMAL LOW (ref 3.5–5.1)
Sodium: 139 mmol/L (ref 135–145)
Total Bilirubin: 0.7 mg/dL (ref 0.3–1.2)
Total Protein: 6.6 g/dL (ref 6.5–8.1)

## 2021-06-09 LAB — RESP PANEL BY RT-PCR (FLU A&B, COVID) ARPGX2
Influenza A by PCR: NEGATIVE
Influenza B by PCR: NEGATIVE
SARS Coronavirus 2 by RT PCR: POSITIVE — AB

## 2021-06-09 MED ORDER — OXYCODONE HCL 5 MG PO TABS
5.0000 mg | ORAL_TABLET | Freq: Once | ORAL | Status: AC
  Administered 2021-06-09: 5 mg via ORAL
  Filled 2021-06-09: qty 1

## 2021-06-09 MED ORDER — HYDRALAZINE HCL 20 MG/ML IJ SOLN: 10.0000 mg | INTRAMUSCULAR | Status: DC | PRN

## 2021-06-09 MED ORDER — LACTATED RINGERS IV SOLN: INTRAVENOUS | Status: DC

## 2021-06-09 MED ORDER — LABETALOL HCL 5 MG/ML IV SOLN
40.0000 mg | INTRAVENOUS | Status: DC | PRN
  Administered 2021-06-09: 40 mg via INTRAVENOUS
  Filled 2021-06-09: qty 8

## 2021-06-09 MED ORDER — LABETALOL HCL 5 MG/ML IV SOLN: 40.0000 mg | INTRAVENOUS | Status: DC | PRN

## 2021-06-09 MED ORDER — LABETALOL HCL 5 MG/ML IV SOLN: 80.0000 mg | INTRAVENOUS | Status: DC | PRN

## 2021-06-09 MED ORDER — LABETALOL HCL 5 MG/ML IV SOLN: 20.0000 mg | INTRAVENOUS | Status: DC | PRN

## 2021-06-09 MED ORDER — MAGNESIUM SULFATE BOLUS VIA INFUSION
4.0000 g | Freq: Once | INTRAVENOUS | Status: AC
  Administered 2021-06-09: 4 g via INTRAVENOUS
  Filled 2021-06-09: qty 1000

## 2021-06-09 MED ORDER — ACETAMINOPHEN 500 MG PO TABS
1000.0000 mg | ORAL_TABLET | Freq: Once | ORAL | Status: AC
  Administered 2021-06-09: 1000 mg via ORAL
  Filled 2021-06-09: qty 2

## 2021-06-09 MED ORDER — MAGNESIUM SULFATE 40 GM/1000ML IV SOLN
2.0000 g/h | INTRAVENOUS | Status: DC
  Administered 2021-06-09: 2 g/h via INTRAVENOUS
  Filled 2021-06-09: qty 1000

## 2021-06-09 MED ORDER — LABETALOL HCL 5 MG/ML IV SOLN
20.0000 mg | INTRAVENOUS | Status: DC | PRN
  Administered 2021-06-09: 20 mg via INTRAVENOUS
  Filled 2021-06-09: qty 4

## 2021-06-09 MED ORDER — IOHEXOL 300 MG/ML  SOLN
100.0000 mL | Freq: Once | INTRAMUSCULAR | Status: AC | PRN
  Administered 2021-06-09: 100 mL via INTRAVENOUS

## 2021-06-09 NOTE — MAU Provider Note (Addendum)
History     CSN: [id number]  Arrival date and time: 06/09/21 1103   None     Chief Complaint  Patient presents with   Wound Check    Cynthia [name] is a 40 yo G9P9 female, now approximately 2 weeks postpartum/post-op from a repeat C/S @ 37 weeks on 05/26/2021 at [facility], presenting for evaluation of bleeding at her incision site.  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  She reports central bleeding from her incision site since this past Wednesday, 9/21.  She has been changing her gauze dressing approximately 3-4 times daily and soaking completely through the gauze each time.  She self discontinued her Lovenox this past Friday due to the bleeding.  She reports edema/swelling around her incision site since the surgery, denies any skin redness, warmth to touch, or fever.  She has not taken off the Steri-Strips.  She also endorses right upper quadrant abdominal pain that has worsened since delivery, but was also present prior to as well.  Constant, better when she takes her pain regimen (oxycodone, Motrin, Tylenol).  Eating and drinking as normal, no change in the pain with eating.  Denies any current headache, blurred vision/change in vision, N/V/change in BM, or dysuria.  She does not have a history of hypertensive disorder in this pregnancy and has not checked her blood pressure at home, however noted to be quite elevated upon arrival. Denies any leg swelling, SOB, or chest pain.    Past Medical History:  Diagnosis Date   History of pulmonary embolism    Normal pregnancy in multigravida in third trimester 07/18/2018   Pancreatic cancer ([place])    Pancreatitis, acute 12/04/2020   Tobacco abuse     Past Surgical History:  Procedure Laterality Date   BIOPSY  12/02/2020   Procedure: BIOPSY;  Surgeon: Rush Landmark Telford Nab., MD;  Location: [place];  Service: Gastroenterology;;   [place] N/A 02/27/2015   Procedure: CESAREAN SECTION;  Surgeon: Newton Pigg, MD;  Location: [facility];  Service: Obstetrics;  Laterality: N/A;   CESAREAN SECTION N/A 08/03/2018   Procedure: REPEAT CESAREAN SECTION;  Surgeon: Janyth Contes, MD;  Location: [facility];  Service: Obstetrics;  Laterality: N/ANira Conn, RNFA   ERCP N/A 12/02/2020   Procedure: ENDOSCOPIC RETROGRADE CHOLANGIOPANCREATOGRAPHY (ERCP);  Surgeon: Irving Copas., MD;  Location: [place];  Service: Gastroenterology;  Laterality: N/A;   ESOPHAGOGASTRODUODENOSCOPY N/A 12/02/2020   Procedure: ESOPHAGOGASTRODUODENOSCOPY (EGD);  Surgeon: Irving Copas., MD;  Location: [place];  Service: Gastroenterology;  Laterality: N/A;   FINE NEEDLE ASPIRATION  12/02/2020   Procedure: FINE NEEDLE ASPIRATION (FNA) LINEAR;  Surgeon: Irving Copas., MD;  Location: [place];  Service: Gastroenterology;;   UPPER ESOPHAGEAL ENDOSCOPIC ULTRASOUND (EUS) N/A 12/02/2020   Procedure: UPPER ESOPHAGEAL ENDOSCOPIC ULTRASOUND (EUS);  Surgeon: Irving Copas., MD;  Location: [place];  Service: Gastroenterology;  Laterality: N/A;    Family History  Problem Relation Age of Onset   Hypertension Mother    Hypertension Father    Gastric cancer Maternal Grandmother        dx unknown age   Lung cancer Paternal Grandfather        dx after 35; smoking hx    Social History   Tobacco Use   Smoking status: Every Day    Packs/day: 0.50    Years: 13.00    Pack years:  6.50    Types: Cigarettes   Smokeless tobacco: Never  Vaping Use   Vaping Use: Never used  Substance Use Topics   Alcohol use: No    Alcohol/week: 0.0 standard drinks   Drug use: No    Allergies: No Known Allergies  Medications Prior to Admission  Medication Sig Dispense Refill Last Dose   diazepam (VALIUM) 5 MG tablet Take 1 tablet (5 mg total) by mouth every 8 (eight) hours as needed for muscle spasms.  9 tablet 0    docusate sodium (COLACE) 100 MG capsule Take 1 capsule (100 mg total) by mouth 2 (two) times daily. 10 capsule 0    oxyCODONE (OXY IR/ROXICODONE) 5 MG immediate release tablet Take 1 tablet (5 mg total) by mouth every 4 (four) hours as needed for severe pain. 75 tablet 0    oxyCODONE-acetaminophen (PERCOCET/ROXICET) 5-325 MG tablet Take 1 tablet by mouth every 4 (four) hours as needed for severe pain. 15 tablet 0    Prenatal Multivit-Min-Fe-FA (PRE-NATAL FORMULA) TABS Take 1 tablet by mouth daily. 30 tablet 0     Review of Systems  Constitutional:  Negative for activity change, appetite change and fever.  Eyes:  Negative for visual disturbance.  Respiratory:  Negative for shortness of breath.   Cardiovascular:  Negative for chest pain, palpitations and leg swelling.  Gastrointestinal:  Positive for abdominal pain. Negative for constipation, diarrhea, nausea and vomiting.  Genitourinary:  Negative for difficulty urinating, dysuria and vaginal discharge.  Skin:  Positive for wound.  Neurological:  Negative for dizziness, syncope, light-headedness and headaches.  Psychiatric/Behavioral:  Negative for confusion.   Physical Exam   Blood pressure (!) 163/76, pulse (!) 53, temperature 98.1 F (36.7 C), resp. rate 18, height 5\' 5"  (1.651 m), weight 64.9 kg, unknown if currently breastfeeding.  Physical Exam Constitutional:      Appearance: Normal appearance. She is not ill-appearing.  HENT:     Head: Normocephalic and atraumatic.     Mouth/Throat:     Mouth: Mucous membranes are moist.  Eyes:     Extraocular Movements: Extraocular movements intact.  Cardiovascular:     Pulses: Normal pulses.  Pulmonary:     Effort: Pulmonary effort is normal.  Abdominal:     General: There is no distension.     Comments: Right upper extending into mid axillary abdominal tenderness with palpation, without any rebounding or guarding.  Abdominal tenderness at the incision site, otherwise no  abdominal pain elsewhere.   Removed Steri-Strips from C-section incision.  Note bleeding/oozing from central portion of incision.  Periphery of incision bilaterally appears to be healing well without any erythema or warmth to touch.  Significant edematous/indurated tissue approximately a few centimeters above and below the incision for the entirety of the incision with tenderness to palpation.  See picture below.  Skin:    General: Skin is warm and dry.     Capillary Refill: Capillary refill takes less than 2 seconds.     Findings: Lesion present.  Neurological:     Mental Status: She is alert and oriented to person, place, and time.     Comments: She is alert and oriented.  EOMI.  Smile symmetrical.  Able to follow complex commands without difficulty.  Able to move all extremities spontaneously and equally.  Psychiatric:        Mood and Affect: Mood normal.        Behavior: Behavior normal.      Results for orders placed or performed  during the hospital encounter of 06/09/21 (from the past 72 hour(s))  CBC with Differential/Platelet     Status: Abnormal   Collection Time: 06/09/21 12:53 PM  Result Value Ref Range   WBC 7.2 4.0 - 10.5 K/uL   RBC 3.11 (L) 3.87 - 5.11 MIL/uL   Hemoglobin 9.6 (L) 12.0 - 15.0 g/dL   HCT 29.5 (L) 36.0 - 46.0 %   MCV 94.9 80.0 - 100.0 fL   MCH 30.9 26.0 - 34.0 pg   MCHC 32.5 30.0 - 36.0 g/dL   RDW 16.1 (H) 11.5 - 15.5 %   Platelets 439 (H) 150 - 400 K/uL   nRBC 0.0 0.0 - 0.2 %   Neutrophils Relative % 58 %   Neutro Abs 4.2 1.7 - 7.7 K/uL   Lymphocytes Relative 33 %   Lymphs Abs 2.4 0.7 - 4.0 K/uL   Monocytes Relative 5 %   Monocytes Absolute 0.4 0.1 - 1.0 K/uL   Eosinophils Relative 3 %   Eosinophils Absolute 0.2 0.0 - 0.5 K/uL   Basophils Relative 1 %   Basophils Absolute 0.0 0.0 - 0.1 K/uL   Immature Granulocytes 0 %   Abs Immature Granulocytes 0.03 0.00 - 0.07 K/uL    Comment: Performed at [facility], [address], [place],  [place] [address]  Comprehensive metabolic panel     Status: Abnormal   Collection Time: 06/09/21 12:53 PM  Result Value Ref Range   Sodium 139 135 - 145 mmol/L   Potassium 3.3 (L) 3.5 - 5.1 mmol/L   Chloride 104 98 - 111 mmol/L   CO2 25 22 - 32 mmol/L   Glucose, Bld 95 70 - 99 mg/dL    Comment: Glucose reference range applies only to samples taken after fasting for at least 8 hours.   BUN 5 (L) 6 - 20 mg/dL   Creatinine, Ser 0.57 0.44 - 1.00 mg/dL   Calcium 9.1 8.9 - 10.3 mg/dL   Total Protein 6.6 6.5 - 8.1 g/dL   Albumin 3.5 3.5 - 5.0 g/dL   AST 20 15 - 41 U/L   ALT 16 0 - 44 U/L   Alkaline Phosphatase 76 38 - 126 U/L   Total Bilirubin 0.7 0.3 - 1.2 mg/dL   GFR, Estimated >60 >60 mL/min    Comment: (NOTE) Calculated using the CKD-EPI Creatinine Equation (2021)    Anion gap 10 5 - 15    Comment: Performed at [facility] [address], [place], [place] [address]    CT ABDOMEN PELVIS W CONTRAST  Result Date: 06/09/2021 CLINICAL DATA:  Abdominal distension 2 weeks post c-section, bleeding and edema at incision site. c-section 05/26/2021 EXAM: CT ABDOMEN AND PELVIS WITH CONTRAST TECHNIQUE: Multidetector CT imaging of the abdomen and pelvis was performed using the standard protocol following bolus administration of intravenous contrast. CONTRAST:  118mL OMNIPAQUE IOHEXOL 300 MG/ML  SOLN COMPARISON:  None. FINDINGS: Lower chest: Bilateral lower lobe subsegmental atelectasis. No acute abnormality. Hepatobiliary: No focal liver abnormality. No gallstones, gallbladder wall thickening, or pericholecystic fluid. Common bile duct stent in appropriate position with associated pneumobilia. No biliary dilatation. Pancreas: Diffuse pancreatic atrophy. Known pancreatic mass difficult to measure with masslike density surrounding the distal common bile duct (3:27). No surrounding inflammatory changes. Interval worsening of diffuse main pancreatic duct dilatation measuring up to 9 mm. Spleen: Normal in size  without focal abnormality. Adrenals/Urinary Tract: No adrenal nodule bilaterally. Bilateral kidneys enhance symmetrically. No hydronephrosis. No hydroureter. The urinary  bladder is unremarkable. Stomach/Bowel: Stomach is within normal limits. No evidence of bowel wall thickening or dilatation. Appendix appears normal. Vascular/Lymphatic: No abdominal aorta or iliac aneurysm. Mild atherosclerotic plaque of the aorta and its branches. No abdominal, pelvic, or inguinal lymphadenopathy. Reproductive: Postpartum uterus noted. Otherwise uterus and bilateral adnexa are unremarkable. Other: No intraperitoneal free fluid. No intraperitoneal free gas. No organized fluid collection. Musculoskeletal: Large subcutaneus soft tissue acute hematoma formation along the entire C-section incision measuring up to at least 5 x 4 x 18 cm. Overlying dermal thickening likely postsurgical. No associated subcutaneus soft tissue fat stranding. Soft tissue density foci within the left anterior abdomen likely related to injection medication. No suspicious lytic or blastic osseous lesions. No acute displaced fracture. IMPRESSION: 1. Acute 5 x 4 x 18 cm subcutaneus soft tissue hematoma along the entire C-section incision. 2. Interval worsening of diffuse main pancreatic duct dilatation measuring up to 9 mm in a patient with known pancreatic adenocarcinoma. Pancreatic mass difficult to measure with masslike density surrounding the distal common bile duct. 3. Common bile duct stent in grossly appropriate position with associated expected pneumobilia. Electronically Signed   By: Iven Finn M.D.   On: 06/09/2021 16:00      MAU Course   MDM CBC, CMP CT abdomen/pelvis> R/o hematoma/infection, eval RUQ  Home oxycodone 5 mg given for pain IV labetalol protocol, required 20 mg and 40 mg dose prior to becoming normotensive, IV Mg 4g bolus + 2g maintenance started for severe range pressures   CT returned with 5 x 4 x 18 cm subcutaneous acute  hematoma along the entirety of her incision site.  Interval increase in the pancreatic duct dilatation with known pancreatic cancer, however common bile duct stent in appropriate location without abnormal dilation.  LFTs and CR WNL.  Hemoglobin 9.6, overall stable from 10.8 over 2 weeks ago. Plts 439. No leukocytosis.   Will discuss transfer to [place]. Patient requires multidisciplinary care given her high risk/complex medical co-morbidities, and appropriate continuity of care with caesarean surgery performed at [facility].   1730: Spoke with [facility] transfer service and OB/GYN on-call, Georgina Pillion MD, who accepted the patient for transfer.  Assessment and Plan  1. Large Hematoma of surgical wound following cesarean section 2. Pre-eclampsia, severe, postpartum condition 3. Malignant neoplasm of head of pancreas ([place]) 4. RUQ abdominal pain 5. Chronic anticoagulation (Last dose Friday 9/23)  Plan:  Monitor BP/symptoms of pre-eclampsia   Continue IV mg  Labetalol PRN  Transfer to [facility] for inpatient care/surgical evaluation of above concerns   Update: Covid swab obtained for transport/admission returned positive. Patient was not aware of a positive status prior to this with no recent infection. She continues to deny any SOB/Chest pain, cough, congestion, or myalgias. Transport team was notified and awaiting updated bed assignment at [facility].   Patriciaann Clan 06/09/2021, 1:48 PM

## 2021-06-09 NOTE — MAU Note (Signed)
RN transported pt to CT. Magnesium infusion paused during scan per verbal order by Dr Beard/Dr Nehemiah Settle.

## 2021-06-09 NOTE — MAU Note (Signed)
Pp c-section 05/26/2021. Stated her incision has been bleeding x 4 days. Denies any fefer or chills or odor. Reports area is hurting but not any more than it has .

## 2021-07-21 ENCOUNTER — Encounter (HOSPITAL_BASED_OUTPATIENT_CLINIC_OR_DEPARTMENT_OTHER): Payer: Medicaid Other | Attending: Internal Medicine | Admitting: Internal Medicine

## 2021-08-09 ENCOUNTER — Emergency Department (HOSPITAL_COMMUNITY): Payer: Medicaid Other

## 2021-08-09 ENCOUNTER — Emergency Department (HOSPITAL_COMMUNITY)
Admission: EM | Admit: 2021-08-09 | Discharge: 2021-08-09 | Disposition: A | Discharge status: Home / Self Care | Payer: Medicaid Other | Attending: Emergency Medicine | Admitting: Emergency Medicine

## 2021-08-09 ENCOUNTER — Encounter (HOSPITAL_COMMUNITY): Payer: Self-pay | Admitting: Emergency Medicine

## 2021-08-09 DIAGNOSIS — F17210 Nicotine dependence, cigarettes, uncomplicated: Secondary | ICD-10-CM | POA: Insufficient documentation

## 2021-08-09 DIAGNOSIS — R1010 Upper abdominal pain, unspecified: Secondary | ICD-10-CM

## 2021-08-09 DIAGNOSIS — Z8719 Personal history of other diseases of the digestive system: Secondary | ICD-10-CM | POA: Diagnosis not present

## 2021-08-09 DIAGNOSIS — Z7901 Long term (current) use of anticoagulants: Secondary | ICD-10-CM | POA: Insufficient documentation

## 2021-08-09 DIAGNOSIS — R1011 Right upper quadrant pain: Secondary | ICD-10-CM | POA: Insufficient documentation

## 2021-08-09 DIAGNOSIS — E871 Hypo-osmolality and hyponatremia: Secondary | ICD-10-CM | POA: Insufficient documentation

## 2021-08-09 DIAGNOSIS — Z8507 Personal history of malignant neoplasm of pancreas: Secondary | ICD-10-CM | POA: Insufficient documentation

## 2021-08-09 LAB — URINALYSIS, ROUTINE W REFLEX MICROSCOPIC
Bilirubin Urine: NEGATIVE
Glucose, UA: NEGATIVE mg/dL
Hgb urine dipstick: NEGATIVE
Ketones, ur: NEGATIVE mg/dL
Nitrite: NEGATIVE
Protein, ur: NEGATIVE mg/dL
Specific Gravity, Urine: <1.005 — ABNORMAL LOW (ref 1.005–1.030)
pH: 6 (ref 5.0–8.0)

## 2021-08-09 LAB — COMPREHENSIVE METABOLIC PANEL
ALT: 18 U/L (ref 0–44)
AST: 15 U/L (ref 15–41)
Albumin: 3.9 g/dL (ref 3.5–5.0)
Alkaline Phosphatase: 58 U/L (ref 38–126)
Anion gap: 7 (ref 5–15)
BUN: 6 mg/dL (ref 6–20)
CO2: 25 mmol/L (ref 22–32)
Calcium: 8.8 mg/dL — ABNORMAL LOW (ref 8.9–10.3)
Chloride: 101 mmol/L (ref 98–111)
Creatinine, Ser: 0.59 mg/dL (ref 0.44–1.00)
GFR, Estimated: >60 mL/min (ref >60)
Glucose, Bld: 104 mg/dL — ABNORMAL HIGH (ref 70–99)
Potassium: 3.8 mmol/L (ref 3.5–5.1)
Sodium: 133 mmol/L — ABNORMAL LOW (ref 135–145)
Total Bilirubin: 0.5 mg/dL (ref 0.3–1.2)
Total Protein: 6.5 g/dL (ref 6.5–8.1)

## 2021-08-09 LAB — CBC WITH DIFFERENTIAL/PLATELET
Abs Immature Granulocytes: 0.01 10*3/uL (ref 0.00–0.07)
Basophils Absolute: 0 10*3/uL (ref 0.0–0.1)
Basophils Relative: 1 %
Eosinophils Absolute: 0.1 10*3/uL (ref 0.0–0.5)
Eosinophils Relative: 1 %
HCT: 36.3 % (ref 36.0–46.0)
Hemoglobin: 12.2 g/dL (ref 12.0–15.0)
Immature Granulocytes: 0 %
Lymphocytes Relative: 46 %
Lymphs Abs: 2.1 10*3/uL (ref 0.7–4.0)
MCH: 30.1 pg (ref 26.0–34.0)
MCHC: 33.6 g/dL (ref 30.0–36.0)
MCV: 89.6 fL (ref 80.0–100.0)
Monocytes Absolute: 0.4 10*3/uL (ref 0.1–1.0)
Monocytes Relative: 9 %
Neutro Abs: 2 10*3/uL (ref 1.7–7.7)
Neutrophils Relative %: 43 %
Platelets: 331 10*3/uL (ref 150–400)
RBC: 4.05 MIL/uL (ref 3.87–5.11)
RDW: 12.6 % (ref 11.5–15.5)
WBC: 4.6 10*3/uL (ref 4.0–10.5)
nRBC: 0 % (ref 0.0–0.2)

## 2021-08-09 LAB — I-STAT BETA HCG BLOOD, ED (MC, WL, AP ONLY): I-stat hCG, quantitative: 6.1 m[IU]/mL — ABNORMAL HIGH (ref <5)

## 2021-08-09 LAB — HCG, QUANTITATIVE, PREGNANCY: hCG, Beta Chain, Quant, S: 7 m[IU]/mL — ABNORMAL HIGH (ref <5)

## 2021-08-09 LAB — LIPASE, BLOOD: Lipase: 18 U/L (ref 11–51)

## 2021-08-09 MED ORDER — HYDROCODONE-ACETAMINOPHEN 5-325 MG PO TABS
1.0000 | ORAL_TABLET | Freq: Once | ORAL | Status: AC
  Administered 2021-08-09: 1 via ORAL
  Filled 2021-08-09: qty 1

## 2021-08-09 MED ORDER — FAMOTIDINE IN NACL 20-0.9 MG/50ML-% IV SOLN
20.0000 mg | Freq: Once | INTRAVENOUS | Status: DC
  Filled 2021-08-09: qty 50

## 2021-08-09 MED ORDER — PANTOPRAZOLE SODIUM 20 MG PO TBEC: 20.0000 mg | DELAYED_RELEASE_TABLET | Freq: Every day | ORAL | 0 refills | Status: DC

## 2021-08-09 MED ORDER — ALUM & MAG HYDROXIDE-SIMETH 200-200-20 MG/5ML PO SUSP
15.0000 mL | Freq: Once | ORAL | Status: DC
  Filled 2021-08-09: qty 30

## 2021-08-09 MED ORDER — SUCRALFATE 1 G PO TABS: 1.0000 g | ORAL_TABLET | Freq: Three times a day (TID) | ORAL | 0 refills | Status: DC

## 2021-08-09 MED ORDER — HYDROCODONE-ACETAMINOPHEN 5-325 MG PO TABS: 1.0000 | ORAL_TABLET | ORAL | 0 refills | Status: DC | PRN

## 2021-08-09 MED ORDER — MORPHINE SULFATE (PF) 4 MG/ML IV SOLN
4.0000 mg | Freq: Once | INTRAVENOUS | Status: AC
  Administered 2021-08-09: 4 mg via INTRAVENOUS
  Filled 2021-08-09: qty 1

## 2021-08-09 MED ORDER — PANTOPRAZOLE SODIUM 40 MG IV SOLR
40.0000 mg | Freq: Once | INTRAVENOUS | Status: AC
  Administered 2021-08-09: 40 mg via INTRAVENOUS
  Filled 2021-08-09: qty 40

## 2021-08-09 MED ORDER — ALUM & MAG HYDROXIDE-SIMETH 200-200-20 MG/5ML PO SUSP
30.0000 mL | Freq: Once | ORAL | Status: AC
  Administered 2021-08-09: 30 mL via ORAL

## 2021-08-09 MED ORDER — IOHEXOL 350 MG/ML SOLN
80.0000 mL | Freq: Once | INTRAVENOUS | Status: AC | PRN
  Administered 2021-08-09: 80 mL via INTRAVENOUS

## 2021-08-09 MED ORDER — ONDANSETRON HCL 4 MG/2ML IJ SOLN
4.0000 mg | Freq: Once | INTRAMUSCULAR | Status: AC
  Administered 2021-08-09: 4 mg via INTRAVENOUS
  Filled 2021-08-09: qty 2

## 2021-08-09 MED ORDER — DICYCLOMINE HCL 20 MG PO TABS: 20.0000 mg | ORAL_TABLET | Freq: Two times a day (BID) | ORAL | 0 refills | Status: DC | PRN

## 2021-08-09 NOTE — ED Provider Notes (Signed)
  Provider Note MRN:  [id number]  [place] date & time: 08/09/21    ED Course and Medical Decision Making  Assumed care from Dr Roxanne Mins at shift change.  See not from prior team for complete details, in brief: Pancreatic ca with biliary stent, RUQ pain worse with PO. CT re-demonstration of pancreatic mass (follows at [facility], chemo), labs stable. Given morphine/dilaudid/ppi.    Plan per prior physician pain control.   Pt does report that overall she is feeling much better, UA without evidence of infection. She is able to tolerate PO intake and feels comfortable going home at this time. Advised her to f/u with PCP within the next 48 hours. Strict return precautions were discussed with pt. Accompanied by family in room.  The patient improved significantly and was discharged in stable condition. Detailed discussions were had with the patient regarding current findings, and need for close f/u with PCP or on call doctor. The patient has been instructed to return immediately if the symptoms worsen in any way for re-evaluation. Patient verbalized understanding and is in agreement with current care plan. All questions answered prior to discharge.    Procedures  Final Clinical Impressions(s) / ED Diagnoses     ICD-10-CM   1. Upper abdominal pain  R10.10     2. Hyponatremia  E87.1       ED Discharge Orders          Ordered    pantoprazole (PROTONIX) 20 MG tablet  Daily        08/09/21 1103    sucralfate (CARAFATE) 1 g tablet  3 times daily with meals & bedtime        08/09/21 1103    dicyclomine (BENTYL) 20 MG tablet  2 times daily PRN        08/09/21 1103    HYDROcodone-acetaminophen (NORCO/VICODIN) 5-325 MG tablet  Every 4 hours PRN        08/09/21 1103              Discharge Instructions      There are many causes of abdominal pain. Most pain is not serious and goes away, but some pain gets worse, changes, or will not go away. Please return to the emergency department or see your  doctor right away if you (or your family member) experience any of the following:  1. Pain that gets worse or moves to just one spot.  2. Pain that gets worse if you cough or sneeze.  3. Pain with going over a bump in the road.  4. Pain that does not get better in 24 hours.  5. Inability to keep down liquids (vomiting)-especially if you are making less urine.  6. Fainting.  7. Blood in the vomit or stool.  8. High fever or shaking chills.  9. Swelling of the abdomen.  10. Any new or worsening problem.      Follow-up Instructions  Return to the emergency department in 8-12 hours for recheck if worse.  See your primary care provider if not completely better in the next 2-3 days. Come to the ED if you are unable to see them in this time frame.    Additional Instructions  No alcohol.  No caffeine, aspirin, or cigarettes.   Please return to the emergency department immediately for any new or concerning symptoms, or if you get worse.          Jeanell Sparrow, DO 08/09/21 1106

## 2021-08-09 NOTE — ED Notes (Signed)
Patient transported to CT 

## 2021-08-09 NOTE — Discharge Instructions (Addendum)
There are many causes of abdominal pain. Most pain is not serious and goes away, but some pain gets worse, changes, or will not go away. Please return to the emergency department or see your doctor right away if you (or your family member) experience any of the following:  1. Pain that gets worse or moves to just one spot.  2. Pain that gets worse if you cough or sneeze.  3. Pain with going over a bump in the road.  4. Pain that does not get better in 24 hours.  5. Inability to keep down liquids (vomiting)-especially if you are making less urine.  6. Fainting.  7. Blood in the vomit or stool.  8. High fever or shaking chills.  9. Swelling of the abdomen.  10. Any new or worsening problem.      Follow-up Instructions  Return to the emergency department in 8-12 hours for recheck if worse.  See your primary care provider if not completely better in the next 2-3 days. Come to the ED if you are unable to see them in this time frame.    Additional Instructions  No alcohol.  No caffeine, aspirin, or cigarettes.   Please return to the emergency department immediately for any new or concerning symptoms, or if you get worse.

## 2021-08-09 NOTE — ED Triage Notes (Signed)
R flank pain. Hx of cancer. MD in room.

## 2021-08-09 NOTE — ED Provider Notes (Signed)
[facility] DEPT Provider Note   CSN: [id number] Arrival date & time: 08/09/21  0137     History No chief complaint on file.   Cynthia [name] is a 40 y.o. female.  The history is provided by the patient.  She has history of pancreatic cancer, biliary stent, pulmonary embolism anticoagulated on enoxaparin and comes in with a 1 week history of right upper quadrant pain which is worse after eating.  There is associated nausea with intermittent vomiting.  She has been taking oxycodone 5 mg without relief.  She denies fever, chills, sweats.  She had been seen at [facility] emergency department 3 days ago for similar complaints.  She is currently undergoing chemotherapy with the hope of shrinking her pancreatic tumor and proceeding with surgical cure.   Past Medical History:  Diagnosis Date   History of pulmonary embolism    Normal pregnancy in multigravida in third trimester 07/18/2018   Pancreatic cancer ([place])    Pancreatitis, acute 12/04/2020   Tobacco abuse     Patient Active Problem List   Diagnosis Date Noted   Genetic testing 01/14/2021   Family history of gastric cancer 12/23/2020   Family history of lung cancer 12/23/2020   Pancreatic cancer ([place]) 12/10/2020   Obstructive jaundice 11/30/2020   Pancreatic mass 11/30/2020   History of pulmonary embolus (PE) 11/30/2020   Transaminitis 11/30/2020   Abnormal MRI of the abdomen    Biliary obstruction    Acute pancreatitis 11/26/2020   Choledocholithiasis 11/25/2020   Cholelithiasis 11/25/2020   Pancreatitis 11/25/2020   Diarrhea 11/25/2020   First trimester pregnancy    Status post repeat low transverse cesarean section 08/03/2018   Pregnancy 07/18/2018   Sinus bradycardia 05/26/2015   PE (pulmonary embolism) 05/25/2015   Cigarette smoker 05/25/2015   Pulmonary emboli ([place])    H/O cesarean section 02/27/2015    Past Surgical History:  Procedure Laterality Date    BIOPSY  12/02/2020   Procedure: BIOPSY;  Surgeon: Irving Copas., MD;  Location: [facility];  Service: Gastroenterology;;   CESAREAN SECTION     CESAREAN SECTION N/A 02/27/2015   Procedure: CESAREAN SECTION;  Surgeon: Newton Pigg, MD;  Location: [facility];  Service: Obstetrics;  Laterality: N/A;   CESAREAN SECTION N/A 08/03/2018   Procedure: REPEAT CESAREAN SECTION;  Surgeon: Janyth Contes, MD;  Location: [facility];  Service: Obstetrics;  Laterality: N/ANira Conn, RNFA   ERCP N/A 12/02/2020   Procedure: ENDOSCOPIC RETROGRADE CHOLANGIOPANCREATOGRAPHY (ERCP);  Surgeon: Irving Copas., MD;  Location: [facility];  Service: Gastroenterology;  Laterality: N/A;   ESOPHAGOGASTRODUODENOSCOPY N/A 12/02/2020   Procedure: ESOPHAGOGASTRODUODENOSCOPY (EGD);  Surgeon: Irving Copas., MD;  Location: [place];  Service: Gastroenterology;  Laterality: N/A;   FINE NEEDLE ASPIRATION  12/02/2020   Procedure: FINE NEEDLE ASPIRATION (FNA) LINEAR;  Surgeon: Irving Copas., MD;  Location: [facility];  Service: Gastroenterology;;   UPPER ESOPHAGEAL ENDOSCOPIC ULTRASOUND (EUS) N/A 12/02/2020   Procedure: UPPER ESOPHAGEAL ENDOSCOPIC ULTRASOUND (EUS);  Surgeon: Irving Copas., MD;  Location: [place];  Service: Gastroenterology;  Laterality: N/A;     OB History     Gravida  9   Para  9   Term  9   Preterm      AB      Living  9      SAB      IAB      Ectopic      Multiple  0  Live Births  71           Family History  Problem Relation Age of Onset   Hypertension Mother    Hypertension Father    Gastric cancer Maternal Grandmother        dx unknown age   Lung cancer Paternal Grandfather        dx after 39; smoking hx    Social History   Tobacco Use   Smoking status: Every Day    Packs/day: 0.50    Years: 13.00    Pack years: 6.50    Types: Cigarettes   Smokeless tobacco: Never  Vaping Use   Vaping Use: Never  used  Substance Use Topics   Alcohol use: No    Alcohol/week: 0.0 standard drinks   Drug use: No    Home Medications Prior to Admission medications   Medication Sig Start Date End Date Taking? Authorizing Provider  diazepam (VALIUM) 5 MG tablet Take 1 tablet (5 mg total) by mouth every 8 (eight) hours as needed for muscle spasms. 02/13/21 02/13/22  Fransico Meadow, PA-C  docusate sodium (COLACE) 100 MG capsule Take 1 capsule (100 mg total) by mouth 2 (two) times daily. 12/05/20   Lavina Hamman, MD  oxyCODONE (OXY IR/ROXICODONE) 5 MG immediate release tablet Take 1 tablet (5 mg total) by mouth every 4 (four) hours as needed for severe pain. 12/10/20   Truitt Merle, MD  oxyCODONE-acetaminophen (PERCOCET/ROXICET) 5-325 MG tablet Take 1 tablet by mouth every 4 (four) hours as needed for severe pain. 12/07/20   Charlann Lange, PA-C  Prenatal Multivit-Min-Fe-FA (PRE-NATAL FORMULA) TABS Take 1 tablet by mouth daily. 12/05/20   Lavina Hamman, MD  enoxaparin (LOVENOX) 40 MG/0.4ML injection Inject 40 mg into the skin daily.  09/21/20  [provider]    Allergies    Patient has no known allergies.  Review of Systems   Review of Systems  All other systems reviewed and are negative.  Physical Exam Updated Vital Signs BP (!) 133/91 (BP Location: Left Arm)   Pulse 79   Temp 98.3 F (36.8 C) (Oral)   Resp 18   Ht 5\' 5"  (1.651 m)   Wt 65.8 kg   SpO2 100%   BMI 24.13 kg/m   Physical Exam Vitals and nursing note reviewed.  40 year old female, resting comfortably and in no acute distress. Vital signs are significant for borderline elevated diastolic blood pressure. Oxygen saturation is 100%, which is normal. Head is normocephalic and atraumatic. PERRLA, EOMI. Oropharynx is clear. Neck is nontender and supple without adenopathy or JVD. Back is nontender and there is no CVA tenderness. Lungs are clear without rales, wheezes, or rhonchi. Chest is nontender. Heart has regular rate and rhythm  without murmur. Abdomen is soft, flat, with moderate right upper quadrant tenderness.  There are no masses or hepatosplenomegaly and peristalsis is hypoactive. Extremities have no cyanosis or edema, full range of motion is present. Skin is warm and dry without rash. Neurologic: Mental status is normal, cranial nerves are intact, there are no motor or sensory deficits.  ED Results / Procedures / Treatments   Labs (all labs ordered are listed, but only abnormal results are displayed) Labs Reviewed  COMPREHENSIVE METABOLIC PANEL - Abnormal; Notable for the following components:      Result Value   Sodium 133 (*)    Glucose, Bld 104 (*)    Calcium 8.8 (*)    All other components within normal limits  HCG, QUANTITATIVE, PREGNANCY - Abnormal; Notable for the following components:   hCG, Beta Chain, Quant, S 7 (*)    All other components within normal limits  I-STAT BETA HCG BLOOD, ED (MC, [facility], AP ONLY) - Abnormal; Notable for the following components:   I-stat hCG, quantitative 6.1 (*)    All other components within normal limits  LIPASE, BLOOD  CBC WITH DIFFERENTIAL/PLATELET  URINALYSIS, ROUTINE W REFLEX MICROSCOPIC   Radiology CT ABDOMEN PELVIS W CONTRAST  Result Date: 08/09/2021 CLINICAL DATA:  40 year old female with history of abdominal pain. History of pancreatic cancer. Follow-up study. EXAM: CT ABDOMEN AND PELVIS WITH CONTRAST TECHNIQUE: Multidetector CT imaging of the abdomen and pelvis was performed using the standard protocol following bolus administration of intravenous contrast. CONTRAST:  52mL OMNIPAQUE IOHEXOL 350 MG/ML SOLN COMPARISON:  CT of the abdomen and pelvis 06/09/2021. FINDINGS: Lower chest: Unremarkable. Hepatobiliary: No suspicious cystic or solid hepatic lesions. No intra or extrahepatic biliary ductal dilatation. Pneumobilia both within the intrahepatic biliary tree, common bile duct and gallbladder. Gallbladder is otherwise unremarkable in appearance. Common bile  duct stent in the distal common bile duct. Pancreas: In the anterior aspect of the pancreatic head (axial image 27 of series 2 and coronal image 48 of series 5 there is a hypovascular mass estimated to measure 3.4 x 1.9 x 3.3 cm which makes contact with the right side of the superior mesenteric vein from approximally 7-9 o'clock, and is in contact with the common bile duct. There is also some hypovascular areas in the posterior aspect of the pancreatic head and uncinate process as well, visualized on axial image 26 of series 2, which may represent an additional component of the mass (this region measures approximately 2.8 x 1.7 cm independent of the previously described lesion), although it is difficult to directly connect both areas to ensure that this is one contiguous mass on both sides of the common bile duct. The body and tail of the pancreas are completely atrophic, with severe dilatation of the main pancreatic duct which measures up to 1.2 cm in diameter. No peripancreatic fluid collections or inflammatory changes. Spleen: Unremarkable. Adrenals/Urinary Tract: Bilateral kidneys and adrenal glands are normal in appearance. No hydroureteronephrosis. Urinary bladder is normal in appearance. Stomach/Bowel: Normal appearance of the stomach. No pathologic dilatation of small bowel or colon. Appendicolith near the neck of the appendix. The appendix is otherwise normal in appearance. Vascular/Lymphatic: Mild aortic atherosclerosis. No aneurysm or dissection noted in the abdominal or pelvic vasculature. As discussed above, the pancreatic mass comes in direct contact with the superior mesenteric vein. The lesion is well separated from the superior mesenteric artery and the common hepatic artery. No definite lymphadenopathy confidently identified in the abdomen or pelvis. Reproductive: Uterus and ovaries are unremarkable in appearance. Other: No significant volume of ascites.  No pneumoperitoneum. Musculoskeletal: There  are no aggressive appearing lytic or blastic lesions noted in the visualized portions of the skeleton. IMPRESSION: 1. No definite acute findings are noted in the abdomen or pelvis to account for the patient's symptoms. 2. Large hypovascular areas in the pancreatic head, as above, compatible with known pancreatic adenocarcinoma. This appears progressively enlarged compared to the prior study, as detailed above. 3. Aortic atherosclerosis. 4. Additional incidental findings, as above. Electronically Signed   By: Vinnie Langton M.D.   On: 08/09/2021 06:34    Procedures Procedures   Medications Ordered in ED Medications  famotidine (PEPCID) IVPB 20 mg premix (has no administration in time range)  alum &  mag hydroxide-simeth (MAALOX/MYLANTA) 200-200-20 MG/5ML suspension 15 mL (has no administration in time range)  HYDROcodone-acetaminophen (NORCO/VICODIN) 5-325 MG per tablet 1 tablet (has no administration in time range)  alum & mag hydroxide-simeth (MAALOX/MYLANTA) 200-200-20 MG/5ML suspension 30 mL (has no administration in time range)  pantoprazole (PROTONIX) injection 40 mg (has no administration in time range)  morphine 4 MG/ML injection 4 mg (4 mg Intravenous Given 08/09/21 0233)  ondansetron (ZOFRAN) injection 4 mg (4 mg Intravenous Given 08/09/21 0233)  morphine 4 MG/ML injection 4 mg (4 mg Intravenous Given 08/09/21 0359)  iohexol (OMNIPAQUE) 350 MG/ML injection 80 mL (80 mLs Intravenous Contrast Given 08/09/21 5053)    ED Course  I have reviewed the triage vital signs and the nursing notes.  Pertinent labs & imaging results that were available during my care of the patient were reviewed by me and considered in my medical decision making (see chart for details).  Clinical Course as of 08/09/21 0739  Sat Aug 09, 2021  9767 Pancreatic ca with biliary stent, RUQ pain worse with PO. CT stable, labs stable.  [SG]    Clinical Course User Index [SG] Jeanell Sparrow, DO   MDM  Rules/Calculators/A&P                         Right upper quadrant pain likely related to her known pancreatic cancer and biliary stent.  She is anticoagulated, pulmonary embolism unlikely.  Old records reviewed confirming recent ED visit for similar pain with CT scan at that time showing mass in the pancreatic head with pancreatic ductal dilatation but no significant hepatic duct dilatation, labs showed normal transaminases, alkaline phosphatase, bilirubin.  We will repeat metabolic panel and lipase, sent for CT of abdomen and pelvis.  If no significant ductal dilatation, she likely will just need an increased in her pain medication while pursuing chemotherapy.  Labs are unremarkable.  Minimal hyponatremia is present which is not felt to be clinically significant.  CT scan shows presence of known pancreatic tumor, but no acute findings to explain her pain.  She was given a dose of morphine with minimal relief of pain.  She will be given a dose of hydromorphone as well as pantoprazole and antacid.  Goal is to get adequate pain relief for discharge.  Case is signed out to Dr. Pearline Cables.  Final Clinical Impression(s) / ED Diagnoses Final diagnoses:  Upper abdominal pain  Hyponatremia    Rx / DC Orders ED Discharge Orders     None        Delora Fuel, MD 34/19/37 0740

## 2021-08-10 LAB — URINE CULTURE: Culture: NO GROWTH

## 2021-10-23 ENCOUNTER — Inpatient Hospital Stay (HOSPITAL_COMMUNITY)
Admission: EM | Admit: 2021-10-23 | Discharge: 2021-10-26 | DRG: 947 | Disposition: A | Discharge status: Home / Self Care | Payer: Medicaid Other | Attending: Family Medicine | Admitting: Family Medicine

## 2021-10-23 ENCOUNTER — Emergency Department (HOSPITAL_COMMUNITY): Payer: Medicaid Other

## 2021-10-23 ENCOUNTER — Other Ambulatory Visit: Payer: Self-pay

## 2021-10-23 ENCOUNTER — Encounter (HOSPITAL_COMMUNITY): Payer: Self-pay

## 2021-10-23 DIAGNOSIS — Z79899 Other long term (current) drug therapy: Secondary | ICD-10-CM

## 2021-10-23 DIAGNOSIS — T451X5A Adverse effect of antineoplastic and immunosuppressive drugs, initial encounter: Secondary | ICD-10-CM | POA: Diagnosis present

## 2021-10-23 DIAGNOSIS — I2699 Other pulmonary embolism without acute cor pulmonale: Secondary | ICD-10-CM | POA: Diagnosis present

## 2021-10-23 DIAGNOSIS — G893 Neoplasm related pain (acute) (chronic): Principal | ICD-10-CM | POA: Diagnosis present

## 2021-10-23 DIAGNOSIS — R599 Enlarged lymph nodes, unspecified: Secondary | ICD-10-CM | POA: Diagnosis present

## 2021-10-23 DIAGNOSIS — Z801 Family history of malignant neoplasm of trachea, bronchus and lung: Secondary | ICD-10-CM

## 2021-10-23 DIAGNOSIS — D649 Anemia, unspecified: Secondary | ICD-10-CM | POA: Diagnosis present

## 2021-10-23 DIAGNOSIS — E876 Hypokalemia: Secondary | ICD-10-CM | POA: Diagnosis present

## 2021-10-23 DIAGNOSIS — E440 Moderate protein-calorie malnutrition: Secondary | ICD-10-CM | POA: Diagnosis present

## 2021-10-23 DIAGNOSIS — Z20822 Contact with and (suspected) exposure to covid-19: Secondary | ICD-10-CM | POA: Diagnosis present

## 2021-10-23 DIAGNOSIS — Z8249 Family history of ischemic heart disease and other diseases of the circulatory system: Secondary | ICD-10-CM

## 2021-10-23 DIAGNOSIS — C250 Malignant neoplasm of head of pancreas: Secondary | ICD-10-CM | POA: Diagnosis present

## 2021-10-23 DIAGNOSIS — R109 Unspecified abdominal pain: Secondary | ICD-10-CM | POA: Diagnosis not present

## 2021-10-23 DIAGNOSIS — C259 Malignant neoplasm of pancreas, unspecified: Secondary | ICD-10-CM | POA: Diagnosis present

## 2021-10-23 DIAGNOSIS — D630 Anemia in neoplastic disease: Secondary | ICD-10-CM | POA: Diagnosis present

## 2021-10-23 DIAGNOSIS — R188 Other ascites: Secondary | ICD-10-CM | POA: Diagnosis present

## 2021-10-23 DIAGNOSIS — F17210 Nicotine dependence, cigarettes, uncomplicated: Secondary | ICD-10-CM | POA: Diagnosis present

## 2021-10-23 DIAGNOSIS — Z681 Body mass index (BMI) 19 or less, adult: Secondary | ICD-10-CM

## 2021-10-23 DIAGNOSIS — Z800 Family history of malignant neoplasm of digestive organs: Secondary | ICD-10-CM

## 2021-10-23 DIAGNOSIS — K769 Liver disease, unspecified: Secondary | ICD-10-CM | POA: Diagnosis present

## 2021-10-23 DIAGNOSIS — Z86711 Personal history of pulmonary embolism: Secondary | ICD-10-CM

## 2021-10-23 DIAGNOSIS — K859 Acute pancreatitis without necrosis or infection, unspecified: Secondary | ICD-10-CM | POA: Diagnosis present

## 2021-10-23 DIAGNOSIS — K521 Toxic gastroenteritis and colitis: Secondary | ICD-10-CM | POA: Diagnosis present

## 2021-10-23 LAB — PREGNANCY, URINE: Preg Test, Ur: NEGATIVE

## 2021-10-23 LAB — URINALYSIS, ROUTINE W REFLEX MICROSCOPIC
Bilirubin Urine: NEGATIVE
Glucose, UA: NEGATIVE mg/dL
Hgb urine dipstick: NEGATIVE
Ketones, ur: NEGATIVE mg/dL
Leukocytes,Ua: NEGATIVE
Nitrite: NEGATIVE
Protein, ur: NEGATIVE mg/dL
Specific Gravity, Urine: 1.03 (ref 1.005–1.030)
pH: 6 (ref 5.0–8.0)

## 2021-10-23 LAB — LIPASE, BLOOD: Lipase: 23 U/L (ref 11–51)

## 2021-10-23 LAB — COMPREHENSIVE METABOLIC PANEL
ALT: 20 U/L (ref 0–44)
AST: 48 U/L — ABNORMAL HIGH (ref 15–41)
Albumin: 2.8 g/dL — ABNORMAL LOW (ref 3.5–5.0)
Alkaline Phosphatase: 91 U/L (ref 38–126)
Anion gap: 5 (ref 5–15)
BUN: 8 mg/dL (ref 6–20)
CO2: 24 mmol/L (ref 22–32)
Calcium: 7.6 mg/dL — ABNORMAL LOW (ref 8.9–10.3)
Chloride: 105 mmol/L (ref 98–111)
Creatinine, Ser: 0.34 mg/dL — ABNORMAL LOW (ref 0.44–1.00)
GFR, Estimated: >60 mL/min (ref >60)
Glucose, Bld: 95 mg/dL (ref 70–99)
Potassium: 3 mmol/L — ABNORMAL LOW (ref 3.5–5.1)
Sodium: 134 mmol/L — ABNORMAL LOW (ref 135–145)
Total Bilirubin: 0.3 mg/dL (ref 0.3–1.2)
Total Protein: 5.3 g/dL — ABNORMAL LOW (ref 6.5–8.1)

## 2021-10-23 LAB — CBC
HCT: 27 % — ABNORMAL LOW (ref 36.0–46.0)
Hemoglobin: 9.2 g/dL — ABNORMAL LOW (ref 12.0–15.0)
MCH: 29.3 pg (ref 26.0–34.0)
MCHC: 34.1 g/dL (ref 30.0–36.0)
MCV: 86 fL (ref 80.0–100.0)
Platelets: 280 10*3/uL (ref 150–400)
RBC: 3.14 MIL/uL — ABNORMAL LOW (ref 3.87–5.11)
RDW: 14.5 % (ref 11.5–15.5)
WBC: 5.3 10*3/uL (ref 4.0–10.5)
nRBC: 0 % (ref 0.0–0.2)

## 2021-10-23 LAB — HCG, QUANTITATIVE, PREGNANCY: hCG, Beta Chain, Quant, S: 12 m[IU]/mL — ABNORMAL HIGH (ref <5)

## 2021-10-23 LAB — RESP PANEL BY RT-PCR (FLU A&B, COVID) ARPGX2
Influenza A by PCR: NEGATIVE
Influenza B by PCR: NEGATIVE
SARS Coronavirus 2 by RT PCR: NEGATIVE

## 2021-10-23 MED ORDER — ONDANSETRON HCL 4 MG/2ML IJ SOLN
4.0000 mg | Freq: Once | INTRAMUSCULAR | Status: AC
  Administered 2021-10-23: 4 mg via INTRAVENOUS
  Filled 2021-10-23: qty 2

## 2021-10-23 MED ORDER — POTASSIUM CHLORIDE IN NACL 20-0.9 MEQ/L-% IV SOLN
INTRAVENOUS | Status: DC
  Filled 2021-10-23 (×2): qty 1000

## 2021-10-23 MED ORDER — HYDROMORPHONE HCL 1 MG/ML IJ SOLN: 1.0000 mg | INTRAMUSCULAR | Status: DC | PRN

## 2021-10-23 MED ORDER — POTASSIUM CHLORIDE CRYS ER 20 MEQ PO TBCR
60.0000 meq | EXTENDED_RELEASE_TABLET | Freq: Once | ORAL | Status: AC
  Administered 2021-10-23: 60 meq via ORAL
  Filled 2021-10-23: qty 3

## 2021-10-23 MED ORDER — HYDROMORPHONE HCL 1 MG/ML IJ SOLN
1.0000 mg | Freq: Once | INTRAMUSCULAR | Status: AC
  Administered 2021-10-23: 1 mg via INTRAVENOUS
  Filled 2021-10-23: qty 1

## 2021-10-23 MED ORDER — ENOXAPARIN SODIUM 80 MG/0.8ML IJ SOSY
80.0000 mg | PREFILLED_SYRINGE | INTRAMUSCULAR | Status: DC
  Administered 2021-10-23 – 2021-10-24 (×2): 80 mg via SUBCUTANEOUS
  Filled 2021-10-23 (×4): qty 0.8

## 2021-10-23 MED ORDER — HYDROMORPHONE HCL 1 MG/ML IJ SOLN
INTRAMUSCULAR | Status: AC
  Administered 2021-10-23: 1 mg via INTRAVENOUS
  Filled 2021-10-23: qty 1

## 2021-10-23 MED ORDER — HYDROMORPHONE HCL 1 MG/ML IJ SOLN
1.0000 mg | Freq: Once | INTRAMUSCULAR | Status: AC
  Filled 2021-10-23: qty 1

## 2021-10-23 MED ORDER — SODIUM CHLORIDE 0.9 % IV BOLUS
1000.0000 mL | Freq: Once | INTRAVENOUS | Status: AC
  Administered 2021-10-23: 1000 mL via INTRAVENOUS

## 2021-10-23 MED ORDER — HYDROMORPHONE HCL 2 MG/ML IJ SOLN
2.0000 mg | Freq: Once | INTRAMUSCULAR | Status: AC
  Administered 2021-10-23: 2 mg via INTRAVENOUS
  Filled 2021-10-23: qty 1

## 2021-10-23 MED ORDER — ONDANSETRON HCL 4 MG PO TABS: 4.0000 mg | ORAL_TABLET | Freq: Four times a day (QID) | ORAL | Status: DC | PRN

## 2021-10-23 MED ORDER — PANTOPRAZOLE SODIUM 40 MG IV SOLR
40.0000 mg | Freq: Every day | INTRAVENOUS | Status: DC
  Administered 2021-10-23 – 2021-10-26 (×4): 40 mg via INTRAVENOUS
  Filled 2021-10-23 (×4): qty 10

## 2021-10-23 MED ORDER — CHLORHEXIDINE GLUCONATE CLOTH 2 % EX PADS
6.0000 | MEDICATED_PAD | Freq: Every day | CUTANEOUS | Status: DC
  Administered 2021-10-24 – 2021-10-26 (×3): 6 via TOPICAL

## 2021-10-23 MED ORDER — IOHEXOL 300 MG/ML  SOLN
100.0000 mL | Freq: Once | INTRAMUSCULAR | Status: AC | PRN
  Administered 2021-10-23: 100 mL via INTRAVENOUS

## 2021-10-23 MED ORDER — SODIUM CHLORIDE 0.9 % IV SOLN: INTRAVENOUS | Status: DC

## 2021-10-23 MED ORDER — HYDROMORPHONE HCL 1 MG/ML IJ SOLN
1.0000 mg | INTRAMUSCULAR | Status: DC | PRN
  Administered 2021-10-23 – 2021-10-25 (×20): 1 mg via INTRAVENOUS
  Filled 2021-10-23 (×20): qty 1

## 2021-10-23 MED ORDER — ONDANSETRON HCL 4 MG/2ML IJ SOLN: 4.0000 mg | Freq: Four times a day (QID) | INTRAMUSCULAR | Status: DC | PRN

## 2021-10-23 NOTE — ED Provider Notes (Signed)
Care assumed from PA [facility] at shift change, please see his note for full details, but in brief Cynthia [name] is a 41 y.o. female with pancreatic cancer, with stent in place, currently receiving chemotherapy at [facility], last treatment on Friday.  Presents to the emergency department for periumbilical and upper abdominal pain that has been persistent all day yesterday.  No associated vomiting or dysuria, has chronic diarrhea since starting chemotherapy that has not acutely worsened or changed.  Took prescribed morphine at home without any significant relief in pain.  Labs unremarkable, CT pending at shift change  Physical Exam  BP 120/76    Pulse 66    Temp 98.3 F (36.8 C) (Oral)    Resp 20    SpO2 100%   Physical Exam Vitals and nursing note reviewed.  Constitutional:      General: She is not in acute distress.    Appearance: Normal appearance. She is well-developed. She is not ill-appearing or diaphoretic.  HENT:     Head: Normocephalic and atraumatic.  Eyes:     General:        Right eye: No discharge.        Left eye: No discharge.  Pulmonary:     Effort: Pulmonary effort is normal. No respiratory distress.  Abdominal:     General: Bowel sounds are normal. There is no distension.     Palpations: Abdomen is soft.     Tenderness: There is abdominal tenderness.     Comments: Abdomen soft, nondistended, bowel sounds present, upper abdominal and periumbilical tenderness present with slight guarding, no lower abdominal tenderness.  Neurological:     Mental Status: She is alert and oriented to person, place, and time.     Coordination: Coordination normal.  Psychiatric:        Mood and Affect: Mood normal.        Behavior: Behavior normal.     ED Course / MDM   Labs Reviewed  COMPREHENSIVE METABOLIC PANEL - Abnormal; Notable for the following components:      Result Value   Sodium 134 (*)    Potassium 3.0 (*)    Creatinine, Ser 0.34 (*)    Calcium 7.6 (*)    Total  Protein 5.3 (*)    Albumin 2.8 (*)    AST 48 (*)    All other components within normal limits  CBC - Abnormal; Notable for the following components:   RBC 3.14 (*)    Hemoglobin 9.2 (*)    HCT 27.0 (*)    All other components within normal limits  HCG, QUANTITATIVE, PREGNANCY - Abnormal; Notable for the following components:   hCG, Beta Chain, Quant, S 12 (*)    All other components within normal limits  RESP PANEL BY RT-PCR (FLU A&B, COVID) ARPGX2  LIPASE, BLOOD  URINALYSIS, ROUTINE W REFLEX MICROSCOPIC   CT ABDOMEN PELVIS W CONTRAST  Result Date: 10/23/2021 CLINICAL DATA:  Acute, nonlocalized abdominal pain. Under treatment for pancreas cancer EXAM: CT ABDOMEN AND PELVIS WITH CONTRAST TECHNIQUE: Multidetector CT imaging of the abdomen and pelvis was performed using the standard protocol following bolus administration of intravenous contrast. RADIATION DOSE REDUCTION: This exam was performed according to the departmental dose-optimization program which includes automated exposure control, adjustment of the mA and/or kV according to patient size and/or use of iterative reconstruction technique. CONTRAST:  153mL OMNIPAQUE IOHEXOL 300 MG/ML  SOLN COMPARISON:  08/09/2021 FINDINGS: Lower chest: Trace atelectasis at the posterior right costophrenic sulcus.  Hepatobiliary: 6 mm low-density in the posterior right liver, not seen previously or mentioned on outside abdominal CT report 10/02/2021. The size limits differentiation between mass and biliary cyst. Pneumobilia in the setting of metallic biliary stent, suggesting patency Pancreas: Known pancreas carcinoma at the head. More indistinct adjacent fat. No gross change in the size of a ill-defined low-density mass at the ventral pancreatic head. Main ductal dilatation which appears somewhat improved. Pancreatic body and tail atrophy. Staging considerations discussed on outside dedicated protocol. Spleen: Unremarkable. Adrenals/Urinary Tract: Negative  adrenals. No hydronephrosis or stone. Unremarkable bladder. Stomach/Bowel:  No obstruction. No visible bowel inflammation Vascular/Lymphatic: No acute vascular finding. Generous sized peripancreatic lymph nodes measuring up to 9 mm. Reproductive:No pathologic findings. Other: Small volume ascites in the pelvis with thin peritoneal enhancement. Musculoskeletal: No acute abnormalities. IMPRESSION: 1. More fat stranding around the pancreas, question acute pancreatitis. Unremarkable appearance of the stent with pneumobilia suggesting patency. 2. 6 mm low-density in the right liver, not mentioned on most recent staging scan at [facility] 10/02/2021, recommend oncology follow-up. 3. Small volume ascites in the pelvis with thin peritoneal enhancement. Fluid was noted on outside scan, attention on follow-up. 4. Prominent size of peripancreatic nodes when compared to 08/09/2021 scan. Electronically Signed   By: Jorje Guild M.D.   On: 10/23/2021 07:59     Medical Decision Making Amount and/or Complexity of Data Reviewed External Data Reviewed: labs, radiology and notes. Labs: ordered. Radiology: ordered and independent interpretation performed.  Risk Prescription drug management. Parenteral controlled substances. Decision regarding hospitalization.   Lab work reviewed, overall reassuring, no leukocytosis, hemoglobin at baseline, no significant electrolyte derangements and normal lipase.  I have personally reviewed and interpreted CT, this shows increased fat stranding around the pancreas, stent in place, 6 mm low-density in the right liver, not seen on prior scan, may be additional metastasis.  Small volume ascites.  Prominent peripancreatic nodes.  CT suggestive of pancreatitis which is likely the cause of patient's acute worsening of pain.  She does not have elevated lipase at this time although given underlying pathology may not be able to mount typical lipase elevation.  Patient has required multiple  doses of IV pain medication and does get some relief with her pain but then pain quickly returns, we will continue with pain control and IV fluids, will need admission for continued pain control and treatment of pancreatitis.  Discussed this with patient, she would prefer to stay here rather than be transferred to [facility].  I have consulted Dr. Olevia Bowens with [facility] hospitalist for admission, discussed pertinent labs, imaging and plan, he will see and admit the patient.   Final diagnoses:  Acute pancreatitis without infection or necrosis, unspecified pancreatitis type  Malignant neoplasm of pancreas, unspecified location of malignancy [facility])      Jacqlyn Larsen, PA-C 10/23/21 3149    Valarie Merino, MD 10/23/21 [phone]

## 2021-10-23 NOTE — ED Provider Notes (Signed)
[facility] DEPT Provider Note   CSN: [id number] Arrival date & time: 10/23/21  0059     History  Chief Complaint  Patient presents with   Abdominal Pain    RUQ    Cynthia [name] is a 41 y.o. female.  HPI Patient is a 41 year old female with a history of pancreatic cancer who presents to the emergency department due to epigastric pain and right upper quadrant pain.  Patient states that she is currently receiving chemotherapy at [facility] for her pancreatic cancer.  Last received chemotherapy 6 days ago.  She states that she recently began developing worsening upper abdominal pain.  States it is consistent with her prior bouts of upper abdominal pain.  Denies any nausea, vomiting, urinary complaints.  She has chronic diarrhea since starting chemotherapy and does not feel that it has acutely worsened.  She states she took a dose of her prescribed morphine just prior to arrival and denies any significant relief.    Home Medications Prior to Admission medications   Medication Sig Start Date End Date Taking? Authorizing Provider  CREON 24000-76000 units CPEP Take 1 capsule by mouth daily. 04/24/21   [provider]  dexamethasone (DECADRON) 4 MG tablet Take 8mg  (2 x 4mg  tablets) by mouth in the morning for 2 days after the first day of each cycle, then as directed. 07/17/21   [provider]  diazepam (VALIUM) 5 MG tablet Take 1 tablet (5 mg total) by mouth every 8 (eight) hours as needed for muscle spasms. Patient not taking: Reported on 08/09/2021 02/13/21 02/13/22  Fransico Meadow, PA-C  dicyclomine (BENTYL) 20 MG tablet Take 1 tablet (20 mg total) by mouth 2 (two) times daily as needed for up to 7 days for spasms. 08/09/21 08/16/21  Jeanell Sparrow, DO  docusate sodium (COLACE) 100 MG capsule Take 1 capsule (100 mg total) by mouth 2 (two) times daily. Patient taking differently: Take 100 mg by mouth daily as needed for mild constipation. 12/05/20    Lavina Hamman, MD  enoxaparin (LOVENOX) 80 MG/0.8ML injection Inject 80 mg into the skin daily. 05/28/21   [provider]  HYDROcodone-acetaminophen (NORCO/VICODIN) 5-325 MG tablet Take 1 tablet by mouth every 4 (four) hours as needed for up to 5 doses. 08/09/21   Jeanell Sparrow, DO  lidocaine-prilocaine (EMLA) cream Apply 1 application topically as needed (for port access). 05/23/21   [provider]  ondansetron (ZOFRAN-ODT) 8 MG disintegrating tablet Take 8 mg by mouth every 8 (eight) hours as needed for nausea. 07/16/21   [provider]  oxyCODONE (OXY IR/ROXICODONE) 5 MG immediate release tablet Take 1 tablet (5 mg total) by mouth every 4 (four) hours as needed for severe pain. 12/10/20   Truitt Merle, MD  pantoprazole (PROTONIX) 20 MG tablet Take 1 tablet (20 mg total) by mouth daily for 14 days. 08/09/21 08/23/21  Jeanell Sparrow, DO  prochlorperazine (COMPAZINE) 10 MG tablet Take 10 mg by mouth every 6 (six) hours as needed for nausea. 07/17/21   [provider]  sucralfate (CARAFATE) 1 g tablet Take 1 tablet (1 g total) by mouth 4 (four) times daily -  with meals and at bedtime for 14 days. 08/09/21 08/23/21  Jeanell Sparrow, DO  XTAMPZA ER 13.5 MG C12A Take 1 capsule by mouth 2 (two) times daily. 07/31/21   [provider]      Allergies    Patient has no known allergies.  Review of Systems   Review of Systems  All other systems reviewed and are negative. Ten systems reviewed and are negative for acute change, except as noted in the HPI.   Physical Exam Updated Vital Signs BP 95/70    Pulse 78    Temp 98.3 F (36.8 C) (Oral)    Resp 14    SpO2 100%  Physical Exam Vitals and nursing note reviewed.  Constitutional:      General: She is not in acute distress.    Appearance: Normal appearance. She is not ill-appearing, toxic-appearing or diaphoretic.  HENT:     Head: Normocephalic and atraumatic.     Right Ear: External ear normal.     Left  Ear: External ear normal.     Nose: Nose normal.     Mouth/Throat:     Mouth: Mucous membranes are moist.     Pharynx: Oropharynx is clear. No oropharyngeal exudate or posterior oropharyngeal erythema.  Eyes:     Extraocular Movements: Extraocular movements intact.  Cardiovascular:     Rate and Rhythm: Normal rate and regular rhythm.     Pulses: Normal pulses.     Heart sounds: Normal heart sounds. No murmur heard.   No friction rub. No gallop.  Pulmonary:     Effort: Pulmonary effort is normal. No respiratory distress.     Breath sounds: Normal breath sounds. No stridor. No wheezing, rhonchi or rales.  Abdominal:     General: Abdomen is flat.     Palpations: Abdomen is soft.     Tenderness: There is abdominal tenderness in the right upper quadrant and epigastric area.     Comments: Abdomen is flat and soft.  Moderate tenderness noted along the right upper quadrant and epigastrium.  Musculoskeletal:        General: Normal range of motion.     Cervical back: Normal range of motion and neck supple. No tenderness.  Skin:    General: Skin is warm and dry.  Neurological:     General: No focal deficit present.     Mental Status: She is alert and oriented to person, place, and time.  Psychiatric:        Mood and Affect: Mood normal.        Behavior: Behavior normal.   ED Results / Procedures / Treatments   Labs (all labs ordered are listed, but only abnormal results are displayed) Labs Reviewed  LIPASE, BLOOD  COMPREHENSIVE METABOLIC PANEL  CBC  URINALYSIS, ROUTINE W REFLEX MICROSCOPIC  I-STAT BETA HCG BLOOD, ED ([facility], [facility], [facility] ONLY)   EKG None  Radiology No results found.  Procedures Procedures   Medications Ordered in ED Medications  sodium chloride 0.9 % bolus 1,000 mL (has no administration in time range)  HYDROmorphone (DILAUDID) injection 1 mg (has no administration in time range)  HYDROmorphone (DILAUDID) 1 MG/ML injection (has no administration in time range)   potassium chloride SA (KLOR-CON M) CR tablet 60 mEq (has no administration in time range)   ED Course/ Medical Decision Making/ A&P                           Medical Decision Making Amount and/or Complexity of Data Reviewed Labs: ordered. Radiology: ordered.  Risk Prescription drug management.  Pt is a 41 y.o. female with history of pancreatic cancer on chemotherapy presents to the emergency department with upper abdominal pain.  Labs: CBC with an RBC count of 3.14, hemoglobin  of 9.2, hematocrit 27. CMP with a sodium of 134, potassium of 3, creatinine of 0.34, calcium of 7.6, total protein of 5.3, albumin of 2.8, AST of 48.  Imaging: CT scan of the abdomen/pelvis with contrast is pending.  I, Rayna Sexton, PA-C, personally reviewed and evaluated these images and lab results as part of my medical decision-making.  On my exam abdomen is soft.  Patient has moderate tenderness along the epigastrium and right upper quadrant.  No fevers, chills, nausea, vomiting, diarrhea.  Patient has a history of pancreatic cancer and is currently undergoing chemotherapy.  Feels that her pain is consistent with prior pain exacerbations in the upper abdomen.  Currently afebrile and not tachycardic.  Nontoxic-appearing.  Patient's pain was treated with IV Dilaudid with moderate improvement.  Also found to be hypokalemic at 3.0.  Patient was given 60 mEq of oral Klor-Con.  It is in my shift and patient care is being transferred to [facility].  Patient pending CT scan the abdomen/pelvis with contrast.  Disposition pending based on reassessment as well as CT imaging results.  Note: Portions of this report may have been transcribed using voice recognition software. Every effort was made to ensure accuracy; however, inadvertent computerized transcription errors may be present.   Final Clinical Impression(s) / ED Diagnoses Final diagnoses:  Pain of upper abdomen  Hypokalemia   Rx / DC Orders ED Discharge  Orders     None         Rayna Sexton, PA-C 10/23/21 5830    Merryl Hacker, MD 10/23/21 [id number]

## 2021-10-23 NOTE — H&P (Signed)
History and Physical    Patient: Cynthia [name] FFM:[id number] DOB: Nov 05, 1980 DOA: 10/23/2021 DOS: the patient was seen and examined on 10/23/2021 PCP: [facility]  Patient coming from: Home  Chief Complaint:  Chief Complaint  Patient presents with   Abdominal Pain    RUQ    HPI: Cynthia [name] is a 41 y.o. female with medical history significant of pulmonary embolism, pancreatic cancer and last received chemotherapy 6 days ago, tobacco abuse, acute pancreatitis is coming to the emergency department with abdominal pain for the past 2 days, but no nausea or vomiting.  No fever, chills, but decreased appetite.  She has been having diarrhea since, but also stated there is a "stomach bug" among family members.  Denied melena, hematochezia or constipation.  No flank pain, dysuria, frequency or hematuria.  No dyspnea, chest pain, palpitations, diaphoresis, PND, orthopnea or pitting edema lower extremities.  Denied polyuria, polydipsia, polyphagia or blurry vision.  ED course: Initial vital signs were temperature 98.3 F, pulse 78, respiration 14, BP is 95/70 mmHg O2 sat 100% on room air.  The patient received 2000 mL of NS bolus, hydromorphone 1 mg IVP, KCl 60 mEq p.o.  I added hydromorphone 2 mg and ondansetron 4 mg IVP.  Lab work: Her urinalysis, urine pregnancy test, lipase and COVID/influenza PCR was negative.  CBC is her white count 5.3, hemoglobin 9.2 g/dL platelets 200.  CMP showed a sodium of 134 and potassium of 3.0 mmol/L.  Renal function and the rest of the electrolytes are normal calcium is corrected.  Total protein 5.3, albumin 2.8 g/dL.  AST is slightly elevated, ALT, alkaline phosphatase and total bilirubin were normal.  Imaging: CT abdomen/pelvis with contrast showed marked fat stranding around the pancreas with a question of acute pancreatitis.  There was unremarkable appearance of the stent with pneumobilia.  New 6 mm low-density in the right liver, not mentioned  on most recent staging scan to, oncology follow recommended.  Prominent size of peripancreatic nodes and small ascites in the pelvis.  Please see images and full radiology report for further details.  Review of Systems: As mentioned in the history of present illness. All other systems reviewed and are negative. Past Medical History:  Diagnosis Date   History of pulmonary embolism    Normal pregnancy in multigravida in third trimester 07/18/2018   Pancreatic cancer ([place])    Pancreatitis, acute 12/04/2020   Tobacco abuse    Past Surgical History:  Procedure Laterality Date   BIOPSY  12/02/2020   Procedure: BIOPSY;  Surgeon: Rush Landmark Telford Nab., MD;  Location: [facility];  Service: Gastroenterology;;   [place] N/A 02/27/2015   Procedure: CESAREAN SECTION;  Surgeon: Newton Pigg, MD;  Location: [facility];  Service: Obstetrics;  Laterality: N/A;   CESAREAN SECTION N/A 08/03/2018   Procedure: REPEAT CESAREAN SECTION;  Surgeon: Janyth Contes, MD;  Location: [facility];  Service: Obstetrics;  Laterality: N/ANira Conn, RNFA   ERCP N/A 12/02/2020   Procedure: ENDOSCOPIC RETROGRADE CHOLANGIOPANCREATOGRAPHY (ERCP);  Surgeon: Irving Copas., MD;  Location: [facility];  Service: Gastroenterology;  Laterality: N/A;   ESOPHAGOGASTRODUODENOSCOPY N/A 12/02/2020   Procedure: ESOPHAGOGASTRODUODENOSCOPY (EGD);  Surgeon: Irving Copas., MD;  Location: [facility];  Service: Gastroenterology;  Laterality: N/A;   FINE NEEDLE ASPIRATION  12/02/2020   Procedure: FINE NEEDLE ASPIRATION (FNA) LINEAR;  Surgeon: Irving Copas., MD;  Location: [facility];  Service: Gastroenterology;;   UPPER ESOPHAGEAL ENDOSCOPIC  ULTRASOUND (EUS) N/A 12/02/2020   Procedure: UPPER ESOPHAGEAL ENDOSCOPIC ULTRASOUND (EUS);  Surgeon: Irving Copas., MD;  Location: [place] [facility];  Service: Gastroenterology;  Laterality: N/A;   Social History:  reports that  she has been smoking cigarettes. She has a 6.50 pack-year smoking history. She has never used smokeless tobacco. She reports that she does not drink alcohol and does not use drugs.  No Known Allergies  Family History  Problem Relation Age of Onset   Hypertension Mother    Hypertension Father    Gastric cancer Maternal Grandmother        dx unknown age   Lung cancer Paternal Grandfather        dx after 47; smoking hx    Prior to Admission medications   Medication Sig Start Date End Date Taking? Authorizing Provider  CREON 24000-76000 units CPEP Take 1 capsule by mouth daily. 04/24/21  Yes [provider]  dexamethasone (DECADRON) 4 MG tablet Take 8mg  (2 x 4mg  tablets) by mouth in the morning for 2 days after the first day of each cycle, then as directed. 07/17/21  Yes [provider]  diazepam (VALIUM) 5 MG tablet Take 1 tablet (5 mg total) by mouth every 8 (eight) hours as needed for muscle spasms. 02/13/21 02/13/22 Yes Caryl Ada K, PA-C  docusate sodium (COLACE) 100 MG capsule Take 1 capsule (100 mg total) by mouth 2 (two) times daily. Patient taking differently: Take 100 mg by mouth daily as needed for mild constipation. 12/05/20  Yes Lavina Hamman, MD  enoxaparin (LOVENOX) 80 MG/0.8ML injection Inject 80 mg into the skin daily. 05/28/21  Yes [provider]  ibuprofen (ADVIL) 200 MG tablet Take 400 mg by mouth every 6 (six) hours as needed for mild pain.   Yes [provider]  lidocaine-prilocaine (EMLA) cream Apply 1 application topically as needed (for port access). 05/23/21  Yes [provider]  morphine (MS CONTIN) 30 MG 12 hr tablet Take 30 mg by mouth every 8 (eight) hours as needed for pain. 10/30/21 12/25/21 Yes [provider]  morphine (MSIR) 15 MG tablet Take 15 mg by mouth every 4 (four) hours as needed for pain. 10/30/21 11/29/21 Yes [provider]  ondansetron (ZOFRAN-ODT) 8 MG disintegrating tablet Take 8 mg by mouth  every 8 (eight) hours as needed for nausea. 07/16/21  Yes [provider]  prochlorperazine (COMPAZINE) 10 MG tablet Take 10 mg by mouth every 6 (six) hours as needed for nausea. 07/17/21  Yes [provider]  dicyclomine (BENTYL) 20 MG tablet Take 1 tablet (20 mg total) by mouth 2 (two) times daily as needed for up to 7 days for spasms. 08/09/21 08/16/21  Jeanell Sparrow, DO  sucralfate (CARAFATE) 1 g tablet Take 1 tablet (1 g total) by mouth 4 (four) times daily -  with meals and at bedtime for 14 days. 08/09/21 08/23/21  Jeanell Sparrow, DO    Physical Exam: Vitals:   10/23/21 1349 10/23/21 1446 10/23/21 1515 10/23/21 1723  BP:  134/80  124/81  Pulse:  (!) 55  (!) 57  Resp:  18  20  Temp:  98.6 F (37 C)    TempSrc:  Oral    SpO2:  100%  100%  Weight: 72.6 kg  52.6 kg   Height:   5\' 5"  (1.651 m)    Physical Exam Constitutional:      Appearance: She is well-developed.  HENT:     Head: Normocephalic and  atraumatic.     Mouth/Throat:     Mouth: Mucous membranes are moist.  Eyes:     Pupils: Pupils are equal, round, and reactive to light.  Cardiovascular:     Rate and Rhythm: Normal rate and regular rhythm.  Abdominal:     General: Bowel sounds are normal. There is no distension.     Palpations: Abdomen is soft.     Tenderness: There is abdominal tenderness in the epigastric area. There is no guarding or rebound.  Skin:    General: Skin is warm and dry.  Neurological:     General: No focal deficit present.     Mental Status: She is alert and oriented to person, place, and time.     Data Reviewed:  There are no new results to review at this time.  Assessment and Plan: Principal Problem:   Intractable abdominal pain In the setting of   Pancreatic cancer [facility]) With imaging suspicious for acute pancreatitis. However the patient has normal lipase. Place in observation/MedSurg. Keep NPO. Continue IV fluids. Analgesics and antiemetics as  needed. Pantoprazole 40 mg IV every 24 hours. Follow-up CBC, CMP and lipase in the morning.  Active Problems:   Pulmonary emboli (HCC) Continue SQ Lovenox. Tobacco cessation recommended.    Hypokalemia Replacing. Follow-up potassium level in a.m.    Moderate protein malnutrition ([place]) In the setting of pancreatic cancer.    Normocytic anemia Monitor hematocrit and hemoglobin.    Advance Care Planning:   Code Status: Full Code   Consults:   Family Communication:   Severity of Illness: The appropriate patient status for this patient is OBSERVATION. Observation status is judged to be reasonable and necessary in order to provide the required intensity of service to ensure the patient's safety. The patient's presenting symptoms, physical exam findings, and initial radiographic and laboratory data in the context of their medical condition is felt to place them at decreased risk for further clinical deterioration. Furthermore, it is anticipated that the patient will be medically stable for discharge from the hospital within 2 midnights of admission.   Author: Reubin Milan, MD 10/23/2021 9:08 PM  For on call review [email or web address].

## 2021-10-23 NOTE — ED Triage Notes (Addendum)
Pt BIBA from home with RUQ pain on and off all day. States that many people in her family currently have the "stomach bug". Currently being treated for pancreatic cancer- last chemo treatment was Friday. Took prescribed morphine tablet 1hr PTA

## 2021-10-24 DIAGNOSIS — Z800 Family history of malignant neoplasm of digestive organs: Secondary | ICD-10-CM | POA: Diagnosis not present

## 2021-10-24 DIAGNOSIS — F17210 Nicotine dependence, cigarettes, uncomplicated: Secondary | ICD-10-CM | POA: Diagnosis present

## 2021-10-24 DIAGNOSIS — G893 Neoplasm related pain (acute) (chronic): Secondary | ICD-10-CM | POA: Diagnosis not present

## 2021-10-24 DIAGNOSIS — R188 Other ascites: Secondary | ICD-10-CM | POA: Diagnosis present

## 2021-10-24 DIAGNOSIS — K859 Acute pancreatitis without necrosis or infection, unspecified: Secondary | ICD-10-CM | POA: Diagnosis present

## 2021-10-24 DIAGNOSIS — Z79899 Other long term (current) drug therapy: Secondary | ICD-10-CM | POA: Diagnosis not present

## 2021-10-24 DIAGNOSIS — R109 Unspecified abdominal pain: Secondary | ICD-10-CM | POA: Diagnosis present

## 2021-10-24 DIAGNOSIS — E876 Hypokalemia: Secondary | ICD-10-CM | POA: Diagnosis present

## 2021-10-24 DIAGNOSIS — C250 Malignant neoplasm of head of pancreas: Secondary | ICD-10-CM | POA: Diagnosis present

## 2021-10-24 DIAGNOSIS — K521 Toxic gastroenteritis and colitis: Secondary | ICD-10-CM | POA: Diagnosis present

## 2021-10-24 DIAGNOSIS — Z20822 Contact with and (suspected) exposure to covid-19: Secondary | ICD-10-CM | POA: Diagnosis present

## 2021-10-24 DIAGNOSIS — D630 Anemia in neoplastic disease: Secondary | ICD-10-CM | POA: Diagnosis present

## 2021-10-24 DIAGNOSIS — Z681 Body mass index (BMI) 19 or less, adult: Secondary | ICD-10-CM | POA: Diagnosis not present

## 2021-10-24 DIAGNOSIS — T451X5A Adverse effect of antineoplastic and immunosuppressive drugs, initial encounter: Secondary | ICD-10-CM | POA: Diagnosis present

## 2021-10-24 DIAGNOSIS — Z86711 Personal history of pulmonary embolism: Secondary | ICD-10-CM | POA: Diagnosis not present

## 2021-10-24 DIAGNOSIS — K769 Liver disease, unspecified: Secondary | ICD-10-CM | POA: Diagnosis present

## 2021-10-24 DIAGNOSIS — R599 Enlarged lymph nodes, unspecified: Secondary | ICD-10-CM | POA: Diagnosis present

## 2021-10-24 DIAGNOSIS — E440 Moderate protein-calorie malnutrition: Secondary | ICD-10-CM | POA: Diagnosis present

## 2021-10-24 DIAGNOSIS — Z801 Family history of malignant neoplasm of trachea, bronchus and lung: Secondary | ICD-10-CM | POA: Diagnosis not present

## 2021-10-24 DIAGNOSIS — Z8249 Family history of ischemic heart disease and other diseases of the circulatory system: Secondary | ICD-10-CM | POA: Diagnosis not present

## 2021-10-24 LAB — COMPREHENSIVE METABOLIC PANEL
ALT: 17 U/L (ref 0–44)
AST: 13 U/L — ABNORMAL LOW (ref 15–41)
Albumin: 2.5 g/dL — ABNORMAL LOW (ref 3.5–5.0)
Alkaline Phosphatase: 82 U/L (ref 38–126)
Anion gap: 9 (ref 5–15)
BUN: 9 mg/dL (ref 6–20)
CO2: 22 mmol/L (ref 22–32)
Calcium: 8.1 mg/dL — ABNORMAL LOW (ref 8.9–10.3)
Chloride: 104 mmol/L (ref 98–111)
Creatinine, Ser: 0.45 mg/dL (ref 0.44–1.00)
GFR, Estimated: >60 mL/min (ref >60)
Glucose, Bld: 64 mg/dL — ABNORMAL LOW (ref 70–99)
Potassium: 3.6 mmol/L (ref 3.5–5.1)
Sodium: 135 mmol/L (ref 135–145)
Total Bilirubin: 0.4 mg/dL (ref 0.3–1.2)
Total Protein: 5.2 g/dL — ABNORMAL LOW (ref 6.5–8.1)

## 2021-10-24 LAB — CBC
HCT: 28.2 % — ABNORMAL LOW (ref 36.0–46.0)
Hemoglobin: 9.3 g/dL — ABNORMAL LOW (ref 12.0–15.0)
MCH: 29.3 pg (ref 26.0–34.0)
MCHC: 33 g/dL (ref 30.0–36.0)
MCV: 89 fL (ref 80.0–100.0)
Platelets: 267 10*3/uL (ref 150–400)
RBC: 3.17 MIL/uL — ABNORMAL LOW (ref 3.87–5.11)
RDW: 14.1 % (ref 11.5–15.5)
WBC: 5.7 10*3/uL (ref 4.0–10.5)
nRBC: 0 % (ref 0.0–0.2)

## 2021-10-24 LAB — AMYLASE: Amylase: 24 U/L — ABNORMAL LOW (ref 28–100)

## 2021-10-24 LAB — LIPASE, BLOOD: Lipase: 20 U/L (ref 11–51)

## 2021-10-24 MED ORDER — MORPHINE SULFATE 15 MG PO TABS
15.0000 mg | ORAL_TABLET | ORAL | Status: DC | PRN
  Administered 2021-10-24 – 2021-10-26 (×4): 15 mg via ORAL
  Filled 2021-10-24 (×4): qty 1

## 2021-10-24 MED ORDER — LACTATED RINGERS IV SOLN: INTRAVENOUS | Status: DC

## 2021-10-24 MED ORDER — MORPHINE SULFATE ER 30 MG PO TBCR
30.0000 mg | EXTENDED_RELEASE_TABLET | Freq: Three times a day (TID) | ORAL | Status: DC
  Administered 2021-10-24 – 2021-10-26 (×6): 30 mg via ORAL
  Filled 2021-10-24 (×6): qty 1

## 2021-10-24 NOTE — Hospital Course (Addendum)
ANISA LEANOS is Bradlee Bridgers 41 y.o. female with medical history significant of pulmonary embolism, pancreatic cancer and last received chemotherapy 6 days ago, tobacco abuse, acute pancreatitis she is coming to the emergency department with abdominal pain for the past 2 days, but no nausea or vomiting.  She's been admitted with intractable abdominal pain, currently working diagnosis is pancreatitis, but normal lipase/amylase.  She's gradually improved with IVF, bowel rest, and pain control.  Will discharge 2/12 with plan for outpatient follow up.  See below for additional details

## 2021-10-24 NOTE — Assessment & Plan Note (Addendum)
Unclear at this time, imaging concerning for pancreatitis but normal lipase/amylase Treated as pancreatitis Advance diet as tolerated - tolerating soft diet at this time Analgesia - continue home pain meds, she thought dilaudid was effective, will discharge with Cynthia [name] few doses of PO dilaudid to trial (in place of MSIR).  Continue MS contin.  She should discuss further pain management with oncologist.

## 2021-10-24 NOTE — Progress Notes (Signed)
PROGRESS NOTE    Cynthia [name]  NTI:[id number] DOB: 1981-02-24 DOA: 10/23/2021 PCP: [facility]  Chief Complaint  Patient presents with   Abdominal Pain    RUQ    Brief Narrative:  Cynthia [name] is Cynthia [name] 41 y.o. female with medical history significant of pulmonary embolism, pancreatic cancer and last received chemotherapy 6 days ago, tobacco abuse, acute pancreatitis she is coming to the emergency department with abdominal pain for the past 2 days, but no nausea or vomiting.  She's been admitted with intractable abdominal pain, currently working diagnosis is pancreatitis, but normal lipase/amylase.    See below for additional details    Assessment & Plan:   Principal Problem:   Intractable abdominal pain Active Problems:   Pulmonary emboli (HCC)   Pancreatic cancer (HCC)   Cancer related pain   Hypokalemia   Moderate protein malnutrition (HCC)   Normocytic anemia   Abdominal pain   Assessment and Plan: * Intractable abdominal pain- (present on admission) Unclear at this time, imaging concerning for pancreatitis but normal lipase/amylase Will treat for now as pancreatitis  NPO, IVF, analgesia  Given unclear diagnosis, consider repeat CT scan and GI consult if no improvement Continue PPI Analgesia - on long acting pain meds at home, continue home meds with dilaudid on top   Pulmonary emboli ([place])- (present on admission) She's currently on 80 lovenox daily, which works out for treatment dosing (but apparently per pharmacy, she decreased to once daily dosing on her own) - will discuss with patient  Cancer related pain On MS contin and MSIR May need palliative care c/s if issues with tolerating PO meds/pain control  Pancreatic cancer ([place])- (present on admission) Follows at [place] 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 Currently on chemo with folfirinox, last treatment 2/3  Hypokalemia- (present on admission) improved   DVT  prophylaxis: lovenox Code Status: full Family Communication: none Disposition:   Status is: Inpatient Remains inpatient appropriate because: need for IV pain meds, IVF   Consultants:  none  Procedures:  none  Antimicrobials:  Anti-infectives (From admission, onward)    None       Subjective: Contnuied pain  Objective: Vitals:   10/23/21 2120 10/24/21 0146 10/24/21 0512 10/24/21 1221  BP: 121/64 (!) 115/57 138/72 110/67  Pulse: (!) 56 (!) 57 (!) 58 (!) 54  Resp: 16 18 18 18   Temp: 98.7 F (37.1 C) 98.4 F (36.9 C) 98.9 F (37.2 C) 98.8 F (37.1 C)  TempSrc: Oral Oral Oral Oral  SpO2: 100% 100% 100% 100%  Weight:      Height:        Intake/Output Summary (Last 24 hours) at 10/24/2021 1633 Last data filed at 10/24/2021 1400 Gross per 24 hour  Intake 1521.93 ml  Output 1500 ml  Net 21.93 ml   Filed Weights   10/23/21 1349 10/23/21 1515  Weight: 72.6 kg 52.6 kg    Examination:  General exam: Appears calm , but uncomfortable Respiratory system: unlabored Cardiovascular system: RRR Gastrointestinal system: soft, diffusely tender to palpation Central nervous system: Alert and oriented. No focal neurological deficits. Extremities: no LEE Skin: No rashes, lesions or ulcers Psychiatry: Judgement and insight appear normal. Mood & affect appropriate.     Data Reviewed: I have personally reviewed following labs and imaging studies  CBC: Recent Labs  Lab 10/23/21 0130 10/24/21 0425  WBC 5.3 5.7  HGB 9.2* 9.3*  HCT 27.0* 28.2*  MCV 86.0 89.0  PLT 280 267  Basic Metabolic Panel: Recent Labs  Lab 10/23/21 0130 10/24/21 0425  NA 134* 135  K 3.0* 3.6  CL 105 104  CO2 24 22  GLUCOSE 95 64*  BUN 8 9  CREATININE 0.34* 0.45  CALCIUM 7.6* 8.1*    GFR: Estimated Creatinine Clearance: 79.2 mL/min (by C-G formula based on SCr of 0.45 mg/dL).  Liver Function Tests: Recent Labs  Lab 10/23/21 0130 10/24/21 0425  AST 48* 13*  ALT 20 17   ALKPHOS 91 82  BILITOT 0.3 0.4  PROT 5.3* 5.2*  ALBUMIN 2.8* 2.5*    CBG: No results for input(s): GLUCAP in the last 168 hours.   Recent Results (from the past 240 hour(s))  Resp Panel by RT-PCR (Flu [name]&B, Covid) Nasopharyngeal Swab     Status: None   Collection Time: 10/23/21  8:25 AM   Specimen: Nasopharyngeal Swab; Nasopharyngeal(NP) swabs in vial transport medium  Result Value Ref Range Status   SARS Coronavirus 2 by RT PCR NEGATIVE NEGATIVE Final    Comment: (NOTE) SARS-CoV-2 target nucleic acids are NOT DETECTED.  The SARS-CoV-2 RNA is generally detectable in upper respiratory specimens during the acute phase of infection. The lowest concentration of SARS-CoV-2 viral copies this assay can detect is 138 copies/mL. [name] negative result does not preclude SARS-Cov-2 infection and should not be used as the sole basis for treatment or other patient management decisions. [name] negative result may occur with  improper specimen collection/handling, submission of specimen other than nasopharyngeal swab, presence of viral mutation(s) within the areas targeted by this assay, and inadequate number of viral copies(<138 copies/mL). [name] negative result must be combined with clinical observations, patient history, and epidemiological information. The expected result is Negative.  Fact Sheet for Patients:  EntrepreneurPulse.com.au  Fact Sheet for Healthcare Providers:  IncredibleEmployment.be  This test is no t yet approved or cleared by the [place] FDA and  has been authorized for detection and/or diagnosis of SARS-CoV-2 by FDA under an Emergency Use Authorization (EUA). This EUA will remain  in effect (meaning this test can be used) for the duration of the COVID-19 declaration under Section 564(b)(1) of the Act, 21 U.S.C.section 360bbb-3(b)(1), unless the authorization is terminated  or revoked sooner.       Influenza [name] by PCR NEGATIVE NEGATIVE  Final   Influenza B by PCR NEGATIVE NEGATIVE Final    Comment: (NOTE) The Xpert Xpress SARS-CoV-2/FLU/RSV plus assay is intended as an aid in the diagnosis of influenza from Nasopharyngeal swab specimens and should not be used as [name] sole basis for treatment. Nasal washings and aspirates are unacceptable for Xpert Xpress SARS-CoV-2/FLU/RSV testing.  Fact Sheet for Patients: EntrepreneurPulse.com.au  Fact Sheet for Healthcare Providers: IncredibleEmployment.be  This test is not yet approved or cleared by the [place] FDA and has been authorized for detection and/or diagnosis of SARS-CoV-2 by FDA under an Emergency Use Authorization (EUA). This EUA will remain in effect (meaning this test can be used) for the duration of the COVID-19 declaration under Section 564(b)(1) of the Act, 21 U.S.C. section 360bbb-3(b)(1), unless the authorization is terminated or revoked.  Performed at [facility], [place] [address] [place], [place] [address]          Radiology Studies: CT ABDOMEN PELVIS W CONTRAST  Result Date: 10/23/2021 CLINICAL DATA:  Acute, nonlocalized abdominal pain. Under treatment for pancreas cancer EXAM: CT ABDOMEN AND PELVIS WITH CONTRAST TECHNIQUE: Multidetector CT imaging of the abdomen and pelvis was performed using the standard protocol  following bolus administration of intravenous contrast. RADIATION DOSE REDUCTION: This exam was performed according to the departmental dose-optimization program which includes automated exposure control, adjustment of the mA and/or kV according to patient size and/or use of iterative reconstruction technique. CONTRAST:  1101mL OMNIPAQUE IOHEXOL 300 MG/ML  SOLN COMPARISON:  08/09/2021 FINDINGS: Lower chest: Trace atelectasis at the posterior right costophrenic sulcus. Hepatobiliary: 6 mm low-density in the posterior right liver, not seen previously or mentioned on outside abdominal CT  report 10/02/2021. The size limits differentiation between mass and biliary cyst. Pneumobilia in the setting of metallic biliary stent, suggesting patency Pancreas: Known pancreas carcinoma at the head. More indistinct adjacent fat. No gross change in the size of Debbi Strandberg ill-defined low-density mass at the ventral pancreatic head. Main ductal dilatation which appears somewhat improved. Pancreatic body and tail atrophy. Staging considerations discussed on outside dedicated protocol. Spleen: Unremarkable. Adrenals/Urinary Tract: Negative adrenals. No hydronephrosis or stone. Unremarkable bladder. Stomach/Bowel:  No obstruction. No visible bowel inflammation Vascular/Lymphatic: No acute vascular finding. Generous sized peripancreatic lymph nodes measuring up to 9 mm. Reproductive:No pathologic findings. Other: Small volume ascites in the pelvis with thin peritoneal enhancement. Musculoskeletal: No acute abnormalities. IMPRESSION: 1. More fat stranding around the pancreas, question acute pancreatitis. Unremarkable appearance of the stent with pneumobilia suggesting patency. 2. 6 mm low-density in the right liver, not mentioned on most recent staging scan at [facility] 10/02/2021, recommend oncology follow-up. 3. Small volume ascites in the pelvis with thin peritoneal enhancement. Fluid was noted on outside scan, attention on follow-up. 4. Prominent size of peripancreatic nodes when compared to 08/09/2021 scan. Electronically Signed   By: Jorje Guild M.D.   On: 10/23/2021 07:59        Scheduled Meds:  Chlorhexidine Gluconate Cloth  6 each Topical Daily   enoxaparin  80 mg Subcutaneous Q24H   morphine  30 mg Oral Q8H   pantoprazole (PROTONIX) IV  40 mg Intravenous Daily   Continuous Infusions:  lactated ringers 100 mL/hr at 10/24/21 1150     LOS: 0 days    Time spent: over 30 min    Fayrene Helper, MD [facility]   To contact the attending provider between 7A-7P or the covering provider during  after hours 7P-7A, please log into the web site [email or web address] and access using universal [place] password for that web site. If you do not have the password, please call the hospital operator.  10/24/2021, 4:33 PM

## 2021-10-24 NOTE — Progress Notes (Signed)
Transition of Care [facility]) Screening Note  Patient Details  Name: Cynthia [name] Date of Birth: 17-Jul-1981  Transition of Care [facility]) CM/SW Contact:    Sherie Don, LCSW Phone Number: 10/24/2021, 10:31 AM  Transition of Care Department [facility]) has reviewed patient and no TOC needs have been identified at this time. We will continue to monitor patient advancement through interdisciplinary progression rounds. If new patient transition needs arise, please place a TOC consult.

## 2021-10-24 NOTE — Assessment & Plan Note (Signed)
improved

## 2021-10-24 NOTE — Assessment & Plan Note (Signed)
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

## 2021-10-24 NOTE — Assessment & Plan Note (Addendum)
On MS contin and MSIR She requested PO dilaudid on discharge, sent home with Cynthia [name] few to try in place of MSIR, can follow up with cancer doctor to discuss further

## 2021-10-24 NOTE — Assessment & Plan Note (Addendum)
She's currently on 80 lovenox daily, which works out for treatment dosing (1.5 mg/kg dosing once daily). Instructed her to discuss with oncologist their preferred dosing for her.

## 2021-10-25 DIAGNOSIS — K769 Liver disease, unspecified: Secondary | ICD-10-CM

## 2021-10-25 DIAGNOSIS — R599 Enlarged lymph nodes, unspecified: Secondary | ICD-10-CM

## 2021-10-25 DIAGNOSIS — R188 Other ascites: Secondary | ICD-10-CM

## 2021-10-25 LAB — COMPREHENSIVE METABOLIC PANEL
ALT: 14 U/L (ref 0–44)
AST: 12 U/L — ABNORMAL LOW (ref 15–41)
Albumin: 2.5 g/dL — ABNORMAL LOW (ref 3.5–5.0)
Alkaline Phosphatase: 71 U/L (ref 38–126)
Anion gap: 7 (ref 5–15)
BUN: <5 mg/dL — ABNORMAL LOW (ref 6–20)
CO2: 26 mmol/L (ref 22–32)
Calcium: 8.2 mg/dL — ABNORMAL LOW (ref 8.9–10.3)
Chloride: 101 mmol/L (ref 98–111)
Creatinine, Ser: 0.35 mg/dL — ABNORMAL LOW (ref 0.44–1.00)
GFR, Estimated: >60 mL/min (ref >60)
Glucose, Bld: 88 mg/dL (ref 70–99)
Potassium: 3.2 mmol/L — ABNORMAL LOW (ref 3.5–5.1)
Sodium: 134 mmol/L — ABNORMAL LOW (ref 135–145)
Total Bilirubin: 0.3 mg/dL (ref 0.3–1.2)
Total Protein: 5.1 g/dL — ABNORMAL LOW (ref 6.5–8.1)

## 2021-10-25 LAB — CBC WITH DIFFERENTIAL/PLATELET
Abs Immature Granulocytes: 0.02 10*3/uL (ref 0.00–0.07)
Basophils Absolute: 0 10*3/uL (ref 0.0–0.1)
Basophils Relative: 0 %
Eosinophils Absolute: 0.1 10*3/uL (ref 0.0–0.5)
Eosinophils Relative: 3 %
HCT: 24.2 % — ABNORMAL LOW (ref 36.0–46.0)
Hemoglobin: 8.3 g/dL — ABNORMAL LOW (ref 12.0–15.0)
Immature Granulocytes: 1 %
Lymphocytes Relative: 37 %
Lymphs Abs: 1.5 10*3/uL (ref 0.7–4.0)
MCH: 29.5 pg (ref 26.0–34.0)
MCHC: 34.3 g/dL (ref 30.0–36.0)
MCV: 86.1 fL (ref 80.0–100.0)
Monocytes Absolute: 0.4 10*3/uL (ref 0.1–1.0)
Monocytes Relative: 9 %
Neutro Abs: 2 10*3/uL (ref 1.7–7.7)
Neutrophils Relative %: 50 %
Platelets: 249 10*3/uL (ref 150–400)
RBC: 2.81 MIL/uL — ABNORMAL LOW (ref 3.87–5.11)
RDW: 13.6 % (ref 11.5–15.5)
WBC: 4 10*3/uL (ref 4.0–10.5)
nRBC: 0 % (ref 0.0–0.2)

## 2021-10-25 LAB — PHOSPHORUS: Phosphorus: 3 mg/dL (ref 2.5–4.6)

## 2021-10-25 LAB — PROTIME-INR
INR: 1 (ref 0.8–1.2)
Prothrombin Time: 13.1 seconds (ref 11.4–15.2)

## 2021-10-25 LAB — AMYLASE: Amylase: 28 U/L (ref 28–100)

## 2021-10-25 LAB — MAGNESIUM: Magnesium: 1.6 mg/dL — ABNORMAL LOW (ref 1.7–2.4)

## 2021-10-25 LAB — LIPASE, BLOOD: Lipase: 19 U/L (ref 11–51)

## 2021-10-25 MED ORDER — HYDROMORPHONE HCL 1 MG/ML IJ SOLN
1.0000 mg | INTRAMUSCULAR | Status: DC | PRN
  Administered 2021-10-25 – 2021-10-26 (×2): 1 mg via INTRAVENOUS
  Filled 2021-10-25 (×2): qty 1

## 2021-10-25 MED ORDER — KCL-LACTATED RINGERS 20 MEQ/L IV SOLN
INTRAVENOUS | Status: DC
  Filled 2021-10-25: qty 1000

## 2021-10-25 MED ORDER — MAGNESIUM SULFATE 2 GM/50ML IV SOLN
2.0000 g | Freq: Once | INTRAVENOUS | Status: AC
  Administered 2021-10-25: 2 g via INTRAVENOUS
  Filled 2021-10-25: qty 50

## 2021-10-25 MED ORDER — POTASSIUM CHLORIDE 2 MEQ/ML IV SOLN
INTRAVENOUS | Status: DC
  Filled 2021-10-25 (×3): qty 1000

## 2021-10-25 MED ORDER — SODIUM CHLORIDE 0.9% FLUSH: 10.0000 mL | Freq: Two times a day (BID) | INTRAVENOUS | Status: DC

## 2021-10-25 MED ORDER — POTASSIUM CHLORIDE 10 MEQ/100ML IV SOLN
10.0000 meq | INTRAVENOUS | Status: AC
  Administered 2021-10-25 (×3): 10 meq via INTRAVENOUS
  Filled 2021-10-25 (×2): qty 100

## 2021-10-25 MED ORDER — SODIUM CHLORIDE 0.9% FLUSH: 10.0000 mL | INTRAVENOUS | Status: DC | PRN

## 2021-10-25 NOTE — Assessment & Plan Note (Addendum)
Noted on outside scan, needs attention in follow up Discussed with patient

## 2021-10-25 NOTE — Progress Notes (Signed)
PROGRESS NOTE    Cynthia [name]  DGL:[id number] DOB: [birth year] DOA: 10/23/2021 PCP: [facility]  Chief Complaint  Patient presents with   Abdominal Pain    RUQ    Brief Narrative:  Cynthia [name] is Cynthia [name] 41 y.o. female with medical history significant of pulmonary embolism, pancreatic cancer and last received chemotherapy 6 days ago, tobacco abuse, acute pancreatitis she is coming to the emergency department with abdominal pain for the past 2 days, but no nausea or vomiting.  She's been admitted with intractable abdominal pain, currently working diagnosis is pancreatitis, but normal lipase/amylase.    See below for additional details    Assessment & Plan:   Principal Problem:   Intractable abdominal pain Active Problems:   Pulmonary emboli (HCC)   Pancreatic cancer (HCC)   Cancer related pain   Liver lesion   Ascites   Lymph node enlargement   Hypokalemia   Moderate protein malnutrition (HCC)   Normocytic anemia   Abdominal pain   Assessment and Plan: * Intractable abdominal pain- (present on admission) Unclear at this time, imaging concerning for pancreatitis but normal lipase/amylase Will treat for now as pancreatitis  NPO, IVF, analgesia  Advance diet as tolerated - soft at this time Continue PPI Analgesia - on long acting pain meds at home, continue home meds with dilaudid on top  Given unclear diagnosis, consider repeat CT scan and GI consult if no improvement  Pulmonary emboli ([place])- (present on admission) She's currently on 80 lovenox daily, which works out for treatment dosing (but apparently per pharmacy, she decreased to once daily dosing on her own) - will discuss with patient  Lymph node enlargement Prominent peripancreatic nodes when compared to 07/2021 scan, follow with oncolgoy  Ascites Noted on outside scan, needs attention in follow up  Liver lesion This was not noted on recent staging scan from 1/19, will need to be followed  with [facility] closely  Cancer related pain On MS contin and MSIR May need palliative care c/s if issues with tolerating PO meds/pain control  Pancreatic cancer ([place])- (present on admission) Follows at [place] 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 Currently on chemo with folfirinox, last treatment 2/3  Hypokalemia- (present on admission) improved   DVT prophylaxis: lovenox Code Status: full Family Communication: none Disposition:   Status is: Inpatient Remains inpatient appropriate because: need for IV pain meds, IVF   Consultants:  none  Procedures:  none  Antimicrobials:  Anti-infectives (From admission, onward)    None       Subjective: Pain improved, but she's still using IV dilaudid frequently, discussed importance of transitioning to PO prior to her discharge  Objective: Vitals:   10/24/21 1221 10/24/21 2127 10/25/21 0557 10/25/21 1348  BP: 110/67 127/77 133/75 110/75  Pulse: (!) 54 (!) 57 (!) 56 68  Resp: 18 18 18 18   Temp: 98.8 F (37.1 C) 98.5 F (36.9 C) 98 F (36.7 C)   TempSrc: Oral Oral Oral   SpO2: 100% 100% 100% 99%  Weight:      Height:        Intake/Output Summary (Last 24 hours) at 10/25/2021 1909 Last data filed at 10/25/2021 1800 Gross per 24 hour  Intake 3316.14 ml  Output 1200 ml  Net 2116.14 ml   Filed Weights   10/23/21 1349 10/23/21 1515  Weight: 72.6 kg 52.6 kg    Examination:  General: No acute distress. Cardiovascular: RRR Lungs: unlabored Abdomen: Soft, nontender, nondistended - improved  pain today Neurological: Alert and oriented 3. Moves all extremities 4 with equal strength. Cranial nerves II through XII grossly intact. Skin: Warm and dry. No rashes or lesions. Extremities: No clubbing or cyanosis. No edema.   Data Reviewed: I have personally reviewed following labs and imaging studies  CBC: Recent Labs  Lab 10/23/21 0130 10/24/21 0425 10/25/21 0446  WBC 5.3 5.7 4.0  NEUTROABS  --    --  2.0  HGB 9.2* 9.3* 8.3*  HCT 27.0* 28.2* 24.2*  MCV 86.0 89.0 86.1  PLT 280 267 751    Basic Metabolic Panel: Recent Labs  Lab 10/23/21 0130 10/24/21 0425 10/25/21 0446  NA 134* 135 134*  K 3.0* 3.6 3.2*  CL 105 104 101  CO2 24 22 26   GLUCOSE 95 64* 88  BUN 8 9 <5*  CREATININE 0.34* 0.45 0.35*  CALCIUM 7.6* 8.1* 8.2*  MG  --   --  1.6*  PHOS  --   --  3.0    GFR: Estimated Creatinine Clearance: 79.2 mL/min (Cynthia [name]) (by C-G formula based on SCr of 0.35 mg/dL (L)).  Liver Function Tests: Recent Labs  Lab 10/23/21 0130 10/24/21 0425 10/25/21 0446  AST 48* 13* 12*  ALT 20 17 14   ALKPHOS 91 82 71  BILITOT 0.3 0.4 0.3  PROT 5.3* 5.2* 5.1*  ALBUMIN 2.8* 2.5* 2.5*    CBG: No results for input(s): GLUCAP in the last 168 hours.   Recent Results (from the past 240 hour(s))  Resp Panel by RT-PCR (Flu Cynthia [name]&B, Covid) Nasopharyngeal Swab     Status: None   Collection Time: 10/23/21  8:25 AM   Specimen: Nasopharyngeal Swab; Nasopharyngeal(NP) swabs in vial transport medium  Result Value Ref Range Status   SARS Coronavirus 2 by RT PCR NEGATIVE NEGATIVE Final    Comment: (NOTE) SARS-CoV-2 target nucleic acids are NOT DETECTED.  The SARS-CoV-2 RNA is generally detectable in upper respiratory specimens during the acute phase of infection. The lowest concentration of SARS-CoV-2 viral copies this assay can detect is 138 copies/mL. Cynthia [name] negative result does not preclude SARS-Cov-2 infection and should not be used as the sole basis for treatment or other patient management decisions. Cynthia [name] negative result may occur with  improper specimen collection/handling, submission of specimen other than nasopharyngeal swab, presence of viral mutation(s) within the areas targeted by this assay, and inadequate number of viral copies(<138 copies/mL). Cynthia [name] negative result must be combined with clinical observations, patient history, and epidemiological information. The expected result is  Negative.  Fact Sheet for Patients:  EntrepreneurPulse.com.au  Fact Sheet for Healthcare Providers:  IncredibleEmployment.be  This test is no t yet approved or cleared by the [place] FDA and  has been authorized for detection and/or diagnosis of SARS-CoV-2 by FDA under an Emergency Use Authorization (EUA). This EUA will remain  in effect (meaning this test can be used) for the duration of the COVID-19 declaration under Section 564(b)(1) of the Act, 21 U.S.C.section 360bbb-3(b)(1), unless the authorization is terminated  or revoked sooner.       Influenza Randi Poullard by PCR NEGATIVE NEGATIVE Final   Influenza B by PCR NEGATIVE NEGATIVE Final    Comment: (NOTE) The Xpert Xpress SARS-CoV-2/FLU/RSV plus assay is intended as an aid in the diagnosis of influenza from Nasopharyngeal swab specimens and should not be used as Zury Fazzino sole basis for treatment. Nasal washings and aspirates are unacceptable for Xpert Xpress SARS-CoV-2/FLU/RSV testing.  Fact Sheet for Patients: EntrepreneurPulse.com.au  Fact Sheet for Healthcare Providers: IncredibleEmployment.be  This test is not yet approved or cleared by the [place] and has been authorized for detection and/or diagnosis of SARS-CoV-2 by FDA under an Emergency Use Authorization (EUA). This EUA will remain in effect (meaning this test can be used) for the duration of the COVID-19 declaration under Section 564(b)(1) of the Act, 21 U.S.C. section 360bbb-3(b)(1), unless the authorization is terminated or revoked.  Performed at [facility], [place] [employer] [employer] [address], [place], [place] [address]          Radiology Studies: No results found.      Scheduled Meds:  Chlorhexidine Gluconate Cloth  6 each Topical Daily   enoxaparin  80 mg Subcutaneous Q24H   morphine  30 mg Oral Q8H   pantoprazole (PROTONIX) IV  40 mg Intravenous Daily   sodium  chloride flush  10-40 mL Intracatheter Q12H   Continuous Infusions:  lactated ringers 1,000 mL with potassium chloride 20 mEq infusion 75 mL/hr at 10/25/21 1135     LOS: 1 day    Time spent: over 30 min    Fayrene Helper, MD [facility]   To contact the attending provider between 7A-7P or the covering provider during after hours 7P-7A, please log into the web site [email or web address] and access using universal [place] password for that web site. If you do not have the password, please call the hospital operator.  10/25/2021, 7:09 PM

## 2021-10-25 NOTE — Assessment & Plan Note (Addendum)
Prominent peripancreatic nodes when compared to 07/2021 scan, follow with oncolgoy Discussed with patient

## 2021-10-25 NOTE — Plan of Care (Signed)
°  Problem: Pain Managment: Goal: General experience of comfort will improve Outcome: Not Progressing   Problem: Elimination: Goal: Will not experience complications related to bowel motility Outcome: Not Progressing   Problem: Nutrition: Goal: Adequate nutrition will be maintained Outcome: Not Progressing

## 2021-10-25 NOTE — Assessment & Plan Note (Addendum)
This was not noted on recent staging scan from 1/19, will need to be followed with [facility] closely Discussed with patient

## 2021-10-26 LAB — CBC WITH DIFFERENTIAL/PLATELET
Abs Immature Granulocytes: 0.01 10*3/uL (ref 0.00–0.07)
Basophils Absolute: 0 10*3/uL (ref 0.0–0.1)
Basophils Relative: 0 %
Eosinophils Absolute: 0.1 10*3/uL (ref 0.0–0.5)
Eosinophils Relative: 2 %
HCT: 26.6 % — ABNORMAL LOW (ref 36.0–46.0)
Hemoglobin: 8.8 g/dL — ABNORMAL LOW (ref 12.0–15.0)
Immature Granulocytes: 0 %
Lymphocytes Relative: 37 %
Lymphs Abs: 1.4 10*3/uL (ref 0.7–4.0)
MCH: 28.9 pg (ref 26.0–34.0)
MCHC: 33.1 g/dL (ref 30.0–36.0)
MCV: 87.2 fL (ref 80.0–100.0)
Monocytes Absolute: 0.3 10*3/uL (ref 0.1–1.0)
Monocytes Relative: 7 %
Neutro Abs: 2.1 10*3/uL (ref 1.7–7.7)
Neutrophils Relative %: 54 %
Platelets: 286 10*3/uL (ref 150–400)
RBC: 3.05 MIL/uL — ABNORMAL LOW (ref 3.87–5.11)
RDW: 13.8 % (ref 11.5–15.5)
WBC: 3.9 10*3/uL — ABNORMAL LOW (ref 4.0–10.5)
nRBC: 0 % (ref 0.0–0.2)

## 2021-10-26 LAB — COMPREHENSIVE METABOLIC PANEL
ALT: 14 U/L (ref 0–44)
AST: 15 U/L (ref 15–41)
Albumin: 2.6 g/dL — ABNORMAL LOW (ref 3.5–5.0)
Alkaline Phosphatase: 73 U/L (ref 38–126)
Anion gap: 5 (ref 5–15)
BUN: <5 mg/dL — ABNORMAL LOW (ref 6–20)
CO2: 27 mmol/L (ref 22–32)
Calcium: 8.3 mg/dL — ABNORMAL LOW (ref 8.9–10.3)
Chloride: 102 mmol/L (ref 98–111)
Creatinine, Ser: 0.32 mg/dL — ABNORMAL LOW (ref 0.44–1.00)
GFR, Estimated: >60 mL/min (ref >60)
Glucose, Bld: 111 mg/dL — ABNORMAL HIGH (ref 70–99)
Potassium: 3.8 mmol/L (ref 3.5–5.1)
Sodium: 134 mmol/L — ABNORMAL LOW (ref 135–145)
Total Bilirubin: 0.1 mg/dL — ABNORMAL LOW (ref 0.3–1.2)
Total Protein: 5.2 g/dL — ABNORMAL LOW (ref 6.5–8.1)

## 2021-10-26 LAB — PHOSPHORUS: Phosphorus: 3 mg/dL (ref 2.5–4.6)

## 2021-10-26 LAB — AMYLASE: Amylase: 36 U/L (ref 28–100)

## 2021-10-26 LAB — MAGNESIUM: Magnesium: 1.8 mg/dL (ref 1.7–2.4)

## 2021-10-26 LAB — LIPASE, BLOOD: Lipase: 20 U/L (ref 11–51)

## 2021-10-26 MED ORDER — HYDROMORPHONE HCL 2 MG PO TABS
2.0000 mg | ORAL_TABLET | ORAL | Status: DC | PRN
  Administered 2021-10-26: 2 mg via ORAL
  Filled 2021-10-26: qty 1

## 2021-10-26 MED ORDER — HEPARIN SOD (PORK) LOCK FLUSH 100 UNIT/ML IV SOLN
500.0000 [IU] | INTRAVENOUS | Status: AC | PRN
  Administered 2021-10-26: 500 [IU]
  Filled 2021-10-26: qty 5

## 2021-10-26 MED ORDER — HYDROMORPHONE HCL 2 MG PO TABS: 2.0000 mg | ORAL_TABLET | Freq: Three times a day (TID) | ORAL | 0 refills | Status: AC | PRN

## 2021-10-26 NOTE — Discharge Summary (Signed)
Physician Discharge Summary  Cynthia [name] DOB: 07/12/1981 DOA: 10/23/2021  PCP: [facility]  Admit date: 10/23/2021 Discharge date: 10/26/2021  Time spent: 40 minutes  Recommendations for Outpatient Follow-up:  Follow outpatient CBC/CMP Concern for pancreatitis here, discrepant labs, follow outpatient - consider GI follow up Follow cancer related pain, pain management outpatient Follow liver lesion, ascites, peritoneal enhancement, and peripancreatic LN enlargement with oncology outpatient    Discharge Diagnoses:  Principal Problem:   Intractable abdominal pain Active Problems:   Pulmonary emboli (HCC)   Pancreatic cancer ([place])   Cancer related pain   Liver lesion   Ascites   Lymph node enlargement   Hypokalemia   Moderate protein malnutrition ([place])   Normocytic anemia   Abdominal pain   Discharge Condition: stable  Diet recommendation: heart healthy  Filed Weights   10/23/21 1349 10/23/21 1515  Weight: 72.6 kg 52.6 kg    History of present illness:  Cynthia [name] is Cynthia [name] 41 y.o. female with medical history significant of pulmonary embolism, pancreatic cancer and last received chemotherapy 6 days ago, tobacco abuse, acute pancreatitis she is coming to the emergency department with abdominal pain for the past 2 days, but no nausea or vomiting.  She's been admitted with intractable abdominal pain, currently working diagnosis is pancreatitis, but normal lipase/amylase.  She's gradually improved with IVF, bowel rest, and pain control.  Will discharge 2/12 with plan for outpatient follow up.  See below for additional details  Hospital Course:  Assessment and Plan: * Intractable abdominal pain- (present on admission) Unclear at this time, imaging concerning for pancreatitis but normal lipase/amylase Treated as pancreatitis Advance diet as tolerated - tolerating soft diet at this time Analgesia - continue home pain meds, she thought dilaudid  was effective, will discharge with Cynthia [name] few doses of PO dilaudid to trial (in place of MSIR).  Continue MS contin.  She should discuss further pain management with oncologist.  Pulmonary emboli [facility])- (present on admission) She's currently on 80 lovenox daily, which works out for treatment dosing (1.5 mg/kg dosing once daily). Instructed her to discuss with oncologist their preferred dosing for her.  Lymph node enlargement Prominent peripancreatic nodes when compared to 07/2021 scan, follow with on[facility] Discussed with patient  Ascites Noted on outside scan, needs attention in follow up Discussed with patient  Liver lesion This was not noted on recent staging scan from 1/19, will need to be followed with [facility] closely Discussed with patient  Cancer related pain On MS contin and MSIR She requested PO dilaudid on discharge, sent home with Cynthia [name] few to try in place of MSIR, can follow up with cancer doctor to discuss further  Pancreatic cancer ([place])- (present on admission) Follows at [facility] 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 Cardiovascular: RRR Lungs: unlabored Abdomen: Soft, nontender, nondistended  Neurological: Alert and oriented 3. Moves all extremities 4 . Cranial nerves II through XII grossly intact. Skin: Warm and dry. No rashes or lesions. Extremities: No clubbing or cyanosis. No edema  Discharge Instructions  Discharge Instructions     Call MD for:  difficulty breathing, headache or visual disturbances   Complete by: As directed    Call  MD for:  extreme fatigue   Complete by: As directed    Call MD for:  hives   Complete by: As directed    Call MD for:  persistant dizziness or light-headedness   Complete by: As directed    Call MD for:  persistant nausea and vomiting   Complete by: As directed    Call MD for:  redness, tenderness, or signs of infection (pain, swelling, redness, odor or green/yellow discharge around incision site)   Complete by: As directed    Call MD for:  severe uncontrolled pain   Complete by: As directed    Call MD for:  temperature >100.4   Complete by: As directed    Diet - low sodium heart healthy   Complete by: As directed    Discharge instructions   Complete by: As directed    You were seen for abdominal pain.  Your imaging findings were concerning for pancreatitis, but the labs did not exactly support that.  You've improved with supportive care.  Please follow up with your oncologist and review this hospitalization with them.  Continue your MS contin as scheduled.  Continue your MSIR as needed for pain.  Per your request, I'll send you with Cynthia [name] few pills of dilaudid which you can try in place of the MSIR to see if this helps more (don't take the dilaudid and MSIR at the same time, wait at least 4 hours between these doses).  You can discuss with your PCP or oncologist whether the dilaudid is more helpful than the MSIR and whether you'd want to change.  You had abnormal imaging findings which should be followed up with your oncologist.  There was Cynthia [name] lesion seen in the liver as well as abdominal fluid noted which should be discussed with your oncologist.  The peripancreatic nodes were prominent as well.  Return for new, recurrent, or worsening symptoms.  Please ask your PCP to request records from this hospitalization so they know what was done and what the next steps will be.   Increase activity slowly   Complete by: As directed       Allergies as of 10/26/2021   No Known Allergies       Medication List     TAKE these medications    Creon 24000-76000 units Cpep Generic drug: Pancrelipase (Lip-Prot-Amyl) Take 1 capsule by mouth daily.   dexamethasone 4 MG tablet Commonly known as: DECADRON Take 8mg  (2 x 4mg  tablets) by mouth in the morning for 2 days after the first day of each cycle, then as directed.   diazepam 5 MG tablet Commonly known as: Valium Take 1 tablet (5 mg total) by mouth every 8 (eight) hours as needed for muscle spasms.   dicyclomine 20 MG tablet Commonly known as: BENTYL Take 1 tablet (20 mg total) by mouth 2 (two) times daily as needed for up to 7 days for spasms.   docusate sodium 100 MG capsule Commonly known as: COLACE Take 1 capsule (100 mg total) by mouth 2 (two) times daily. What changed:  when to take this reasons to take this   enoxaparin 80 MG/0.8ML injection Commonly known as: LOVENOX Inject 80 mg into the skin daily.   HYDROmorphone 2 MG tablet Commonly known as: Dilaudid Take 1 tablet (2 mg total) by mouth every 8 (eight) hours  as needed for up to 3 days for severe pain. (Try in place of MSIR for break through pain, you can discuss with your PCP/oncologist your chronic pain management)   ibuprofen 200 MG tablet Commonly known as: ADVIL Take 400 mg by mouth every 6 (six) hours as needed for mild pain.   lidocaine-prilocaine cream Commonly known as: EMLA Apply 1 application topically as needed (for port access).   morphine 30 MG 12 hr tablet Commonly known as: MS CONTIN Take 30 mg by mouth every 8 (eight) hours as needed for pain. Start taking on: October 30, 2021   morphine 15 MG tablet Commonly known as: MSIR Take 15 mg by mouth every 4 (four) hours as needed for pain. Start taking on: October 30, 2021   ondansetron 8 MG disintegrating tablet Commonly known as: ZOFRAN-ODT Take 8 mg by mouth every 8 (eight) hours as needed for nausea.   prochlorperazine 10 MG tablet Commonly known as: COMPAZINE Take 10 mg by  mouth every 6 (six) hours as needed for nausea.   sucralfate 1 g tablet Commonly known as: Carafate Take 1 tablet (1 g total) by mouth 4 (four) times daily -  with meals and at bedtime for 14 days.       No Known Allergies    The results of significant diagnostics from this hospitalization (including imaging, microbiology, ancillary and laboratory) are listed below for reference.    Significant Diagnostic Studies: CT ABDOMEN PELVIS W CONTRAST  Result Date: 10/23/2021 CLINICAL DATA:  Acute, nonlocalized abdominal pain. Under treatment for pancreas cancer EXAM: CT ABDOMEN AND PELVIS WITH CONTRAST TECHNIQUE: Multidetector CT imaging of the abdomen and pelvis was performed using the standard protocol following bolus administration of intravenous contrast. RADIATION DOSE REDUCTION: This exam was performed according to the departmental dose-optimization program which includes automated exposure control, adjustment of the mA and/or kV according to patient size and/or use of iterative reconstruction technique. CONTRAST:  134mL OMNIPAQUE IOHEXOL 300 MG/ML  SOLN COMPARISON:  08/09/2021 FINDINGS: Lower chest: Trace atelectasis at the posterior right costophrenic sulcus. Hepatobiliary: 6 mm low-density in the posterior right liver, not seen previously or mentioned on outside abdominal CT report 10/02/2021. The size limits differentiation between mass and biliary cyst. Pneumobilia in the setting of metallic biliary stent, suggesting patency Pancreas: Known pancreas carcinoma at the head. More indistinct adjacent fat. No gross change in the size of Cynthia [name] ill-defined low-density mass at the ventral pancreatic head. Main ductal dilatation which appears somewhat improved. Pancreatic body and tail atrophy. Staging considerations discussed on outside dedicated protocol. Spleen: Unremarkable. Adrenals/Urinary Tract: Negative adrenals. No hydronephrosis or stone. Unremarkable bladder. Stomach/Bowel:  No obstruction. No  visible bowel inflammation Vascular/Lymphatic: No acute vascular finding. Generous sized peripancreatic lymph nodes measuring up to 9 mm. Reproductive:No pathologic findings. Other: Small volume ascites in the pelvis with thin peritoneal enhancement. Musculoskeletal: No acute abnormalities. IMPRESSION: 1. More fat stranding around the pancreas, question acute pancreatitis. Unremarkable appearance of the stent with pneumobilia suggesting patency. 2. 6 mm low-density in the right liver, not mentioned on most recent staging scan at [facility] 10/02/2021, recommend oncology follow-up. 3. Small volume ascites in the pelvis with thin peritoneal enhancement. Fluid was noted on outside scan, attention on follow-up. 4. Prominent size of peripancreatic nodes when compared to 08/09/2021 scan. Electronically Signed   By: Jorje Guild M.D.   On: 10/23/2021 07:59    Microbiology: Recent Results (from the past 240 hour(s))  Resp Panel by RT-PCR (Flu Cynthia [name]&B, Covid) Nasopharyngeal Swab  Status: None   Collection Time: 10/23/21  8:25 AM   Specimen: Nasopharyngeal Swab; Nasopharyngeal(NP) swabs in vial transport medium  Result Value Ref Range Status   SARS Coronavirus 2 by RT PCR NEGATIVE NEGATIVE Final    Comment: (NOTE) SARS-CoV-2 target nucleic acids are NOT DETECTED.  The SARS-CoV-2 RNA is generally detectable in upper respiratory specimens during the acute phase of infection. The lowest concentration of SARS-CoV-2 viral copies this assay can detect is 138 copies/mL. Cynthia [name] negative result does not preclude SARS-Cov-2 infection and should not be used as the sole basis for treatment or other patient management decisions. Cynthia [name] negative result may occur with  improper specimen collection/handling, submission of specimen other than nasopharyngeal swab, presence of viral mutation(s) within the areas targeted by this assay, and inadequate number of viral copies(<138 copies/mL). Cynthia [name] negative result must be combined with clinical  observations, patient history, and epidemiological information. The expected result is Negative.  Fact Sheet for Patients:  EntrepreneurPulse.com.au  Fact Sheet for Healthcare Providers:  IncredibleEmployment.be  This test is no t yet approved or cleared by the [place] FDA and  has been authorized for detection and/or diagnosis of SARS-CoV-2 by FDA under an Emergency Use Authorization (EUA). This EUA will remain  in effect (meaning this test can be used) for the duration of the COVID-19 declaration under Section 564(b)(1) of the Act, 21 U.S.C.section 360bbb-3(b)(1), unless the authorization is terminated  or revoked sooner.       Influenza Cynthia [name] by PCR NEGATIVE NEGATIVE Final   Influenza B by PCR NEGATIVE NEGATIVE Final    Comment: (NOTE) The Xpert Xpress SARS-CoV-2/FLU/RSV plus assay is intended as an aid in the diagnosis of influenza from Nasopharyngeal swab specimens and should not be used as Cynthia Colasanti sole basis for treatment. Nasal washings and aspirates are unacceptable for Xpert Xpress SARS-CoV-2/FLU/RSV testing.  Fact Sheet for Patients: EntrepreneurPulse.com.au  Fact Sheet for Healthcare Providers: IncredibleEmployment.be  This test is not yet approved or cleared by the [place] FDA and has been authorized for detection and/or diagnosis of SARS-CoV-2 by FDA under an Emergency Use Authorization (EUA). This EUA will remain in effect (meaning this test can be used) for the duration of the COVID-19 declaration under Section 564(b)(1) of the Act, 21 U.S.C. section 360bbb-3(b)(1), unless the authorization is terminated or revoked.  Performed at [facility], [place] [address], [place], [place] [address]      Labs: Basic Metabolic Panel: Recent Labs  Lab 10/23/21 0130 10/24/21 0425 10/25/21 0446 10/26/21 0503  NA 134* 135 134* 134*  K 3.0* 3.6 3.2* 3.8  CL 105 104 101 102   CO2 24 22 26 27   GLUCOSE 95 64* 88 111*  BUN 8 9 <5* <5*  CREATININE 0.34* 0.45 0.35* 0.32*  CALCIUM 7.6* 8.1* 8.2* 8.3*  MG  --   --  1.6* 1.8  PHOS  --   --  3.0 3.0   Liver Function Tests: Recent Labs  Lab 10/23/21 0130 10/24/21 0425 10/25/21 0446 10/26/21 0503  AST 48* 13* 12* 15  ALT 20 17 14 14   ALKPHOS 91 82 71 73  BILITOT 0.3 0.4 0.3 0.1*  PROT 5.3* 5.2* 5.1* 5.2*  ALBUMIN 2.8* 2.5* 2.5* 2.6*   Recent Labs  Lab 10/23/21 0130 10/24/21 0425 10/24/21 1054 10/25/21 0446 10/26/21 0503  LIPASE 23 20  --  19 20  AMYLASE  --   --  24* 28 36   No results for input(s): AMMONIA in the last  168 hours. CBC: Recent Labs  Lab 10/23/21 0130 10/24/21 0425 10/25/21 0446 10/26/21 0503  WBC 5.3 5.7 4.0 3.9*  NEUTROABS  --   --  2.0 2.1  HGB 9.2* 9.3* 8.3* 8.8*  HCT 27.0* 28.2* 24.2* 26.6*  MCV 86.0 89.0 86.1 87.2  PLT 280 267 249 286   Cardiac Enzymes: No results for input(s): CKTOTAL, CKMB, CKMBINDEX, TROPONINI in the last 168 hours. BNP: BNP (last 3 results) No results for input(s): BNP in the last 8760 hours.  ProBNP (last 3 results) No results for input(s): PROBNP in the last 8760 hours.  CBG: No results for input(s): GLUCAP in the last 168 hours.     Signed:  Fayrene Helper MD.  [facility] 10/26/2021, 12:44 PM

## 2021-10-26 NOTE — Progress Notes (Signed)
Patient was given discharge orders, and all questions were answered. Patient was stable for discharge and was taken to the main exit by wheelchair.

## 2021-11-17 ENCOUNTER — Encounter (HOSPITAL_COMMUNITY): Payer: Self-pay

## 2021-11-17 ENCOUNTER — Other Ambulatory Visit: Payer: Self-pay

## 2021-11-17 ENCOUNTER — Emergency Department (HOSPITAL_COMMUNITY)
Admission: EM | Admit: 2021-11-17 | Discharge: 2021-11-18 | Disposition: A | Discharge status: Home / Self Care | Payer: Medicaid Other | Attending: Emergency Medicine | Admitting: Emergency Medicine

## 2021-11-17 DIAGNOSIS — R1011 Right upper quadrant pain: Secondary | ICD-10-CM | POA: Diagnosis present

## 2021-11-17 DIAGNOSIS — F17210 Nicotine dependence, cigarettes, uncomplicated: Secondary | ICD-10-CM | POA: Insufficient documentation

## 2021-11-17 DIAGNOSIS — C259 Malignant neoplasm of pancreas, unspecified: Secondary | ICD-10-CM | POA: Diagnosis not present

## 2021-11-17 DIAGNOSIS — D72829 Elevated white blood cell count, unspecified: Secondary | ICD-10-CM | POA: Diagnosis not present

## 2021-11-17 DIAGNOSIS — C787 Secondary malignant neoplasm of liver and intrahepatic bile duct: Secondary | ICD-10-CM | POA: Diagnosis not present

## 2021-11-17 DIAGNOSIS — C801 Malignant (primary) neoplasm, unspecified: Secondary | ICD-10-CM

## 2021-11-17 DIAGNOSIS — K831 Obstruction of bile duct: Secondary | ICD-10-CM | POA: Insufficient documentation

## 2021-11-17 NOTE — ED Triage Notes (Addendum)
Pt reports with abdominal pain that wraps around to her back x 2 days. Pt reports having pancreatic cancer and states that she is to have an ERCP in the morning at [facility].  ?

## 2021-11-18 ENCOUNTER — Encounter (HOSPITAL_COMMUNITY): Payer: Self-pay

## 2021-11-18 ENCOUNTER — Emergency Department (HOSPITAL_COMMUNITY): Payer: Medicaid Other

## 2021-11-18 ENCOUNTER — Other Ambulatory Visit: Payer: Self-pay

## 2021-11-18 ENCOUNTER — Inpatient Hospital Stay (HOSPITAL_COMMUNITY)
Admission: EM | Admit: 2021-11-18 | Discharge: 2021-11-26 | DRG: 435 | Disposition: A | Discharge status: Home Health | Payer: Medicaid Other | Attending: Internal Medicine | Admitting: Internal Medicine

## 2021-11-18 DIAGNOSIS — K831 Obstruction of bile duct: Secondary | ICD-10-CM | POA: Diagnosis present

## 2021-11-18 DIAGNOSIS — R109 Unspecified abdominal pain: Secondary | ICD-10-CM | POA: Diagnosis present

## 2021-11-18 DIAGNOSIS — Z86711 Personal history of pulmonary embolism: Secondary | ICD-10-CM

## 2021-11-18 DIAGNOSIS — K828 Other specified diseases of gallbladder: Secondary | ICD-10-CM | POA: Diagnosis present

## 2021-11-18 DIAGNOSIS — R7989 Other specified abnormal findings of blood chemistry: Secondary | ICD-10-CM

## 2021-11-18 DIAGNOSIS — R17 Unspecified jaundice: Secondary | ICD-10-CM

## 2021-11-18 DIAGNOSIS — E871 Hypo-osmolality and hyponatremia: Secondary | ICD-10-CM | POA: Diagnosis present

## 2021-11-18 DIAGNOSIS — E43 Unspecified severe protein-calorie malnutrition: Secondary | ICD-10-CM | POA: Diagnosis present

## 2021-11-18 DIAGNOSIS — Z515 Encounter for palliative care: Secondary | ICD-10-CM

## 2021-11-18 DIAGNOSIS — Z8673 Personal history of transient ischemic attack (TIA), and cerebral infarction without residual deficits: Secondary | ICD-10-CM

## 2021-11-18 DIAGNOSIS — G8929 Other chronic pain: Secondary | ICD-10-CM | POA: Diagnosis present

## 2021-11-18 DIAGNOSIS — K819 Cholecystitis, unspecified: Secondary | ICD-10-CM | POA: Diagnosis present

## 2021-11-18 DIAGNOSIS — Z681 Body mass index (BMI) 19 or less, adult: Secondary | ICD-10-CM

## 2021-11-18 DIAGNOSIS — E559 Vitamin D deficiency, unspecified: Secondary | ICD-10-CM | POA: Diagnosis present

## 2021-11-18 DIAGNOSIS — F17210 Nicotine dependence, cigarettes, uncomplicated: Secondary | ICD-10-CM | POA: Diagnosis present

## 2021-11-18 DIAGNOSIS — R1013 Epigastric pain: Secondary | ICD-10-CM

## 2021-11-18 DIAGNOSIS — Z8507 Personal history of malignant neoplasm of pancreas: Secondary | ICD-10-CM

## 2021-11-18 DIAGNOSIS — R1084 Generalized abdominal pain: Principal | ICD-10-CM

## 2021-11-18 DIAGNOSIS — Z801 Family history of malignant neoplasm of trachea, bronchus and lung: Secondary | ICD-10-CM

## 2021-11-18 DIAGNOSIS — E876 Hypokalemia: Secondary | ICD-10-CM | POA: Diagnosis present

## 2021-11-18 DIAGNOSIS — Z79899 Other long term (current) drug therapy: Secondary | ICD-10-CM

## 2021-11-18 DIAGNOSIS — G893 Neoplasm related pain (acute) (chronic): Secondary | ICD-10-CM | POA: Diagnosis present

## 2021-11-18 DIAGNOSIS — C259 Malignant neoplasm of pancreas, unspecified: Principal | ICD-10-CM

## 2021-11-18 DIAGNOSIS — R188 Other ascites: Secondary | ICD-10-CM | POA: Diagnosis present

## 2021-11-18 DIAGNOSIS — K769 Liver disease, unspecified: Secondary | ICD-10-CM | POA: Diagnosis present

## 2021-11-18 DIAGNOSIS — Z539 Procedure and treatment not carried out, unspecified reason: Secondary | ICD-10-CM | POA: Diagnosis present

## 2021-11-18 LAB — CBC WITH DIFFERENTIAL/PLATELET
Abs Immature Granulocytes: 0.07 10*3/uL (ref 0.00–0.07)
Abs Immature Granulocytes: 0.08 10*3/uL — ABNORMAL HIGH (ref 0.00–0.07)
Basophils Absolute: 0 10*3/uL (ref 0.0–0.1)
Basophils Absolute: 0 10*3/uL (ref 0.0–0.1)
Basophils Relative: 0 %
Basophils Relative: 0 %
Eosinophils Absolute: 0 10*3/uL (ref 0.0–0.5)
Eosinophils Absolute: 0 10*3/uL (ref 0.0–0.5)
Eosinophils Relative: 0 %
Eosinophils Relative: 0 %
HCT: 26.4 % — ABNORMAL LOW (ref 36.0–46.0)
HCT: 29.2 % — ABNORMAL LOW (ref 36.0–46.0)
Hemoglobin: 9.6 g/dL — ABNORMAL LOW (ref 12.0–15.0)
Hemoglobin: 10.2 g/dL — ABNORMAL LOW (ref 12.0–15.0)
Immature Granulocytes: 1 %
Immature Granulocytes: 1 %
Lymphocytes Relative: 8 %
Lymphocytes Relative: 10 %
Lymphs Abs: 1 10*3/uL (ref 0.7–4.0)
Lymphs Abs: 1.2 10*3/uL (ref 0.7–4.0)
MCH: 29 pg (ref 26.0–34.0)
MCH: 28.3 pg (ref 26.0–34.0)
MCHC: 36.4 g/dL — ABNORMAL HIGH (ref 30.0–36.0)
MCHC: 34.9 g/dL (ref 30.0–36.0)
MCV: 79.8 fL — ABNORMAL LOW (ref 80.0–100.0)
MCV: 80.9 fL (ref 80.0–100.0)
Monocytes Absolute: 0.8 10*3/uL (ref 0.1–1.0)
Monocytes Absolute: 0.5 10*3/uL (ref 0.1–1.0)
Monocytes Relative: 7 %
Monocytes Relative: 5 %
Neutro Abs: 9.8 10*3/uL — ABNORMAL HIGH (ref 1.7–7.7)
Neutro Abs: 9.4 10*3/uL — ABNORMAL HIGH (ref 1.7–7.7)
Neutrophils Relative %: 84 %
Neutrophils Relative %: 84 %
Platelets: 361 10*3/uL (ref 150–400)
Platelets: 416 10*3/uL — ABNORMAL HIGH (ref 150–400)
RBC: 3.31 MIL/uL — ABNORMAL LOW (ref 3.87–5.11)
RBC: 3.61 MIL/uL — ABNORMAL LOW (ref 3.87–5.11)
RDW: 13.4 % (ref 11.5–15.5)
RDW: 13.6 % (ref 11.5–15.5)
WBC: 11.8 10*3/uL — ABNORMAL HIGH (ref 4.0–10.5)
WBC: 11.2 10*3/uL — ABNORMAL HIGH (ref 4.0–10.5)
nRBC: 0 % (ref 0.0–0.2)
nRBC: 0 % (ref 0.0–0.2)

## 2021-11-18 LAB — COMPREHENSIVE METABOLIC PANEL
ALT: 87 U/L — ABNORMAL HIGH (ref 0–44)
ALT: 89 U/L — ABNORMAL HIGH (ref 0–44)
AST: 99 U/L — ABNORMAL HIGH (ref 15–41)
AST: 104 U/L — ABNORMAL HIGH (ref 15–41)
Albumin: 2.3 g/dL — ABNORMAL LOW (ref 3.5–5.0)
Albumin: 2.5 g/dL — ABNORMAL LOW (ref 3.5–5.0)
Alkaline Phosphatase: 880 U/L — ABNORMAL HIGH (ref 38–126)
Alkaline Phosphatase: 980 U/L — ABNORMAL HIGH (ref 38–126)
Anion gap: 5 (ref 5–15)
Anion gap: 5 (ref 5–15)
BUN: 5 mg/dL — ABNORMAL LOW (ref 6–20)
BUN: 5 mg/dL — ABNORMAL LOW (ref 6–20)
CO2: 26 mmol/L (ref 22–32)
CO2: 28 mmol/L (ref 22–32)
Calcium: 7.9 mg/dL — ABNORMAL LOW (ref 8.9–10.3)
Calcium: 8.2 mg/dL — ABNORMAL LOW (ref 8.9–10.3)
Chloride: 102 mmol/L (ref 98–111)
Chloride: 98 mmol/L (ref 98–111)
Creatinine, Ser: <0.3 mg/dL — ABNORMAL LOW (ref 0.44–1.00)
Creatinine, Ser: 0.33 mg/dL — ABNORMAL LOW (ref 0.44–1.00)
GFR, Estimated: >60 mL/min (ref >60)
Glucose, Bld: 97 mg/dL (ref 70–99)
Glucose, Bld: 102 mg/dL — ABNORMAL HIGH (ref 70–99)
Potassium: 3.2 mmol/L — ABNORMAL LOW (ref 3.5–5.1)
Potassium: 3.4 mmol/L — ABNORMAL LOW (ref 3.5–5.1)
Sodium: 133 mmol/L — ABNORMAL LOW (ref 135–145)
Sodium: 131 mmol/L — ABNORMAL LOW (ref 135–145)
Total Bilirubin: 4 mg/dL — ABNORMAL HIGH (ref 0.3–1.2)
Total Bilirubin: 3.9 mg/dL — ABNORMAL HIGH (ref 0.3–1.2)
Total Protein: 5.6 g/dL — ABNORMAL LOW (ref 6.5–8.1)
Total Protein: 6 g/dL — ABNORMAL LOW (ref 6.5–8.1)

## 2021-11-18 LAB — MAGNESIUM: Magnesium: 1.6 mg/dL — ABNORMAL LOW (ref 1.7–2.4)

## 2021-11-18 LAB — URINALYSIS, ROUTINE W REFLEX MICROSCOPIC
Bacteria, UA: NONE SEEN
Bilirubin Urine: NEGATIVE
Glucose, UA: NEGATIVE mg/dL
Ketones, ur: 20 mg/dL — AB
Leukocytes,Ua: NEGATIVE
Nitrite: NEGATIVE
Protein, ur: 30 mg/dL — AB
Specific Gravity, Urine: 1.012 (ref 1.005–1.030)
pH: 6 (ref 5.0–8.0)

## 2021-11-18 LAB — LIPASE, BLOOD
Lipase: 17 U/L (ref 11–51)
Lipase: 16 U/L (ref 11–51)

## 2021-11-18 LAB — PREGNANCY, URINE: Preg Test, Ur: NEGATIVE

## 2021-11-18 MED ORDER — SODIUM CHLORIDE 0.9 % IV SOLN: INTRAVENOUS | Status: DC

## 2021-11-18 MED ORDER — HYDROMORPHONE HCL 1 MG/ML IJ SOLN
1.0000 mg | INTRAMUSCULAR | Status: DC | PRN
Start: 2021-11-18 — End: 2021-11-19
  Administered 2021-11-18: 1 mg via INTRAVENOUS
  Filled 2021-11-18 (×2): qty 1

## 2021-11-18 MED ORDER — SODIUM CHLORIDE 0.9 % IV BOLUS
1000.0000 mL | Freq: Once | INTRAVENOUS | Status: AC
  Administered 2021-11-18: 1000 mL via INTRAVENOUS

## 2021-11-18 MED ORDER — POTASSIUM CHLORIDE 10 MEQ/100ML IV SOLN
10.0000 meq | INTRAVENOUS | Status: AC
  Administered 2021-11-19 (×2): 10 meq via INTRAVENOUS
  Filled 2021-11-18 (×2): qty 100

## 2021-11-18 MED ORDER — HYDROMORPHONE HCL 1 MG/ML IJ SOLN
0.5000 mg | Freq: Once | INTRAMUSCULAR | Status: AC
  Administered 2021-11-18: 0.5 mg via INTRAVENOUS
  Filled 2021-11-18: qty 1

## 2021-11-18 MED ORDER — PIPERACILLIN-TAZOBACTAM 3.375 G IVPB 30 MIN
3.3750 g | Freq: Once | INTRAVENOUS | Status: AC
  Administered 2021-11-18: 3.375 g via INTRAVENOUS
  Filled 2021-11-18: qty 50

## 2021-11-18 MED ORDER — HYDROMORPHONE HCL 1 MG/ML IJ SOLN
1.0000 mg | Freq: Once | INTRAMUSCULAR | Status: AC
  Administered 2021-11-18: 1 mg via INTRAVENOUS
  Filled 2021-11-18: qty 1

## 2021-11-18 MED ORDER — ONDANSETRON HCL 4 MG/2ML IJ SOLN
4.0000 mg | Freq: Once | INTRAMUSCULAR | Status: AC
  Administered 2021-11-18: 4 mg via INTRAVENOUS
  Filled 2021-11-18: qty 2

## 2021-11-18 MED ORDER — KETAMINE HCL 50 MG/5ML IJ SOSY
0.3000 mg/kg | PREFILLED_SYRINGE | Freq: Once | INTRAMUSCULAR | Status: AC
  Administered 2021-11-18: 16 mg via INTRAVENOUS
  Filled 2021-11-18: qty 5

## 2021-11-18 MED ORDER — LACTATED RINGERS IV BOLUS
2000.0000 mL | Freq: Once | INTRAVENOUS | Status: AC
  Administered 2021-11-18: 2000 mL via INTRAVENOUS

## 2021-11-18 MED ORDER — PIPERACILLIN-TAZOBACTAM 3.375 G IVPB
3.3750 g | Freq: Three times a day (TID) | INTRAVENOUS | Status: DC
Start: 2021-11-19 — End: 2021-11-22
  Administered 2021-11-19 – 2021-11-22 (×10): 3.375 g via INTRAVENOUS
  Filled 2021-11-18 (×10): qty 50

## 2021-11-18 MED ORDER — IOHEXOL 300 MG/ML  SOLN
80.0000 mL | Freq: Once | INTRAMUSCULAR | Status: AC | PRN
  Administered 2021-11-18: 80 mL via INTRAVENOUS

## 2021-11-18 NOTE — ED Provider Notes (Signed)
[facility] DEPT Provider Note   CSN: [id number] Arrival date & time: 11/18/21  1409     History  Chief Complaint  Patient presents with   Abdominal Pain   Back Pain    Cynthia [name] is a 41 y.o. female.  41 year old female presents with worsening/ongoing abdominal and back pain. History of pancreatic cancer with biliary stent placed 1 year ago, seen here early this morning with pain, evaluated and discharged with plan to go to [facility] for her ERCP today. Patient states this test was done and returns with ongoing pain, not controlled with her home pain meds. Denies fevers, chills, changes in bowel or bladder habits. Reports nausea without vomiting.       Home Medications Prior to Admission medications   Medication Sig Start Date End Date Taking? Authorizing Provider  dicyclomine (BENTYL) 20 MG tablet Take 20 mg by mouth 2 (two) times daily as needed for spasms.   Yes [provider]  enoxaparin (LOVENOX) 80 MG/0.8ML injection Inject 80 mg into the skin daily. 05/28/21  Yes [provider]  HYDROmorphone (DILAUDID) 4 MG tablet Take 2-4 mg by mouth every 4 (four) hours as needed for moderate pain or severe pain. 10/28/21  Yes [provider]  ibuprofen (ADVIL) 200 MG tablet Take 400 mg by mouth every 6 (six) hours as needed for mild pain.   Yes [provider]  morphine (MS CONTIN) 30 MG 12 hr tablet Take 30 mg by mouth every 8 (eight) hours as needed for pain. 10/30/21 12/25/21 Yes [provider]  ondansetron (ZOFRAN-ODT) 8 MG disintegrating tablet Take 8 mg by mouth every 8 (eight) hours as needed for nausea. 07/16/21  Yes [provider]  prochlorperazine (COMPAZINE) 10 MG tablet Take 10 mg by mouth every 6 (six) hours as needed for nausea. 07/17/21  Yes [provider]  diazepam (VALIUM) 5 MG tablet Take 1 tablet (5 mg total) by mouth every 8 (eight) hours as needed for muscle spasms. Patient  not taking: Reported on 11/18/2021 02/13/21 02/13/22  Fransico Meadow, PA-C  docusate sodium (COLACE) 100 MG capsule Take 1 capsule (100 mg total) by mouth 2 (two) times daily. Patient not taking: Reported on 11/18/2021 12/05/20   Lavina Hamman, MD  lidocaine-prilocaine (EMLA) cream Apply 1 application topically as needed (for port access). 05/23/21   [provider]      Allergies    Patient has no known allergies.    Review of Systems   Review of Systems Negative except as per HPI Physical Exam Updated Vital Signs BP (!) 148/75    Pulse (!) 57    Temp 98.4 F (36.9 C) (Oral)    Resp 19    Ht '5\' 5"'  (1.651 m)    Wt 52.6 kg    LMP  (LMP Unknown)    SpO2 100%    BMI 19.30 kg/m  Physical Exam Vitals and nursing note reviewed.  Constitutional:      General: She is not in acute distress.    Appearance: She is well-developed. She is not diaphoretic.     Comments: Rolling in the bed in pain  HENT:     Head: Normocephalic and atraumatic.  Cardiovascular:     Rate and Rhythm: Normal rate and regular rhythm.     Heart sounds: Normal heart sounds.  Pulmonary:     Effort: Pulmonary effort is normal.     Breath sounds: Normal breath sounds.  Abdominal:     General: Bowel sounds are normal.     Tenderness: There is abdominal tenderness in the right upper quadrant and epigastric area. There is right CVA tenderness and left CVA tenderness.  Skin:    General: Skin is warm and dry.     Findings: No erythema or rash.  Neurological:     Mental Status: She is alert and oriented to person, place, and time.  Psychiatric:        Behavior: Behavior normal.    ED Results / Procedures / Treatments   Labs (all labs ordered are listed, but only abnormal results are displayed) Labs Reviewed  CBC WITH DIFFERENTIAL/PLATELET - Abnormal; Notable for the following components:      Result Value   WBC 11.2 (*)    RBC 3.61 (*)    Hemoglobin 10.2 (*)    HCT 29.2 (*)    Platelets 416 (*)    Neutro Abs  9.4 (*)    Abs Immature Granulocytes 0.08 (*)    All other components within normal limits  COMPREHENSIVE METABOLIC PANEL - Abnormal; Notable for the following components:   Sodium 131 (*)    Potassium 3.4 (*)    Glucose, Bld 102 (*)    BUN 5 (*)    Creatinine, Ser 0.33 (*)    Calcium 8.2 (*)    Total Protein 6.0 (*)    Albumin 2.5 (*)    AST 104 (*)    ALT 89 (*)    Alkaline Phosphatase 980 (*)    Total Bilirubin 3.9 (*)    All other components within normal limits  URINALYSIS, ROUTINE W REFLEX MICROSCOPIC - Abnormal; Notable for the following components:   Color, Urine AMBER (*)    Hgb urine dipstick SMALL (*)    Ketones, ur 20 (*)    Protein, ur 30 (*)    All other components within normal limits  RESP PANEL BY RT-PCR (FLU A&B, COVID) ARPGX2  CULTURE, BLOOD (ROUTINE X 2)  CULTURE, BLOOD (ROUTINE X 2)  LIPASE, BLOOD  PREGNANCY, URINE    EKG None  Radiology CT Abdomen Pelvis W Contrast  Result Date: 11/18/2021 CLINICAL DATA:  Postoperative abdominal pain. ERCP today with stent removal and new stent placed. Pancreatic cancer. EXAM: CT ABDOMEN AND PELVIS WITH CONTRAST TECHNIQUE: Multidetector CT imaging of the abdomen and pelvis was performed using the standard protocol following bolus administration of intravenous contrast. RADIATION DOSE REDUCTION: This exam was performed according to the departmental dose-optimization program which includes automated exposure control, adjustment of the mA and/or kV according to patient size and/or use of iterative reconstruction technique. CONTRAST:  41m OMNIPAQUE IOHEXOL 300 MG/ML  SOLN COMPARISON:  CT abdomen and pelvis 10/23/2021. FINDINGS: Lower chest: No acute abnormality. Hepatobiliary: 13 mm hypodense lesion in the right lobe of the liver has increased in size. New hypodense lesion in the right lobe of the liver measuring 8 mm image 2/21. New mild periportal edema. Common bile duct stent in place. Small amount of pneumobilia present. Mild  gallbladder wall edema. Pancreas: Again seen is fullness of the pancreatic head with mild surrounding inflammatory stranding similar to the prior examination. Pancreatic ductal dilatation in the body and tail with associated atrophy appears unchanged. Spleen: Normal in size without focal abnormality. Adrenals/Urinary Tract: Adrenal glands are unremarkable. Kidneys are normal, without renal calculi, focal lesion, or hydronephrosis. Bladder is unremarkable. Stomach/Bowel: There is questionable wall thickening of the cecum, ascending colon and hepatic flexure versus normal under distension.  Appendix appears within normal limits. There is a large amount of stool throughout the colon. No bowel obstruction or free air. Stomach within normal limits. Vascular/Lymphatic: No significant vascular findings are present. No enlarged abdominal or pelvic lymph nodes. Reproductive: Uterus and bilateral adnexa are unremarkable. Other: There is a small amount of ascites which has mildly increased. No abdominal wall hernia. Musculoskeletal: No acute or significant osseous findings. IMPRESSION: 1. Common bile duct stent in place.  Small amount of pneumobilia. 2. New mild periportal edema of uncertain etiology. 3. Questionable mild gallbladder wall edema. Correlate for cholecystitis. 4. Hypodense hepatic lesion has increased in size. Single new small hepatic lesion. Findings are concerning for worsening metastatic disease. 5. Mild wall thickening of the proximal colon worrisome for nonspecific colitis. 6. Small amount of ascites has increased. Electronically Signed   By: Ronney Asters M.D.   On: 11/18/2021 18:46   DG Abd Acute W/Chest  Result Date: 11/18/2021 CLINICAL DATA:  ERCP today.  Pancreatic cancer.  Pain EXAM: DG ABDOMEN ACUTE WITH 1 VIEW CHEST COMPARISON:  CT abdomen pelvis 10/23/2021 FINDINGS: Lungs are clear without infiltrate or effusion. Negative for heart failure or effusion. Port-A-Cath tip in the SVC Normal bowel gas  pattern. No free air. Common bile duct stent in satisfactory position. IMPRESSION: No active cardiopulmonary disease Normal bowel gas pattern. Electronically Signed   By: Franchot Gallo M.D.   On: 11/18/2021 18:33   US Abdomen Limited RUQ (LIVER/GB)  Result Date: 11/18/2021 CLINICAL DATA:  Right upper quadrant pain EXAM: ULTRASOUND ABDOMEN LIMITED RIGHT UPPER QUADRANT COMPARISON:  10/23/2021 CT FINDINGS: Gallbladder: Gallbladder is well distended. Sludge is noted. No cholelithiasis is seen. Common bile duct: Diameter: Dilated to 17 mm with a stent within. Mild intrahepatic biliary ductal dilatation is noted as well. Liver: No focal lesion identified. Within normal limits in parenchymal echogenicity. Previously seen hypodense lesion in the right lobe of the liver is not well appreciated. Portal vein is patent on color Doppler imaging with normal direction of blood flow towards the liver. Other: Mild ascites is noted. IMPRESSION: Gallbladder sludge. Prominent common bile duct with a stent within. No discrete hepatic lesion is noted.  Minimal ascites is seen. Electronically Signed   By: Inez Catalina M.D.   On: 11/18/2021 03:50    Procedures Procedures    Medications Ordered in ED Medications  piperacillin-tazobactam (ZOSYN) IVPB 3.375 g (has no administration in time range)  sodium chloride 0.9 % bolus 1,000 mL (0 mLs Intravenous Stopped 11/18/21 1915)  ondansetron (ZOFRAN) injection 4 mg (4 mg Intravenous Given 11/18/21 1541)  HYDROmorphone (DILAUDID) injection 1 mg (1 mg Intravenous Given 11/18/21 1541)  HYDROmorphone (DILAUDID) injection 1 mg (1 mg Intravenous Given 11/18/21 1806)  iohexol (OMNIPAQUE) 300 MG/ML solution 80 mL (80 mLs Intravenous Contrast Given 11/18/21 1826)  ketamine 50 mg in normal saline 5 mL (10 mg/mL) syringe (16 mg Intravenous Given 11/18/21 2039)  lactated ringers bolus 2,000 mL (2,000 mLs Intravenous New Bag/Given 11/18/21 2038)  piperacillin-tazobactam (ZOSYN) IVPB 3.375 g (0 g  Intravenous Stopped 11/18/21 2119)    ED Course/ Medical Decision Making/ A&P Clinical Course as of 11/18/21 2216  Tue Nov 18, 2021  1514 MRCP done today at [facility]. DC from this ER this morning for worsening pain. Managed by Dr. Earnestine Mealing at [facility] (oncology). Returns with worsening pain. Taking Morphine and ?? for pain without control (last taken just prior to arrival).  [LM]  4782 Currently being treated for pancreatic cancer, last treatment was 1 month  ago (unsure if chemo or radiation).  [LM]    Clinical Course User Index [LM] Tacy Learn, PA-C                           Medical Decision Making Amount and/or Complexity of Data Reviewed Labs: ordered. Radiology: ordered.  Risk Prescription drug management. Decision regarding hospitalization.   This patient presents to the ED for concern of abdominal pain not controlled with home pain medications with history of pancreatic cancer, ERCP with replacement of occluded stent today, this involves an extensive number of treatment options, and is a complaint that carries with it a high risk of complications and morbidity.  The differential diagnosis includes but not limited to cholelithiasis, acute cholecystitis, pancreatitis, perforation, cholangitis   Co morbidities that complicate the patient evaluation  Pancreatic cancer   Additional history obtained:  Additional history obtained from significant other at bedside after initial evaluation, verifies ERCP today with stent occluded, removed, replaced, has her Cipro with him which she has not started yet.  External records from outside source obtained and reviewed including ERCP completed today at [facility] showing her stent was totally occluded, had biliary sphincterectomy and the stent was removed, a new stent was placed.  Did have pus and bile through her stent.  Patient was discharged with plan for monitoring of LFTs, prescription for Cipro sent into pharmacy.   Lab Tests:  I Ordered,  and personally interpreted labs.  The pertinent results include: CBC with WBC 11.2, hemoglobin 10.2, not significantly changed from labs obtained this morning.  Her CMP shows elevated LFTs with AST of 104, ALT of 89 alk phos 980, total bili 3.4.  Her CMP is without significant changes compared to prior obtained earlier today.  Urinalysis positive for ketones and protein.  Lipase is normal.   Imaging Studies ordered:  I ordered imaging studies including acute abdominal series obtained and is negative for free air I independently visualized and interpreted imaging which showed negative for free air I agree with the radiologist interpretation CT abdomen pelvis with contrast obtained to evaluate for complications from today's ERCP, results as listed.   Medicines ordered and prescription drug management:  I ordered medication including Dilaudid and ketamine for pain Reevaluation of the patient after these medicines showed that the patient  patient reports no pain relief with her Dilaudid IV, proceeded with ketamine I have reviewed the patients home medicines and have made adjustments as needed   Consultations Obtained:  I requested consultation with Dr. Rush Landmark (Dr. Kathrynn Humble, ER attending took call),  and discussed lab and imaging findings as well as pertinent plan - they recommend: Zosyn, HIDA scan, medicine admit, may need further workup or transfer based on HIDA results. Discussed with Dr. Marlowe Sax, [facility], requests consult with GI, after GI consult, will admit for pain management and further work up.   Problem List / ED Course:  41 year old female with history of pancreatic cancer presents today with abdominal pain after ERCP with removal of her occluded stent and new stent placed.  Patient was in the emergency room early this morning with abdominal pain, offered admission at that time however she left the hospital to go complete her scheduled ERCP.  ERCP showed occluded  stent which was removed and replaced and patient was discharged on Cipro with plan to monitor her labs.  Patient return to the emergency room with ongoing pain not controlled with her home pain medications. Found to  have epigastric and right upper quadrant tenderness although exam is not well-tolerated secondary to generalized abdominal pain.  Patient is afebrile, her labs are not significantly changed from those obtained earlier today.  Discussed with Dr. Kathrynn Humble, ER attending who has seen the patient and recommends admission to the hospital for pain management. Consults as above, will be seen by GI team with plan for HIDA scan with admission to the hospital service for pain management.  Ultimately, may require transfer to [facility] where her care team is located however at this time.  Patient is declining transfer to [place] would be preferred to be admitted and managed at this hospital as this is closer to home.    Social Determinants of Health:  Care obtained at [facility], prefers admission at this facility, closer to home.            Final Clinical Impression(s) / ED Diagnoses Final diagnoses:  Generalized abdominal pain  Elevated LFTs  Elevated bilirubin  Malignant neoplasm of pancreas, unspecified location of malignancy [facility])    Rx / DC Orders ED Discharge Orders     None         Tacy Learn, PA-C 11/18/21 2216    Varney Biles, MD 11/18/21 2309

## 2021-11-18 NOTE — H&P (Signed)
History and Physical    Cynthia [name] JSE:[id number] DOB: 12-21-1980 DOA: 11/18/2021  PCP: Clinic, [facility]  Patient coming from: Home  Chief Complaint: Abdominal pain  HPI: Cynthia [name] is a 41 y.o. female with medical history significant of pancreatic adenocarcinoma currently on chemotherapy complicated by biliary obstruction requiring stent, PE on Lovenox, pancreatitis.  Recently admitted on 10/23/2021 for abdominal pain and treated for acute pancreatitis.  She presented to [facility] ED earlier today complaining of abdominal pain, discharged with plan to go to [facility] for scheduled ERCP.  Patient had ERCP done at [facility] today which revealed totally occluded CBD stent which was removed.  There was severe biliary stricture in the lower third of the main bile duct which was malignant appearing and treated with placement of a new stent.  Ascending cholangitis was present and the biliary tree was swept with removal of pus and sludge.  Patient was discharged with a 5-day course of ciprofloxacin.  She returned to [facility] long ED soon after complaining of worsening abdominal pain.  History provided by patient limited.  Reports severe epigastric abdominal pain radiating to her back for the past 1 day.  Pain worse after ERCP today.  Denies fevers or chills.  Denies nausea or vomiting.  Last chemo was a month ago.  No other complaints.  Denies cough, shortness of breath, or chest pain.  ED Course: No fever, tachycardia, or hypotension.  WBC 11.2.  Hemoglobin 10.2, stable.  Sodium 131.  Potassium 3.4.  AST 104, ALT 89, alk phos 980, T. bili 3.9.  Lipase normal.  Blood cultures pending.    CT showing common bile duct stent in place.  Small amount of pneumobilia.  New mild periportal edema of uncertain etiology.  Questionable mild gallbladder wall edema.  Hypodense hepatic lesion has increased in size and single new small hepatic lesion, findings concerning for worsening metastatic disease.  Mild wall  thickening of the proximal colon worrisome for nonspecific colitis.  Small amount of ascites has increased.  Patient was given Dilaudid, ketamine, Zofran, 2 L LR boluses, and Zosyn.  ED physician discussed the case with Dr. Rush Landmark who recommended HIDA scan and antibiotic coverage with Zosyn.  GI will consult in a.m.  Review of Systems:  Review of Systems  All other systems reviewed and are negative.  Past Medical History:  Diagnosis Date   History of pulmonary embolism    Normal pregnancy in multigravida in third trimester 07/18/2018   Pancreatic cancer ([place])    Pancreatitis, acute 12/04/2020   Tobacco abuse     Past Surgical History:  Procedure Laterality Date   BIOPSY  12/02/2020   Procedure: BIOPSY;  Surgeon: Rush Landmark Telford Nab., MD;  Location: [place];  Service: Gastroenterology;;   [place] N/A 02/27/2015   Procedure: CESAREAN SECTION;  Surgeon: Newton Pigg, MD;  Location: [facility];  Service: Obstetrics;  Laterality: N/A;   CESAREAN SECTION N/A 08/03/2018   Procedure: REPEAT CESAREAN SECTION;  Surgeon: Janyth Contes, MD;  Location: [place];  Service: Obstetrics;  Laterality: N/ANira Conn, RNFA   ERCP N/A 12/02/2020   Procedure: ENDOSCOPIC RETROGRADE CHOLANGIOPANCREATOGRAPHY (ERCP);  Surgeon: Irving Copas., MD;  Location: [place];  Service: Gastroenterology;  Laterality: N/A;   ESOPHAGOGASTRODUODENOSCOPY N/A 12/02/2020   Procedure: ESOPHAGOGASTRODUODENOSCOPY (EGD);  Surgeon: Irving Copas., MD;  Location: [place];  Service: Gastroenterology;  Laterality: N/A;   FINE NEEDLE ASPIRATION  12/02/2020   Procedure: FINE NEEDLE  ASPIRATION (FNA) LINEAR;  Surgeon: Rush Landmark Telford Nab., MD;  Location: [place];  Service: Gastroenterology;;   UPPER ESOPHAGEAL ENDOSCOPIC ULTRASOUND (EUS) N/A 12/02/2020   Procedure: UPPER ESOPHAGEAL ENDOSCOPIC ULTRASOUND (EUS);  Surgeon: Irving Copas., MD;   Location: [place];  Service: Gastroenterology;  Laterality: N/A;     reports that she has been smoking cigarettes. She has a 6.50 pack-year smoking history. She has never used smokeless tobacco. She reports that she does not drink alcohol and does not use drugs.  No Known Allergies  Family History  Problem Relation Age of Onset   Hypertension Mother    Hypertension Father    Gastric cancer Maternal Grandmother        dx unknown age   Lung cancer Paternal Grandfather        dx after 21; smoking hx    Prior to Admission medications   Medication Sig Start Date End Date Taking? Authorizing Provider  dicyclomine (BENTYL) 20 MG tablet Take 20 mg by mouth 2 (two) times daily as needed for spasms.   Yes [provider]  enoxaparin (LOVENOX) 80 MG/0.8ML injection Inject 80 mg into the skin daily. 05/28/21  Yes [provider]  HYDROmorphone (DILAUDID) 4 MG tablet Take 2-4 mg by mouth every 4 (four) hours as needed for moderate pain or severe pain. 10/28/21  Yes [provider]  ibuprofen (ADVIL) 200 MG tablet Take 400 mg by mouth every 6 (six) hours as needed for mild pain.   Yes [provider]  morphine (MS CONTIN) 30 MG 12 hr tablet Take 30 mg by mouth every 8 (eight) hours as needed for pain. 10/30/21 12/25/21 Yes [provider]  ondansetron (ZOFRAN-ODT) 8 MG disintegrating tablet Take 8 mg by mouth every 8 (eight) hours as needed for nausea. 07/16/21  Yes [provider]  prochlorperazine (COMPAZINE) 10 MG tablet Take 10 mg by mouth every 6 (six) hours as needed for nausea. 07/17/21  Yes [provider]  diazepam (VALIUM) 5 MG tablet Take 1 tablet (5 mg total) by mouth every 8 (eight) hours as needed for muscle spasms. Patient not taking: Reported on 11/18/2021 02/13/21 02/13/22  Fransico Meadow, PA-C  docusate sodium (COLACE) 100 MG capsule Take 1 capsule (100 mg total) by mouth 2 (two) times daily. Patient not taking: Reported on  11/18/2021 12/05/20   Lavina Hamman, MD  lidocaine-prilocaine (EMLA) cream Apply 1 application topically as needed (for port access). 05/23/21   [provider]    Physical Exam: Vitals:   11/18/21 2100 11/18/21 2130 11/18/21 2200 11/18/21 2334  BP: 135/77 (!) 155/77 (!) 148/75 138/87  Pulse: (!) 57 (!) 58 (!) 57 67  Resp: (!) _0 Temp:    98.7 F (37.1 C)  TempSrc:    Oral  SpO2: 100% 100% 100% 100%  Weight:      Height:        Physical Exam Vitals reviewed.  Constitutional:      General: She is not in acute distress. HENT:     Head: Normocephalic and atraumatic.  Eyes:     Comments: Unable to examine as patient kept her eyes closed.  Cardiovascular:     Rate and Rhythm: Normal rate and regular rhythm.     Pulses: Normal pulses.  Pulmonary:     Effort: Pulmonary effort is normal. No respiratory distress.     Breath sounds: Normal breath sounds. No wheezing or rales.  Abdominal:  General: Bowel sounds are normal. There is no distension.     Palpations: Abdomen is soft.     Tenderness: There is abdominal tenderness. There is no guarding or rebound.     Comments: Epigastrium tender to palpation  Musculoskeletal:        General: No swelling or tenderness.     Cervical back: Normal range of motion.  Skin:    General: Skin is warm and dry.  Neurological:     General: No focal deficit present.     Mental Status: She is alert and oriented to person, place, and time.     Labs on Admission: I have personally reviewed following labs and imaging studies  CBC: Recent Labs  Lab 11/18/21 0139 11/18/21 1517  WBC 11.8* 11.2*  NEUTROABS 9.8* 9.4*  HGB 9.6* 10.2*  HCT 26.4* 29.2*  MCV 79.8* 80.9  PLT 361 878*   Basic Metabolic Panel: Recent Labs  Lab 11/18/21 0139 11/18/21 1517  NA 133* 131*  K 3.2* 3.4*  CL 102 98  CO2 26 28  GLUCOSE 97 102*  BUN 5* 5*  CREATININE <0.30* 0.33*  CALCIUM 7.9* 8.2*  MG  --  1.6*   GFR: Estimated Creatinine  Clearance: 79.2 mL/min (A) (by C-G formula based on SCr of 0.33 mg/dL (L)). Liver Function Tests: Recent Labs  Lab 11/18/21 0139 11/18/21 1517  AST 99* 104*  ALT 87* 89*  ALKPHOS 880* 980*  BILITOT 4.0* 3.9*  PROT 5.6* 6.0*  ALBUMIN 2.3* 2.5*   Recent Labs  Lab 11/18/21 0139 11/18/21 1517  LIPASE 17 16   No results for input(s): AMMONIA in the last 168 hours. Coagulation Profile: No results for input(s): INR, PROTIME in the last 168 hours. Cardiac Enzymes: No results for input(s): CKTOTAL, CKMB, CKMBINDEX, TROPONINI in the last 168 hours. BNP (last 3 results) No results for input(s): PROBNP in the last 8760 hours. HbA1C: No results for input(s): HGBA1C in the last 72 hours. CBG: No results for input(s): GLUCAP in the last 168 hours. Lipid Profile: No results for input(s): CHOL, HDL, LDLCALC, TRIG, CHOLHDL, LDLDIRECT in the last 72 hours. Thyroid Function Tests: No results for input(s): TSH, T4TOTAL, FREET4, T3FREE, THYROIDAB in the last 72 hours. Anemia Panel: No results for input(s): VITAMINB12, FOLATE, FERRITIN, TIBC, IRON, RETICCTPCT in the last 72 hours. Urine analysis:    Component Value Date/Time   COLORURINE AMBER (A) 11/18/2021 1517   APPEARANCEUR CLEAR 11/18/2021 1517   LABSPEC 1.012 11/18/2021 1517   PHURINE 6.0 11/18/2021 1517   GLUCOSEU NEGATIVE 11/18/2021 1517   HGBUR SMALL (A) 11/18/2021 1517   BILIRUBINUR NEGATIVE 11/18/2021 1517   KETONESUR 20 (A) 11/18/2021 1517   PROTEINUR 30 (A) 11/18/2021 1517   UROBILINOGEN 1.0 05/25/2015 0318   NITRITE NEGATIVE 11/18/2021 1517   LEUKOCYTESUR NEGATIVE 11/18/2021 1517    Radiological Exams on Admission: I have personally reviewed images CT Abdomen Pelvis W Contrast  Result Date: 11/18/2021 CLINICAL DATA:  Postoperative abdominal pain. ERCP today with stent removal and new stent placed. Pancreatic cancer. EXAM: CT ABDOMEN AND PELVIS WITH CONTRAST TECHNIQUE: Multidetector CT imaging of the abdomen and pelvis  was performed using the standard protocol following bolus administration of intravenous contrast. RADIATION DOSE REDUCTION: This exam was performed according to the departmental dose-optimization program which includes automated exposure control, adjustment of the mA and/or kV according to patient size and/or use of iterative reconstruction technique. CONTRAST:  61m OMNIPAQUE IOHEXOL 300 MG/ML  SOLN COMPARISON:  CT abdomen and pelvis  10/23/2021. FINDINGS: Lower chest: No acute abnormality. Hepatobiliary: 13 mm hypodense lesion in the right lobe of the liver has increased in size. New hypodense lesion in the right lobe of the liver measuring 8 mm image 2/21. New mild periportal edema. Common bile duct stent in place. Small amount of pneumobilia present. Mild gallbladder wall edema. Pancreas: Again seen is fullness of the pancreatic head with mild surrounding inflammatory stranding similar to the prior examination. Pancreatic ductal dilatation in the body and tail with associated atrophy appears unchanged. Spleen: Normal in size without focal abnormality. Adrenals/Urinary Tract: Adrenal glands are unremarkable. Kidneys are normal, without renal calculi, focal lesion, or hydronephrosis. Bladder is unremarkable. Stomach/Bowel: There is questionable wall thickening of the cecum, ascending colon and hepatic flexure versus normal under distension. Appendix appears within normal limits. There is a large amount of stool throughout the colon. No bowel obstruction or free air. Stomach within normal limits. Vascular/Lymphatic: No significant vascular findings are present. No enlarged abdominal or pelvic lymph nodes. Reproductive: Uterus and bilateral adnexa are unremarkable. Other: There is a small amount of ascites which has mildly increased. No abdominal wall hernia. Musculoskeletal: No acute or significant osseous findings. IMPRESSION: 1. Common bile duct stent in place.  Small amount of pneumobilia. 2. New mild periportal  edema of uncertain etiology. 3. Questionable mild gallbladder wall edema. Correlate for cholecystitis. 4. Hypodense hepatic lesion has increased in size. Single new small hepatic lesion. Findings are concerning for worsening metastatic disease. 5. Mild wall thickening of the proximal colon worrisome for nonspecific colitis. 6. Small amount of ascites has increased. Electronically Signed   By: Ronney Asters M.D.   On: 11/18/2021 18:46   DG Abd Acute W/Chest  Result Date: 11/18/2021 CLINICAL DATA:  ERCP today.  Pancreatic cancer.  Pain EXAM: DG ABDOMEN ACUTE WITH 1 VIEW CHEST COMPARISON:  CT abdomen pelvis 10/23/2021 FINDINGS: Lungs are clear without infiltrate or effusion. Negative for heart failure or effusion. Port-A-Cath tip in the SVC Normal bowel gas pattern. No free air. Common bile duct stent in satisfactory position. IMPRESSION: No active cardiopulmonary disease Normal bowel gas pattern. Electronically Signed   By: Franchot Gallo M.D.   On: 11/18/2021 18:33   US Abdomen Limited RUQ (LIVER/GB)  Result Date: 11/18/2021 CLINICAL DATA:  Right upper quadrant pain EXAM: ULTRASOUND ABDOMEN LIMITED RIGHT UPPER QUADRANT COMPARISON:  10/23/2021 CT FINDINGS: Gallbladder: Gallbladder is well distended. Sludge is noted. No cholelithiasis is seen. Common bile duct: Diameter: Dilated to 17 mm with a stent within. Mild intrahepatic biliary ductal dilatation is noted as well. Liver: No focal lesion identified. Within normal limits in parenchymal echogenicity. Previously seen hypodense lesion in the right lobe of the liver is not well appreciated. Portal vein is patent on color Doppler imaging with normal direction of blood flow towards the liver. Other: Mild ascites is noted. IMPRESSION: Gallbladder sludge. Prominent common bile duct with a stent within. No discrete hepatic lesion is noted.  Minimal ascites is seen. Electronically Signed   By: Inez Catalina M.D.   On: 11/18/2021 03:50    Assessment/Plan Principal  Problem:   Epigastric abdominal pain Active Problems:   Hypokalemia   Hyponatremia   History of pulmonary embolism   Pancreatic adenocarcinoma (HCC)    Assessment and Plan: * Epigastric abdominal pain -Patient with history of pancreatic adenocarcinoma complicated by biliary obstruction requiring stent. ERCP done at [facility] on 11/18/2021 revealed totally occluded CBD stent which was removed.  There was severe biliary stricture in the lower third  of the main bile duct which was malignant appearing and treated with placement of a new stent. Ascending cholangitis was present and the biliary tree was swept with removal of pus and sludge.   -WBC 11.2, no signs of sepsis.  -LFTs elevated but not significantly changed compared to labs during ED visit prior to ERCP. Lipase is normal.  -CT done after ERCP showing common bile duct stent in place.  Small amount of pneumobilia.  New mild periportal edema of uncertain etiology.  Questionable mild gallbladder wall edema.  Hypodense hepatic lesion has increased in size and single new small hepatic lesion, findings concerning for worsening metastatic disease.  Mild wall thickening of the proximal colon worrisome for nonspecific colitis.  Small amount of ascites has increased. -GI consulted and recommended HIDA scan to rule out acute cholecystitis given questionable mild gallbladder wall edema on CT.  HIDA scan ordered, if positive for acute cholecystitis, general surgery needs to be consulted. -Keep n.p.o. and hold home Lovenox -Continue Zosyn per GI recommendations -Dilaudid as needed for pain -IV fluid hydration -Blood cultures drawn -Monitor WBC count -Monitor lipase and LFTs   Hyponatremia Mild. -IV fluid hydration and repeat labs in the morning  Hypokalemia Hypomagnesemia Mild. -Replace potassium and magnesium, continue to monitor.   Pancreatic adenocarcinoma ([place]) Follows at [facility] and currently on chemotherapy.  CT showing findings concerning for  worsening metastatic disease. -Outpatient oncology follow-up  History of pulmonary embolism -Hold Lovenox at this time   DVT prophylaxis:  Hold home Lovenox at this time. Code Status: Full Code Family Communication: No family at bedside.  Diagnostic findings and treatment plan discussed with the patient. Level of care: Telemetry bed Admission status: It is my clinical opinion that referral for OBSERVATION is reasonable and necessary in this patient based on the above information provided. The aforementioned taken together are felt to place the patient at high risk for further clinical deterioration. However, it is anticipated that the patient may be medically stable for discharge from the hospital within 24 to 48 hours.   The medical decision making on this patient was of high complexity and the patient is at high risk for clinical deterioration, therefore this is a level 3 visit.  Shela Leff MD [facility]  If 7PM-7AM, please contact night-coverage [email or web address]  11/19/2021, 2:29 AM

## 2021-11-18 NOTE — Progress Notes (Signed)
PHARMACY NOTE -  Zosyn ? ?Pharmacy has been assisting with dosing of Zosyn for IAI. ?Dosage remains stable at 3.375 g IV q8 hr and further renal adjustments per institutional Pharmacy antibiotic protocol ? ?Pharmacy will sign off, following peripherally for culture results or dose adjustments. Please reconsult if a change in clinical status warrants re-evaluation of dosage. ? ?Reuel Boom, PharmD, BCPS ?[phone] ?11/18/2021, 8:26 PM ? ? ? ?

## 2021-11-18 NOTE — ED Provider Notes (Signed)
[place] DEPT Provider Note  CSN: [id number] Arrival date & time: 11/17/21 2232  Chief Complaint(s) Back Pain and Abdominal Pain  HPI Cynthia [name] is a 41 y.o. female with a past medical history listed below including pancreatic cancer currently on chemotherapy, complicated by biliary obstruction requiring stent here for persistent abdominal pain.  Ongoing for several weeks but worse in the past several days.  Pain radiates to her back.  She denies any nausea or vomiting.  Reports that she is scheduled for ERCP in the morning at [facility] given abnormal labs and imaging performed on Thursday and Friday of last week showing biliary obstruction.  At home patient takes morphine ER and Dilaudid which is not controlling her pain.  The history is provided by the patient.   Past Medical History Past Medical History:  Diagnosis Date   History of pulmonary embolism    Normal pregnancy in multigravida in third trimester 07/18/2018   Pancreatic cancer ([place])    Pancreatitis, acute 12/04/2020   Tobacco abuse    Patient Active Problem List   Diagnosis Date Noted   Liver lesion 10/25/2021   Ascites 10/25/2021   Lymph node enlargement 10/25/2021   Abdominal pain 10/24/2021   Cancer related pain 10/24/2021   Intractable abdominal pain 10/23/2021   Hypokalemia 10/23/2021   Moderate protein malnutrition ([place]) 10/23/2021   Normocytic anemia 10/23/2021   Genetic testing 01/14/2021   Family history of gastric cancer 12/23/2020   Family history of lung cancer 12/23/2020   Pancreatic cancer ([place]) 12/10/2020   Obstructive jaundice 11/30/2020   Pancreatic mass 11/30/2020   History of pulmonary embolus (PE) 11/30/2020   Transaminitis 11/30/2020   Abnormal MRI of the abdomen    Biliary obstruction    Acute pancreatitis 11/26/2020   Choledocholithiasis 11/25/2020   Cholelithiasis 11/25/2020   Pancreatitis 11/25/2020   Diarrhea 11/25/2020   First trimester  pregnancy    Status post repeat low transverse cesarean section 08/03/2018   Pregnancy 07/18/2018   Sinus bradycardia 05/26/2015   PE (pulmonary embolism) 05/25/2015   Cigarette smoker 05/25/2015   Pulmonary emboli ([place])    H/O cesarean section 02/27/2015   Home Medication(s) Prior to Admission medications   Medication Sig Start Date End Date Taking? Authorizing Provider  CREON 24000-76000 units CPEP Take 1 capsule by mouth daily. Patient not taking: Reported on 11/18/2021 04/24/21   [provider]  dexamethasone (DECADRON) 4 MG tablet Take 42m (2 x 467mtablets) by mouth in the morning for 2 days after the first day of each cycle, then as directed. 07/17/21   [provider]  diazepam (VALIUM) 5 MG tablet Take 1 tablet (5 mg total) by mouth every 8 (eight) hours as needed for muscle spasms. 02/13/21 02/13/22  SoFransico MeadowPA-C  dicyclomine (BENTYL) 20 MG tablet Take 1 tablet (20 mg total) by mouth 2 (two) times daily as needed for up to 7 days for spasms. 08/09/21 08/16/21  GrJeanell SparrowDO  docusate sodium (COLACE) 100 MG capsule Take 1 capsule (100 mg total) by mouth 2 (two) times daily. Patient taking differently: Take 100 mg by mouth daily as needed for mild constipation. 12/05/20   PaLavina HammanMD  enoxaparin (LOVENOX) 80 MG/0.8ML injection Inject 80 mg into the skin daily. 05/28/21   [provider]  ibuprofen (ADVIL) 200 MG tablet Take 400 mg by mouth every 6 (six) hours as needed for mild pain.    [provider]  lidocaine-prilocaine (  EMLA) cream Apply 1 application topically as needed (for port access). 05/23/21   [provider]  morphine (MS CONTIN) 30 MG 12 hr tablet Take 30 mg by mouth every 8 (eight) hours as needed for pain. 10/30/21 12/25/21  [provider]  morphine (MSIR) 15 MG tablet Take 15 mg by mouth every 4 (four) hours as needed for pain. 10/30/21 11/29/21  [provider]  ondansetron (ZOFRAN-ODT) 8 MG  disintegrating tablet Take 8 mg by mouth every 8 (eight) hours as needed for nausea. 07/16/21   [provider]  prochlorperazine (COMPAZINE) 10 MG tablet Take 10 mg by mouth every 6 (six) hours as needed for nausea. 07/17/21   [provider]  sucralfate (CARAFATE) 1 g tablet Take 1 tablet (1 g total) by mouth 4 (four) times daily -  with meals and at bedtime for 14 days. 08/09/21 08/23/21  Jeanell Sparrow, DO                                                                                                                                    Allergies Patient has no known allergies.  Review of Systems Review of Systems As noted in HPI  Physical Exam Vital Signs  I have reviewed the triage vital signs BP 120/76 (BP Location: Left Arm)    Pulse 70    Temp 98 F (36.7 C) (Oral)    Resp 20    Ht 5' 5" (1.651 m)    Wt 52.6 kg    LMP  (LMP Unknown)    SpO2 100%    BMI 19.30 kg/m   Physical Exam Vitals reviewed.  Constitutional:      General: She is not in acute distress.    Appearance: She is well-developed. She is not diaphoretic.  HENT:     Head: Normocephalic and atraumatic.     Right Ear: External ear normal.     Left Ear: External ear normal.     Nose: Nose normal.  Eyes:     General: No scleral icterus.    Conjunctiva/sclera: Conjunctivae normal.  Neck:     Trachea: Phonation normal.  Cardiovascular:     Rate and Rhythm: Normal rate and regular rhythm.  Pulmonary:     Effort: Pulmonary effort is normal. No respiratory distress.     Breath sounds: No stridor.  Abdominal:     General: There is no distension.     Tenderness: There is abdominal tenderness in the right upper quadrant and epigastric area. There is guarding. There is no rebound.  Musculoskeletal:        General: Normal range of motion.     Cervical back: Normal range of motion.  Neurological:     Mental Status: She is alert and oriented to person, place, and time.  Psychiatric:        Behavior:  Behavior normal.    ED Results and Treatments Labs (  all labs ordered are listed, but only abnormal results are displayed) Labs Reviewed  CBC WITH DIFFERENTIAL/PLATELET - Abnormal; Notable for the following components:      Result Value   WBC 11.8 (*)    RBC 3.31 (*)    Hemoglobin 9.6 (*)    HCT 26.4 (*)    MCV 79.8 (*)    MCHC 36.4 (*)    Neutro Abs 9.8 (*)    All other components within normal limits  COMPREHENSIVE METABOLIC PANEL - Abnormal; Notable for the following components:   Sodium 133 (*)    Potassium 3.2 (*)    BUN 5 (*)    Creatinine, Ser <0.30 (*)    Calcium 7.9 (*)    Total Protein 5.6 (*)    Albumin 2.3 (*)    AST 99 (*)    ALT 87 (*)    Alkaline Phosphatase 880 (*)    Total Bilirubin 4.0 (*)    All other components within normal limits  LIPASE, BLOOD                                                                                                                         EKG  EKG Interpretation  Date/Time:    Ventricular Rate:    PR Interval:    QRS Duration:   QT Interval:    QTC Calculation:   R Axis:     Text Interpretation:         Radiology US Abdomen Limited RUQ (LIVER/GB)  Result Date: 11/18/2021 CLINICAL DATA:  Right upper quadrant pain EXAM: ULTRASOUND ABDOMEN LIMITED RIGHT UPPER QUADRANT COMPARISON:  10/23/2021 CT FINDINGS: Gallbladder: Gallbladder is well distended. Sludge is noted. No cholelithiasis is seen. Common bile duct: Diameter: Dilated to 17 mm with a stent within. Mild intrahepatic biliary ductal dilatation is noted as well. Liver: No focal lesion identified. Within normal limits in parenchymal echogenicity. Previously seen hypodense lesion in the right lobe of the liver is not well appreciated. Portal vein is patent on color Doppler imaging with normal direction of blood flow towards the liver. Other: Mild ascites is noted. IMPRESSION: Gallbladder sludge. Prominent common bile duct with a stent within. No discrete hepatic lesion is  noted.  Minimal ascites is seen. Electronically Signed   By: Inez Catalina M.D.   On: 11/18/2021 03:50    Pertinent labs & imaging results that were available during my care of the patient were reviewed by me and considered in my medical decision making (see MDM for details).  Medications Ordered in ED Medications  ondansetron (ZOFRAN) injection 4 mg (4 mg Intravenous Given 11/18/21 0140)  sodium chloride 0.9 % bolus 1,000 mL (0 mLs Intravenous Stopped 11/18/21 0323)  HYDROmorphone (DILAUDID) injection 1 mg (1 mg Intravenous Given 11/18/21 0141)  HYDROmorphone (DILAUDID) injection 0.5 mg (0.5 mg Intravenous Given 11/18/21 0227)  HYDROmorphone (DILAUDID) injection 1 mg (1 mg Intravenous Given 11/18/21 0407)  HYDROmorphone (DILAUDID) injection 0.5 mg (0.5 mg Intravenous Given 11/18/21  0523)                                                                                                                                     Procedures Procedures  (including critical care time)  Medical Decision Making / ED Course    Complexity of Problem:  Co-morbidities/SDOH that complicate the patient evaluation/care: As noted in HPI  Additional history obtained: Medical records from [facility], including recent oncology clinic visit, labs, and imaging that were notable for biliary obstruction.  Patient's presenting problem/concern and DDX listed below: Worsening biliary obstruction, pancreatitis, cholecystitis, ascending cholangitis.     Complexity of Data:   Cardiac Monitoring: The patient was maintained on a cardiac monitor.   I personally viewed and interpreted the cardiac monitored which showed an underlying rhythm of normal sinus rhythm with rates in the 60s.  No dysrhythmias or blocks  Laboratory Tests ordered listed below with my independent interpretation: CBC with mild leukocytosis, increased from labs drawn on the second.  Stable hemoglobin Evidence of biliary obstruction with slightly improved AST and  ALT with slightly worsening alk phos and bilirubin. Lipase within normal limits   Imaging Studies ordered listed below with my independent interpretation: Right upper quadrant ultrasound without evidence of acute cholecystitis.  It did reveal CBD dilatation with stent in place.  Radiology confirmed.      ED Course:    Hospitalization Considered:  Yes  Assessment, Intervention, and Reassessment: Abdominal pain  Patient given IV fluids , IV antiemetic , and several rounds of IV pain medicine Pain is somewhat controlled. Work up shows slightly worsening in her biliary obstruction now with leukocytosis She is currently afebrile and without signs of obvious infection requiring emergent intervention at this time.  She is scheduled for ERCP at [facility]. Patient would like to take POV to [facility] for her scheduled ERCP instead of being admitted and transferred. Given her clinical stability and close follow up, if feel that this is a reasonable plan.   Final Clinical Impression(s) / ED Diagnoses Final diagnoses:  RUQ pain  Pancreatic cancer metastasized to liver [facility])  Biliary obstruction due to cancer [facility])   The patient appears reasonably screened and/or stabilized for discharge and I doubt any other medical condition or other [facility] requiring further screening, evaluation, or treatment in the ED at this time prior to discharge. Safe for discharge with strict return precautions.  Disposition: Discharge  Condition: Good  I have discussed the results, Dx and Tx plan with the patient/family who expressed understanding and agree(s) with the plan. Discharge instructions discussed at length. The patient/family was given strict return precautions who verbalized understanding of the instructions. No further questions at time of discharge.    ED Discharge Orders     None        Follow Up: [place] today as scheduled for your ERCP

## 2021-11-18 NOTE — ED Triage Notes (Signed)
Patient states she had an ERCP today at [facility]. Patient was seen yesterday for the same pain and was discharged 0530 today. ? ?Patient also c/o nausea. Patient reports a history of pancreatic cancer. ?

## 2021-11-19 ENCOUNTER — Encounter (HOSPITAL_COMMUNITY): Payer: Self-pay | Admitting: Internal Medicine

## 2021-11-19 DIAGNOSIS — E871 Hypo-osmolality and hyponatremia: Secondary | ICD-10-CM | POA: Diagnosis present

## 2021-11-19 DIAGNOSIS — E876 Hypokalemia: Secondary | ICD-10-CM | POA: Diagnosis present

## 2021-11-19 DIAGNOSIS — Z681 Body mass index (BMI) 19 or less, adult: Secondary | ICD-10-CM | POA: Diagnosis not present

## 2021-11-19 DIAGNOSIS — K769 Liver disease, unspecified: Secondary | ICD-10-CM | POA: Diagnosis present

## 2021-11-19 DIAGNOSIS — R188 Other ascites: Secondary | ICD-10-CM | POA: Diagnosis present

## 2021-11-19 DIAGNOSIS — R1013 Epigastric pain: Secondary | ICD-10-CM | POA: Diagnosis present

## 2021-11-19 DIAGNOSIS — R109 Unspecified abdominal pain: Secondary | ICD-10-CM | POA: Diagnosis present

## 2021-11-19 DIAGNOSIS — G893 Neoplasm related pain (acute) (chronic): Secondary | ICD-10-CM | POA: Diagnosis present

## 2021-11-19 DIAGNOSIS — R1084 Generalized abdominal pain: Secondary | ICD-10-CM | POA: Diagnosis present

## 2021-11-19 DIAGNOSIS — Z8507 Personal history of malignant neoplasm of pancreas: Secondary | ICD-10-CM | POA: Diagnosis not present

## 2021-11-19 DIAGNOSIS — C259 Malignant neoplasm of pancreas, unspecified: Secondary | ICD-10-CM | POA: Diagnosis present

## 2021-11-19 DIAGNOSIS — E43 Unspecified severe protein-calorie malnutrition: Secondary | ICD-10-CM | POA: Diagnosis present

## 2021-11-19 DIAGNOSIS — K828 Other specified diseases of gallbladder: Secondary | ICD-10-CM | POA: Diagnosis present

## 2021-11-19 DIAGNOSIS — F17210 Nicotine dependence, cigarettes, uncomplicated: Secondary | ICD-10-CM | POA: Diagnosis present

## 2021-11-19 DIAGNOSIS — G8929 Other chronic pain: Secondary | ICD-10-CM | POA: Diagnosis present

## 2021-11-19 DIAGNOSIS — Z79899 Other long term (current) drug therapy: Secondary | ICD-10-CM | POA: Diagnosis not present

## 2021-11-19 DIAGNOSIS — Z86711 Personal history of pulmonary embolism: Secondary | ICD-10-CM | POA: Diagnosis not present

## 2021-11-19 DIAGNOSIS — D630 Anemia in neoplastic disease: Secondary | ICD-10-CM | POA: Diagnosis not present

## 2021-11-19 DIAGNOSIS — K831 Obstruction of bile duct: Secondary | ICD-10-CM | POA: Diagnosis present

## 2021-11-19 DIAGNOSIS — Z515 Encounter for palliative care: Secondary | ICD-10-CM | POA: Diagnosis not present

## 2021-11-19 DIAGNOSIS — K819 Cholecystitis, unspecified: Secondary | ICD-10-CM | POA: Diagnosis present

## 2021-11-19 DIAGNOSIS — E559 Vitamin D deficiency, unspecified: Secondary | ICD-10-CM | POA: Diagnosis present

## 2021-11-19 DIAGNOSIS — Z8673 Personal history of transient ischemic attack (TIA), and cerebral infarction without residual deficits: Secondary | ICD-10-CM | POA: Diagnosis not present

## 2021-11-19 DIAGNOSIS — Z539 Procedure and treatment not carried out, unspecified reason: Secondary | ICD-10-CM | POA: Diagnosis present

## 2021-11-19 DIAGNOSIS — R7989 Other specified abnormal findings of blood chemistry: Secondary | ICD-10-CM | POA: Diagnosis not present

## 2021-11-19 DIAGNOSIS — Z801 Family history of malignant neoplasm of trachea, bronchus and lung: Secondary | ICD-10-CM | POA: Diagnosis not present

## 2021-11-19 LAB — CBC
HCT: 25.7 % — ABNORMAL LOW (ref 36.0–46.0)
Hemoglobin: 8.9 g/dL — ABNORMAL LOW (ref 12.0–15.0)
MCH: 28.1 pg (ref 26.0–34.0)
MCHC: 34.6 g/dL (ref 30.0–36.0)
MCV: 81.1 fL (ref 80.0–100.0)
Platelets: 366 10*3/uL (ref 150–400)
RBC: 3.17 MIL/uL — ABNORMAL LOW (ref 3.87–5.11)
RDW: 13.4 % (ref 11.5–15.5)
WBC: 10.4 10*3/uL (ref 4.0–10.5)
nRBC: 0 % (ref 0.0–0.2)

## 2021-11-19 LAB — COMPREHENSIVE METABOLIC PANEL
ALT: 58 U/L — ABNORMAL HIGH (ref 0–44)
AST: 43 U/L — ABNORMAL HIGH (ref 15–41)
Albumin: 2.1 g/dL — ABNORMAL LOW (ref 3.5–5.0)
Alkaline Phosphatase: 713 U/L — ABNORMAL HIGH (ref 38–126)
Anion gap: 8 (ref 5–15)
BUN: 5 mg/dL — ABNORMAL LOW (ref 6–20)
CO2: 23 mmol/L (ref 22–32)
Calcium: 7.7 mg/dL — ABNORMAL LOW (ref 8.9–10.3)
Chloride: 101 mmol/L (ref 98–111)
Creatinine, Ser: 0.36 mg/dL — ABNORMAL LOW (ref 0.44–1.00)
GFR, Estimated: >60 mL/min (ref >60)
Glucose, Bld: 87 mg/dL (ref 70–99)
Potassium: 3.5 mmol/L (ref 3.5–5.1)
Sodium: 132 mmol/L — ABNORMAL LOW (ref 135–145)
Total Bilirubin: 1.9 mg/dL — ABNORMAL HIGH (ref 0.3–1.2)
Total Protein: 4.8 g/dL — ABNORMAL LOW (ref 6.5–8.1)

## 2021-11-19 LAB — LIPASE, BLOOD: Lipase: 15 U/L (ref 11–51)

## 2021-11-19 LAB — MAGNESIUM: Magnesium: 1.7 mg/dL (ref 1.7–2.4)

## 2021-11-19 MED ORDER — MAGNESIUM SULFATE 2 GM/50ML IV SOLN
2.0000 g | Freq: Once | INTRAVENOUS | Status: AC
  Administered 2021-11-19: 2 g via INTRAVENOUS
  Filled 2021-11-19: qty 50

## 2021-11-19 MED ORDER — KETOROLAC TROMETHAMINE 15 MG/ML IJ SOLN: 15.0000 mg | Freq: Four times a day (QID) | INTRAMUSCULAR | Status: DC | PRN

## 2021-11-19 MED ORDER — MAGNESIUM SULFATE IN D5W 1-5 GM/100ML-% IV SOLN
1.0000 g | Freq: Once | INTRAVENOUS | Status: AC
  Administered 2021-11-19: 1 g via INTRAVENOUS
  Filled 2021-11-19: qty 100

## 2021-11-19 MED ORDER — SODIUM CHLORIDE 0.9 % IV SOLN: INTRAVENOUS | Status: DC

## 2021-11-19 MED ORDER — HYDROMORPHONE HCL 1 MG/ML IJ SOLN
1.0000 mg | INTRAMUSCULAR | Status: DC | PRN
  Administered 2021-11-19 – 2021-11-20 (×13): 1 mg via INTRAVENOUS
  Filled 2021-11-19 (×14): qty 1

## 2021-11-19 MED ORDER — HYDROMORPHONE HCL 2 MG PO TABS
2.0000 mg | ORAL_TABLET | ORAL | Status: DC | PRN
  Administered 2021-11-19 (×2): 2 mg via ORAL
  Administered 2021-11-19: 4 mg via ORAL
  Administered 2021-11-19: 2 mg via ORAL
  Administered 2021-11-20 – 2021-11-22 (×2): 4 mg via ORAL
  Administered 2021-11-25 – 2021-11-26 (×6): 2 mg via ORAL
  Administered 2021-11-26: 4 mg via ORAL
  Administered 2021-11-26: 2 mg via ORAL
  Filled 2021-11-19 (×2): qty 1
  Filled 2021-11-19 (×4): qty 2
  Filled 2021-11-19 (×4): qty 1
  Filled 2021-11-19 (×3): qty 2
  Filled 2021-11-19: qty 1
  Filled 2021-11-19 (×2): qty 2

## 2021-11-19 MED ORDER — ENOXAPARIN SODIUM 60 MG/0.6ML IJ SOSY
50.0000 mg | PREFILLED_SYRINGE | Freq: Two times a day (BID) | INTRAMUSCULAR | Status: AC
  Administered 2021-11-20 (×2): 50 mg via SUBCUTANEOUS
  Filled 2021-11-19 (×2): qty 0.6

## 2021-11-19 MED ORDER — PANTOPRAZOLE SODIUM 40 MG IV SOLR
40.0000 mg | Freq: Two times a day (BID) | INTRAVENOUS | Status: DC
  Administered 2021-11-19 – 2021-11-22 (×8): 40 mg via INTRAVENOUS
  Filled 2021-11-19 (×8): qty 10

## 2021-11-19 MED ORDER — BISACODYL 10 MG RE SUPP
10.0000 mg | Freq: Once | RECTAL | Status: AC
  Administered 2021-11-19: 10 mg via RECTAL
  Filled 2021-11-19: qty 1

## 2021-11-19 MED ORDER — HYDROMORPHONE HCL 1 MG/ML IJ SOLN
1.0000 mg | INTRAMUSCULAR | Status: DC | PRN
  Administered 2021-11-19 (×3): 1 mg via INTRAVENOUS
  Filled 2021-11-19 (×2): qty 1

## 2021-11-19 MED ORDER — ENSURE ENLIVE PO LIQD
237.0000 mL | Freq: Two times a day (BID) | ORAL | Status: DC
  Administered 2021-11-22 – 2021-11-26 (×5): 237 mL via ORAL

## 2021-11-19 MED ORDER — MORPHINE SULFATE ER 30 MG PO TBCR
30.0000 mg | EXTENDED_RELEASE_TABLET | Freq: Three times a day (TID) | ORAL | Status: DC | PRN
Start: 2021-11-19 — End: 2021-11-19

## 2021-11-19 MED ORDER — DEXTROSE IN LACTATED RINGERS 5 % IV SOLN: INTRAVENOUS | Status: DC

## 2021-11-19 MED ORDER — MORPHINE SULFATE ER 30 MG PO TBCR
30.0000 mg | EXTENDED_RELEASE_TABLET | Freq: Two times a day (BID) | ORAL | Status: DC
  Administered 2021-11-19: 30 mg via ORAL
  Filled 2021-11-19: qty 1

## 2021-11-19 NOTE — Assessment & Plan Note (Signed)
Follows at [facility] and currently on chemotherapy.  CT showing findings concerning for worsening metastatic disease. -Outpatient oncology follow-up

## 2021-11-19 NOTE — Assessment & Plan Note (Addendum)
-  Patient with history of pancreatic adenocarcinoma complicated by biliary obstruction requiring stent. ERCP done at [facility] on 11/18/2021 revealed totally occluded CBD stent which was removed.  There was severe biliary stricture in the lower third of the main bile duct which was malignant appearing and treated with placement of a new stent. Ascending cholangitis was present and the biliary tree was swept with removal of pus and sludge.   -CT done after ERCP showing common bile duct stent in place. Questionable mild gallbladder wall edema.  Hypodense hepatic lesion has increased in size and single new small hepatic lesion, findings concerning for worsening metastatic disease.  -GI consulted and recommended HIDA scan initially. Patient refuse HIDA scan does not want to be off pain meds. -Surgery consulted, recommended continue with IV antibiotics. If no improvement might need cholecystostomy tube.  -Dilaudid as needed for pain. Resume home MS contin.  -IV fluid hydration -Blood culture no growth to date.  -Monitor lipase and LFTs. LFT trending down.  -IV protonix. Treated initially with IV zosyn, now on Augmentin.  -Repeated US: Layering sludge in the gallbladder with gallbladder wall thickening and positive sonographic Murphy sign. Biliary duct stent in place. Gallbladder sludge and wall thickening could be secondary to biliary stasis/reactive. -Underwent to ERCP 3/10 Dr Ardis Hughs couldn't advance  duodenoscope past distal duodenal bulb. Case was discussed with patient oncologist from [facility], he think patient pain is related to cancer, not related to stent malfunction  or gallbladder diseases.  -Plan for medical management and pain controlled  -Palliative consulted to assist with pain management. MS contin was increased to '60mg'$  TID> IV dilaudid change to IV fentanyl.  Appreciate Dr Domingo Cocking assistance.

## 2021-11-19 NOTE — Consult Note (Signed)
Cynthia [name] 01/23/1981  [id number].    Requesting MD: Dr. Shela Leff Chief Complaint/Reason for Consult: pancreatic cancer, RUQ abdominal pain, evaluate for cholecystitis  HPI:  This is a 41 yo black female with a history of CVA s/p c-section in 2012, PE s/p c-section in 2106, tobacco abuse, and pancreatic cancer with possible liver mets who is treated at [facility]  for this malignancy.  In March of 2022 when she was [redacted] weeks pregnant she was noted to have mid upper abdominal pain.  She was found to have a pancreatic mass and underwent EUS with biopsy confirming pancreatic adenocarcinoma.  She was treated with Gemcitabine/Abraxane with the expectation of pre-op neoadjuvant therapy to get her through her pregnancy and then consider possible surgical intervention.  Her LFTs were elevated at the time of diagnosis and she required an ERCP with metal stent placement.  She delivered in the fall of 2022 and was resume on her chemo regimen 11/2.  She was changed to modified Folfirinox 07/31/21.  She has unfortunately missed several of her chemo treatments according to the notes from [place].  She last saw oncology on 3/2 and after some image follow up she had 2 new areas in her liver parenchyma that were possible mets vs bilomas secondary to dilated biliary ducts.  She is supposed to be getting an MRI to further characterize these lesions.    Unfortunately, she began having pain several days ago and her LFTs were noted to be worsening.  She was also noted to have another positive pregnancy test (although this is now negative here).  She was referred to GI for evaluation.  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  This was removed and purulent drainage c/w cholangitis was noted from the duct.  A new metal stent was placed.   She was discharged home from PACU with 5 days of Cipro.   The patient presented to [facility] last night after this procedure with persistent abdominal pain.  She is a very poor historian who will barely interact with me or speak to me.  Most history of obtained from the chart.  She will not be more specific, but states this pain started "several days" ago and is the same pain pre and post ERCP.  It has not worsened since her ERCP per se.  She appears to take MS contin 30 mg q 12 hrs as well as oral dilaudid 4 mg q 4 hrs prn at home for pain control.  She denies any N/V.  She hasn't been eating much but this is chronic it sounds like.  [facility] does note that she has been losing weight in their notes.  She last had a BM 3-4 days ago.  She denies any fevers, but admits to some chills.  She denies CP, SOB, dysuria, etc.  In the ED, she underwent a CT scan that reveals her stent in place with some mild periportal edema and questionable mild gallbladder wall edema.  The hepatic lesion noted on prior scan has increased in size and a new small hepatic lesion was noted, concerning for metastatic disease.  The patient was admitted here, despite all of her care being performed at [facility] even with a procedure on the day of her presentation here.  A HIDA scan was recommended to rule out cholecystitis.  Her  LFTs does show improvement indicating her new stent is working.  We have been asked to see to evaluate her gallbladder.  ROS: ROS: Please see HPI, otherwise all other systems have been reviewed and seem negative.  Family History  Problem Relation Age of Onset   Hypertension Mother    Hypertension Father    Gastric cancer Maternal Grandmother        dx unknown age   Lung cancer Paternal Grandfather        dx after 49; smoking hx    Past Medical History:  Diagnosis Date   CVA (cerebral vascular accident) ([place]) 2012   s/p c-section   History of pulmonary embolism    Normal pregnancy in multigravida in third trimester  07/18/2018   Pancreatic cancer ([place])    Pancreatitis, acute 12/04/2020   Tobacco abuse     Past Surgical History:  Procedure Laterality Date   BIOPSY  12/02/2020   Procedure: BIOPSY;  Surgeon: Irving Copas., MD;  Location: [place];  Service: Gastroenterology;;   CESAREAN SECTION     CESAREAN SECTION N/A 02/27/2015   Procedure: CESAREAN SECTION;  Surgeon: Newton Pigg, MD;  Location: [facility];  Service: Obstetrics;  Laterality: N/A;   CESAREAN SECTION N/A 08/03/2018   Procedure: REPEAT CESAREAN SECTION;  Surgeon: Janyth Contes, MD;  Location: [place];  Service: Obstetrics;  Laterality: N/ANira Conn, RNFA   ERCP N/A 12/02/2020   Procedure: ENDOSCOPIC RETROGRADE CHOLANGIOPANCREATOGRAPHY (ERCP);  Surgeon: Irving Copas., MD;  Location: [place];  Service: Gastroenterology;  Laterality: N/A;   ESOPHAGOGASTRODUODENOSCOPY N/A 12/02/2020   Procedure: ESOPHAGOGASTRODUODENOSCOPY (EGD);  Surgeon: Irving Copas., MD;  Location: [place];  Service: Gastroenterology;  Laterality: N/A;   FINE NEEDLE ASPIRATION  12/02/2020   Procedure: FINE NEEDLE ASPIRATION (FNA) LINEAR;  Surgeon: Irving Copas., MD;  Location: [place];  Service: Gastroenterology;;   UPPER ESOPHAGEAL ENDOSCOPIC ULTRASOUND (EUS) N/A 12/02/2020   Procedure: UPPER ESOPHAGEAL ENDOSCOPIC ULTRASOUND (EUS);  Surgeon: Irving Copas., MD;  Location: [place];  Service: Gastroenterology;  Laterality: N/A;    Social History:  reports that she has been smoking cigarettes. She has a 6.50 pack-year smoking history. She has never used smokeless tobacco. She reports that she does not drink alcohol and does not use drugs.  Allergies: No Known Allergies  Medications Prior to Admission  Medication Sig Dispense Refill   dicyclomine (BENTYL) 20 MG tablet Take 20 mg by mouth 2 (two) times daily as needed for spasms.     enoxaparin (LOVENOX) 80 MG/0.8ML injection Inject 80 mg  into the skin daily.     HYDROmorphone (DILAUDID) 4 MG tablet Take 2-4 mg by mouth every 4 (four) hours as needed for moderate pain or severe pain.     ibuprofen (ADVIL) 200 MG tablet Take 400 mg by mouth every 6 (six) hours as needed for mild pain.     morphine (MS CONTIN) 30 MG 12 hr tablet Take 30 mg by mouth every 8 (eight) hours as needed for pain.     ondansetron (ZOFRAN-ODT) 8 MG disintegrating tablet Take 8 mg by mouth every 8 (eight) hours as needed for nausea.     prochlorperazine (COMPAZINE) 10 MG tablet Take 10 mg by mouth every 6 (six) hours as needed for nausea.     diazepam (VALIUM) 5 MG tablet Take 1 tablet (5 mg total) by mouth every 8 (eight) hours as needed for muscle spasms. (Patient not taking: Reported on 11/18/2021) 9 tablet 0  docusate sodium (COLACE) 100 MG capsule Take 1 capsule (100 mg total) by mouth 2 (two) times daily. (Patient not taking: Reported on 11/18/2021) 10 capsule 0   lidocaine-prilocaine (EMLA) cream Apply 1 application topically as needed (for port access).       Physical Exam: Blood pressure (!) 144/65, pulse (!) 58, temperature 98.8 F (37.1 C), resp. rate 16, height '5\' 5"'$  (1.651 m), weight 52.6 kg, SpO2 99 %, unknown if currently breastfeeding. General: WD, WN black female who is laying in bed in NAD, but seems withdrawn and won't really interact HEENT: head is normocephalic, atraumatic.  Sclera are noninjected.  PERRL.  Ears and nose without any masses or lesions.  Mouth is pink and moist Heart: regular, rate, and rhythm.  Normal s1,s2. No obvious murmurs, gallops, or rubs noted.  Palpable radial and pedal pulses bilaterally Lungs: CTAB, no wheezes, rhonchi, or rales noted.  Respiratory effort nonlabored Abd: soft, mildly tender in epigastrium.  Doesn't seem overly tender in RUQ c/w Murphy's sign, ND, +BS, no masses, hernias, or organomegaly MS: all 4 extremities are symmetrical with no cyanosis, clubbing, or edema. Skin: warm and dry with no masses,  lesions, or rashes Neuro: Cranial nerves 2-12 grossly intact, sensation is normal throughout Psych: A&Ox3 but very flat affect and will barely answer questions with more than one word answers.   Results for orders placed or performed during the hospital encounter of 11/18/21 (from the past 48 hour(s))  CBC with Differential     Status: Abnormal   Collection Time: 11/18/21  3:17 PM  Result Value Ref Range   WBC 11.2 (H) 4.0 - 10.5 K/uL   RBC 3.61 (L) 3.87 - 5.11 MIL/uL   Hemoglobin 10.2 (L) 12.0 - 15.0 g/dL   HCT 29.2 (L) 36.0 - 46.0 %   MCV 80.9 80.0 - 100.0 fL   MCH 28.3 26.0 - 34.0 pg   MCHC 34.9 30.0 - 36.0 g/dL   RDW 13.6 11.5 - 15.5 %   Platelets 416 (H) 150 - 400 K/uL   nRBC 0.0 0.0 - 0.2 %   Neutrophils Relative % 84 %   Neutro Abs 9.4 (H) 1.7 - 7.7 K/uL   Lymphocytes Relative 10 %   Lymphs Abs 1.2 0.7 - 4.0 K/uL   Monocytes Relative 5 %   Monocytes Absolute 0.5 0.1 - 1.0 K/uL   Eosinophils Relative 0 %   Eosinophils Absolute 0.0 0.0 - 0.5 K/uL   Basophils Relative 0 %   Basophils Absolute 0.0 0.0 - 0.1 K/uL   Immature Granulocytes 1 %   Abs Immature Granulocytes 0.08 (H) 0.00 - 0.07 K/uL    Comment: Performed at [facility], [place] [address] [place], [place] [address]  Comprehensive metabolic panel     Status: Abnormal   Collection Time: 11/18/21  3:17 PM  Result Value Ref Range   Sodium 131 (L) 135 - 145 mmol/L   Potassium 3.4 (L) 3.5 - 5.1 mmol/L   Chloride 98 98 - 111 mmol/L   CO2 28 22 - 32 mmol/L   Glucose, Bld 102 (H) 70 - 99 mg/dL    Comment: Glucose reference range applies only to samples taken after fasting for at least 8 hours.   BUN 5 (L) 6 - 20 mg/dL   Creatinine, Ser 0.33 (L) 0.44 - 1.00 mg/dL   Calcium 8.2 (L) 8.9 - 10.3 mg/dL   Total Protein 6.0 (L) 6.5 - 8.1 g/dL   Albumin 2.5 (L) 3.5 -  5.0 g/dL   AST 104 (H) 15 - 41 U/L   ALT 89 (H) 0 - 44 U/L   Alkaline Phosphatase 980 (H) 38 - 126 U/L   Total Bilirubin 3.9 (H) 0.3 - 1.2  mg/dL   GFR, Estimated >60 >60 mL/min    Comment: (NOTE) Calculated using the CKD-EPI Creatinine Equation (2021)    Anion gap 5 5 - 15    Comment: Performed at [facility], [place] [address], [place], [place] [address]  Lipase, blood     Status: None   Collection Time: 11/18/21  3:17 PM  Result Value Ref Range   Lipase 16 11 - 51 U/L    Comment: Performed at [facility], [place] [address], [place], [place] [address]  Urinalysis, Routine w reflex microscopic Urine, Clean Catch     Status: Abnormal   Collection Time: 11/18/21  3:17 PM  Result Value Ref Range   Color, Urine AMBER (A) YELLOW    Comment: BIOCHEMICALS MAY BE AFFECTED BY COLOR   APPearance CLEAR CLEAR   Specific Gravity, Urine 1.012 1.005 - 1.030   pH 6.0 5.0 - 8.0   Glucose, UA NEGATIVE NEGATIVE mg/dL   Hgb urine dipstick SMALL (A) NEGATIVE   Bilirubin Urine NEGATIVE NEGATIVE   Ketones, ur 20 (A) NEGATIVE mg/dL   Protein, ur 30 (A) NEGATIVE mg/dL   Nitrite NEGATIVE NEGATIVE   Leukocytes,Ua NEGATIVE NEGATIVE   RBC / HPF 6-10 0 - 5 RBC/hpf   WBC, UA 0-5 0 - 5 WBC/hpf   Bacteria, UA NONE SEEN NONE SEEN   Squamous Epithelial / LPF 0-5 0 - 5   Mucus PRESENT     Comment: Performed at [facility], [place] [address] [place], [place] [address]  Magnesium     Status: Abnormal   Collection Time: 11/18/21  3:17 PM  Result Value Ref Range   Magnesium 1.6 (L) 1.7 - 2.4 mg/dL    Comment: Performed at [facility], [place] [address], [place], [place] [address]  Pregnancy, urine     Status: None   Collection Time: 11/18/21  5:14 PM  Result Value Ref Range   Preg Test, Ur NEGATIVE NEGATIVE    Comment:        THE SENSITIVITY OF THIS METHODOLOGY IS >20 mIU/mL. Performed at [facility], [place] [address], [place], [place] [address]   Blood culture (routine x 2)     Status: None (Preliminary result)   Collection Time: 11/18/21  9:59 PM    Specimen: BLOOD LEFT HAND  Result Value Ref Range   Specimen Description      BLOOD LEFT HAND Performed at [facility] [address], [place], [place] [address]    Special Requests      BOTTLES DRAWN AEROBIC AND ANAEROBIC Blood Culture adequate volume Performed at [facility] [address], [place], [place] [address]    Culture      NO GROWTH < 12 HOURS Performed at [facility] [address], [place], [place] [address]    Report Status PENDING   Blood culture (routine x 2)     Status: None (Preliminary result)   Collection Time: 11/19/21  3:55 AM   Specimen: BLOOD RIGHT HAND  Result Value Ref Range   Specimen Description      BLOOD RIGHT HAND Performed at [facility] [address], [place], [place] [address]  Special Requests      BOTTLES DRAWN AEROBIC ONLY Blood Culture adequate volume Performed at [facility] [address], [place], [place] [address]    Culture      NO GROWTH < 12 HOURS Performed at [facility] [address] [place], [place] [address]    Report Status PENDING   CBC     Status: Abnormal   Collection Time: 11/19/21  3:55 AM  Result Value Ref Range   WBC 10.4 4.0 - 10.5 K/uL   RBC 3.17 (L) 3.87 - 5.11 MIL/uL   Hemoglobin 8.9 (L) 12.0 - 15.0 g/dL   HCT 25.7 (L) 36.0 - 46.0 %   MCV 81.1 80.0 - 100.0 fL   MCH 28.1 26.0 - 34.0 pg   MCHC 34.6 30.0 - 36.0 g/dL   RDW 13.4 11.5 - 15.5 %   Platelets 366 150 - 400 K/uL   nRBC 0.0 0.0 - 0.2 %    Comment: Performed at [facility], [place] [address], [place], [place] [address]  Comprehensive metabolic panel     Status: Abnormal   Collection Time: 11/19/21  3:55 AM  Result Value Ref Range   Sodium 132 (L) 135 - 145 mmol/L   Potassium 3.5 3.5 - 5.1 mmol/L   Chloride 101 98 - 111 mmol/L   CO2 23 22 - 32 mmol/L   Glucose, Bld 87 70 - 99 mg/dL    Comment: Glucose reference range applies only  to samples taken after fasting for at least 8 hours.   BUN 5 (L) 6 - 20 mg/dL   Creatinine, Ser 0.36 (L) 0.44 - 1.00 mg/dL   Calcium 7.7 (L) 8.9 - 10.3 mg/dL   Total Protein 4.8 (L) 6.5 - 8.1 g/dL   Albumin 2.1 (L) 3.5 - 5.0 g/dL   AST 43 (H) 15 - 41 U/L   ALT 58 (H) 0 - 44 U/L   Alkaline Phosphatase 713 (H) 38 - 126 U/L   Total Bilirubin 1.9 (H) 0.3 - 1.2 mg/dL   GFR, Estimated >60 >60 mL/min    Comment: (NOTE) Calculated using the CKD-EPI Creatinine Equation (2021)    Anion gap 8 5 - 15    Comment: Performed at [facility], [place] [address] [place], [place] [address]  Lipase, blood     Status: None   Collection Time: 11/19/21  3:55 AM  Result Value Ref Range   Lipase 15 11 - 51 U/L    Comment: Performed at [facility], [place] [address], [place], [place] [address]  Magnesium     Status: None   Collection Time: 11/19/21  3:55 AM  Result Value Ref Range   Magnesium 1.7 1.7 - 2.4 mg/dL    Comment: Performed at [facility], [place] [address], [place],  [address]   CT Abdomen Pelvis W Contrast  Result Date: 11/18/2021 CLINICAL DATA:  Postoperative abdominal pain. ERCP today with stent removal and new stent placed. Pancreatic cancer. EXAM: CT ABDOMEN AND PELVIS WITH CONTRAST TECHNIQUE: Multidetector CT imaging of the abdomen and pelvis was performed using the standard protocol following bolus administration of intravenous contrast. RADIATION DOSE REDUCTION: This exam was performed according to the departmental dose-optimization program which includes automated exposure control, adjustment of the mA and/or kV according to patient size and/or use of iterative reconstruction technique. CONTRAST:  60m OMNIPAQUE IOHEXOL 300 MG/ML  SOLN COMPARISON:  CT abdomen and pelvis 10/23/2021. FINDINGS: Lower chest:  No acute abnormality. Hepatobiliary: 13 mm hypodense lesion in the right lobe of the liver has increased in size. New hypodense lesion  in the right lobe of the liver measuring 8 mm image 2/21. New mild periportal edema. Common bile duct stent in place. Small amount of pneumobilia present. Mild gallbladder wall edema. Pancreas: Again seen is fullness of the pancreatic head with mild surrounding inflammatory stranding similar to the prior examination. Pancreatic ductal dilatation in the body and tail with associated atrophy appears unchanged. Spleen: Normal in size without focal abnormality. Adrenals/Urinary Tract: Adrenal glands are unremarkable. Kidneys are normal, without renal calculi, focal lesion, or hydronephrosis. Bladder is unremarkable. Stomach/Bowel: There is questionable wall thickening of the cecum, ascending colon and hepatic flexure versus normal under distension. Appendix appears within normal limits. There is a large amount of stool throughout the colon. No bowel obstruction or free air. Stomach within normal limits. Vascular/Lymphatic: No significant vascular findings are present. No enlarged abdominal or pelvic lymph nodes. Reproductive: Uterus and bilateral adnexa are unremarkable. Other: There is a small amount of ascites which has mildly increased. No abdominal wall hernia. Musculoskeletal: No acute or significant osseous findings. IMPRESSION: 1. Common bile duct stent in place.  Small amount of pneumobilia. 2. New mild periportal edema of uncertain etiology. 3. Questionable mild gallbladder wall edema. Correlate for cholecystitis. 4. Hypodense hepatic lesion has increased in size. Single new small hepatic lesion. Findings are concerning for worsening metastatic disease. 5. Mild wall thickening of the proximal colon worrisome for nonspecific colitis. 6. Small amount of ascites has increased. Electronically Signed   By: Ronney Asters M.D.   On: 11/18/2021 18:46   DG Abd Acute W/Chest  Result Date: 11/18/2021 CLINICAL DATA:  ERCP today.  Pancreatic cancer.  Pain EXAM: DG ABDOMEN ACUTE WITH 1 VIEW CHEST COMPARISON:  CT abdomen  pelvis 10/23/2021 FINDINGS: Lungs are clear without infiltrate or effusion. Negative for heart failure or effusion. Port-A-Cath tip in the SVC Normal bowel gas pattern. No free air. Common bile duct stent in satisfactory position. IMPRESSION: No active cardiopulmonary disease Normal bowel gas pattern. Electronically Signed   By: Franchot Gallo M.D.   On: 11/18/2021 18:33   US Abdomen Limited RUQ (LIVER/GB)  Result Date: 11/18/2021 CLINICAL DATA:  Right upper quadrant pain EXAM: ULTRASOUND ABDOMEN LIMITED RIGHT UPPER QUADRANT COMPARISON:  10/23/2021 CT FINDINGS: Gallbladder: Gallbladder is well distended. Sludge is noted. No cholelithiasis is seen. Common bile duct: Diameter: Dilated to 17 mm with a stent within. Mild intrahepatic biliary ductal dilatation is noted as well. Liver: No focal lesion identified. Within normal limits in parenchymal echogenicity. Previously seen hypodense lesion in the right lobe of the liver is not well appreciated. Portal vein is patent on color Doppler imaging with normal direction of blood flow towards the liver. Other: Mild ascites is noted. IMPRESSION: Gallbladder sludge. Prominent common bile duct with a stent within. No discrete hepatic lesion is noted.  Minimal ascites is seen. Electronically Signed   By: Inez Catalina M.D.   On: 11/18/2021 03:50      Assessment/Plan Pancreatic adenocarcinoma with possible liver mets with abdominal pain, cholangitis  This is a very complex situation and patient.  Her Care everywhere was thoroughly reviewed.  She has just undergone an ERCP yesterday secondary to stent obstruction and cholangitis.  She was discharged with 5 days of oral Cipro but presented to our ED and apparently refused transfer to [facility] with persistent abdominal pain.  Her CT scan is very underwhelming for cholecystitis.  It would not be unexpected to have some appearance of edema of her gallbladder given everything she has going on in this area.  She is currently refusing  her HIDA scan as she is unwilling to go without pain medication.  We would recommend IV abx for treatment at this time, as this still may be related to her cholangitis, her malignancy, or possibly some pancreatitis despite a normal lipase.  If she fails to improve, could consider asking IR for a perc chole tube, but hopeful she won't require this.  We would not recommend a lap chole in the setting of everything she has going on.  She will need to follow up with [facility] after discharge for further care and management.  We will follow while here.   FEN - NPO/IVFs VTE - per medicine, ok from our standpoint ID - [email or web address]  H/o CVA H/o PE, on lovenox at home Tobacco abuse  I reviewed ED provider notes, Consultant   notes, hospitalist notes, last 24 h vitals and pain scores, last 24 h labs and trends, and last 24 h imaging results.  Henreitta Cea, [facility] 11/19/2021, 11:10 AM Please see Amion for pager number during day hours 7:00am-4:30pm or 7:00am -11:30am on weekends

## 2021-11-19 NOTE — Assessment & Plan Note (Addendum)
-  Contiue with Lovenox ?

## 2021-11-19 NOTE — Assessment & Plan Note (Deleted)
Patient with history of pancreatic adenocarcinoma complicated by biliary obstruction requiring stent. ERCP done at [facility] on 11/18/2021 revealed totally occluded CBD stent which was removed.  There was severe biliary stricture in the lower third of the main bile duct which was malignant appearing and treated with placement of a new stent. Ascending cholangitis was present and the biliary tree was swept with removal of pus and sludge.  Soon after discharge she returned to the ED complaining of worsening abdominal pain. ?

## 2021-11-19 NOTE — Assessment & Plan Note (Addendum)
Mild. ?-Continue with IV fluids.  ?

## 2021-11-19 NOTE — Progress Notes (Signed)
?Progress Note ? ? ?Patient: Cynthia [name] DOB: 1981/02/23 DOA: 11/18/2021     0 ?DOS: the patient was seen and examined on 11/19/2021 ?  ?Brief hospital course: ?41 year old past medical history significant for pancreatic adenocarcinoma currently on chemotherapy complicated by biliary obstruction requiring stents, PE on Lovenox, pancreatitis.  Recently admitted 10/23/2021 for abdominal pain and treated for acute pancreatitis.  Patient had ERCP done at [facility]  the day of admission which revealed totally occluded CBD stent which was removed.  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  And treated with placement of a new a stent.  Ascending cholangitis was present and the biliary tree was swept with removal of both stone and sludge.  Patient was discharged with 5 days course of ciprofloxacin.  She presents to the ED complaining of worsening pain. ? ?CT showed common bile duct stent in place.  Small amount of pneumobilia.  New mild periportal edema of uncertain  etiology.  Questionable mild gallbladder wall edema.  Hypodense hepatic lesion has increased in size and single ?Small hepatic lesion finding concerning for worsening metastatic disease.  Mild wall thickening of the proximal colon worrisome for nonspecific colitis. ? ?GI and surgery has been consulted. ? ? ? ?Assessment and Plan: ?* Epigastric abdominal pain ?-Patient with history of pancreatic adenocarcinoma complicated by biliary obstruction requiring stent. ERCP done at [facility] on 11/18/2021 revealed totally occluded CBD stent which was removed.  There was severe biliary stricture in the lower third of the main bile duct which was malignant appearing and treated with placement of a new stent. Ascending cholangitis was present and the biliary tree was swept with removal of pus and sludge.   ?-CT done after ERCP showing common bile duct stent in place. Questionable mild gallbladder wall edema.   Hypodense hepatic lesion has increased in size and single new small hepatic lesion, findings concerning for worsening metastatic disease.  ?-GI consulted and recommended HIDA scan initially. Patient refuse HIDA scan does not want to be off pain meds. ?-Surgery consulted, recommended continue with IV antibiotics. If no improvement might need cholecystostomy tube.  ?-Dilaudid as needed for pain ?-IV fluid hydration ?-Blood culture no growth to date.  ?-Monitor lipase and LFTs ?-IV protonix. IV Zosyn.  ? ? ?Hyponatremia ?Mild. ?-Continue with IV fluids.  ? ?Hypokalemia ?Hypomagnesemia. ?Replaced.  ?Repeat labs in am.  ? ? ? ?Pancreatic adenocarcinoma ([place]) ?Follows at [facility] and currently on chemotherapy.  CT showing findings concerning for worsening metastatic disease. ?-Outpatient oncology follow-up ? ?History of pulmonary embolism ?-Resume Lovenox, home dose ? ? ? ? ?  ? ?Subjective: she is complaining of abdominal pain, epigastric radiates to back.  ? ? ?Physical Exam: ?Vitals:  ? 11/19/21 0326 11/19/21 0626 11/19/21 0922 11/19/21 1254  ?BP: 117/76 (!) 145/79 (!) 144/65 (!) 153/84  ?Pulse: (!) 59 60 (!) 58 (!) 55  ?Resp: '16 18 16 18  '$ ?Temp: 99.3 ?F (37.4 ?C) 98.5 ?F (36.9 ?C) 98.8 ?F (37.1 ?C) 98.9 ?F (37.2 ?C)  ?TempSrc: Oral Oral  Oral  ?SpO2: 100% 99% 99% 100%  ?Weight:      ?Height:      ? ?General; NAD ?CVS; S 1, S 2   RRR ?Lung; CTA ?Abdomen; soft , tender, no rigidity.  ?Extremity; no edema/  ? ?Data Reviewed: ? ?Cmet and CBC reviewed.  ? ?Family Communication: Care discussed patient.  ? ?Disposition: ?Status is: Inpatient ?Remains inpatient appropriate because: required  IV fluid, antibiotic.  ? Planned Discharge Destination: Home ? ? ? ?Time spent: 45 minutes ? ?Author: ?Elmarie Shiley, MD ?11/19/2021 3:00 PM ? ?For on call review [email or web address].  ?

## 2021-11-19 NOTE — Assessment & Plan Note (Addendum)
Hypomagnesemia; mg 1.6 replete IV Potassium replaced.

## 2021-11-19 NOTE — Progress Notes (Signed)
ANTICOAGULATION CONSULT NOTE - Initial Consult ? ?Pharmacy Consult for enoxaparin ?Indication: pulmonary embolus ? ?No Known Allergies ? ?Patient Measurements: ?Height: '5\' 5"'$  (165.1 cm) ?Weight: 52.6 kg (116 lb) ?IBW/kg (Calculated) : 57 ? ?Vital Signs: ?Temp: 98.9 ?F (37.2 ?C) (03/08 1254) ?Temp Source: Oral (03/08 1254) ?BP: 153/84 (03/08 1254) ?Pulse Rate: 55 (03/08 1254) ? ?Labs: ?Recent Labs  ?  11/18/21 ?0139 11/18/21 ?1517 11/19/21 ?0355  ?HGB 9.6* 10.2* 8.9*  ?HCT 26.4* 29.2* 25.7*  ?PLT 361 416* 366  ?CREATININE <0.30* 0.33* 0.36*  ? ? ?Estimated Creatinine Clearance: 79.2 mL/min (A) (by C-G formula based on SCr of 0.36 mg/dL (L)). ? ? ?Medical History: ?Past Medical History:  ?Diagnosis Date  ? CVA (cerebral vascular accident) [facility]) 2012  ? s/p c-section  ? History of pulmonary embolism   ? Normal pregnancy in multigravida in third trimester 07/18/2018  ? Pancreatic cancer ([place])   ? Pancreatitis, acute 12/04/2020  ? Tobacco abuse   ? ? ?Medications: Patient is prescribed enoxaparin 80 mg (1.5 mg/kg) subcutaneously once daily PTA.  ? ?Assessment: ?Pt is a 27 yoF with PMH significant for pancreatic adenocarcinoma. Pt underwent ERCP on 3/7 (yesterday) at [facility] - old occluded stent was removed and new stent was placed. Pt was discharged after procedure and presented to [facility] ED last night with abdominal pain.  ? ?Pt is on enoxaparin prior to admission for history of PE. Pharmacy consulted for enoxaparin dosing.  ? ?Discussed dosing options with Dr. Tyrell Antonio - will do 1 mg/kg subQ q12h while inpatient. Discussed timing of enoxaparin initiation s/p ERCP with Dr. Tyrell Antonio and Dr. Ardis Hughs. Enoxaparin to start 3/9 @ 0800.  ? ?Today, 11/19/21 ?-SCr low. CrCl > 30 mL/min ?-Hgb low and decreased, Plt WNL ?-TBW = 52 kg ? ?Goal of Therapy:  ?Monitor platelets by anticoagulation protocol: Yes ?  ?Plan:  ?Enoxaparin 1 mg/kg subQ q12h starting @ 0800 tomorrow morning ?CBC/SCr with AM labs tomorrow ?Monitor for signs of  bleeding ? ?Lenis Noon, PharmD ?11/19/2021,3:47 PM ?

## 2021-11-19 NOTE — Hospital Course (Addendum)
41 year old past medical history significant for pancreatic adenocarcinoma currently on chemotherapy complicated by biliary obstruction requiring stents, PE on Lovenox, pancreatitis.  Recently admitted 10/23/2021 for abdominal pain and treated for acute pancreatitis.  Patient had ERCP done at [facility]  the day of admission which revealed totally occluded CBD stent which was removed.  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  And treated with placement of a new a stent.  Ascending cholangitis was present and the biliary tree was swept with removal of both stone and sludge.  Patient was discharged with 5 days course of ciprofloxacin.  She presents to the ED complaining of worsening pain.  CT showed common bile duct stent in place.  Small amount of pneumobilia.  New mild periportal edema of uncertain  etiology.  Questionable mild gallbladder wall edema.  Hypodense hepatic lesion has increased in size and single Small hepatic lesion finding concerning for worsening metastatic disease.  Mild wall thickening of the proximal colon worrisome for nonspecific colitis.  Underwent ERCP 3/10, scope couldn't past distal duodenal bulb.  Case was discussed with patient primary Oncology at [facility], he think patient pain is related to malignancy, not related to Gallbladder or stent.   Palliative care consulted. MS contin increase to 60 mg Q 8 hours, Oral dilaudid, transition to IV fentanyl.

## 2021-11-19 NOTE — Plan of Care (Signed)

## 2021-11-19 NOTE — Consult Note (Signed)
[facility] Referring Provider: Dr. Marlowe Sax Primary Care Physician:  Clinic, [facility] Primary Gastroenterologist:  Dr. Rush Landmark locally, [facility] as well  Reason for Consultation: Abdominal pain  HPI:  Cynthia [name] is a 41 y.o. female  who was diagnosed with pancreatic adenocarcinoma 11/2020. She is under the care of [facility], [place] [facility].  She was originally felt to have potentially resectable disease, underwent neoadjuvant chemotherapy.  According to oncology office note last week they were concerned about 2 new low density lesion in her liver, possibly sites of metastasis and so they ordered an MRI which was done last week and unfortunately could not clearly name the 2 liver lesions. Her LFTs were rising, she was having abdominal pains and so she underwent ERCP yesterday at [facility]. Her previous covered metal stent was felt to be occluded and it was removed, the bile duct was swept several times revealing pus, a new fully covered stent was placed. Her pains (epigastric and back) have worsened after the ERCP and so she returned to [facility] ER. She is requiring IV narcotic pain meds every 2-3 hours currently.   LFTs are better today than before the ERCP at [facility] yesterday. She does not have a WBC. Lipase is normal. CT shows the stent is in good position. Neither [place] nor CT suggest cholecystitis.   Past Medical History:  Diagnosis Date   History of pulmonary embolism    Normal pregnancy in multigravida in third trimester 07/18/2018   Pancreatic cancer ([place])    Pancreatitis, acute 12/04/2020   Tobacco abuse     Past Surgical History:  Procedure Laterality Date   BIOPSY  12/02/2020   Procedure: BIOPSY;  Surgeon: Rush Landmark Telford Nab., MD;  Location: [place];  Service: Gastroenterology;;   [place] N/A 02/27/2015   Procedure: CESAREAN SECTION;  Surgeon: Newton Pigg, MD;  Location: [place] [facility];  Service: Obstetrics;  Laterality: N/A;   CESAREAN  SECTION N/A 08/03/2018   Procedure: REPEAT CESAREAN SECTION;  Surgeon: Janyth Contes, MD;  Location: [place];  Service: Obstetrics;  Laterality: N/ANira Conn, RNFA   ERCP N/A 12/02/2020   Procedure: ENDOSCOPIC RETROGRADE CHOLANGIOPANCREATOGRAPHY (ERCP);  Surgeon: Irving Copas., MD;  Location: [place];  Service: Gastroenterology;  Laterality: N/A;   ESOPHAGOGASTRODUODENOSCOPY N/A 12/02/2020   Procedure: ESOPHAGOGASTRODUODENOSCOPY (EGD);  Surgeon: Irving Copas., MD;  Location: [place];  Service: Gastroenterology;  Laterality: N/A;   FINE NEEDLE ASPIRATION  12/02/2020   Procedure: FINE NEEDLE ASPIRATION (FNA) LINEAR;  Surgeon: Irving Copas., MD;  Location: [place];  Service: Gastroenterology;;   UPPER ESOPHAGEAL ENDOSCOPIC ULTRASOUND (EUS) N/A 12/02/2020   Procedure: UPPER ESOPHAGEAL ENDOSCOPIC ULTRASOUND (EUS);  Surgeon: Irving Copas., MD;  Location: [place];  Service: Gastroenterology;  Laterality: N/A;    Prior to Admission medications   Medication Sig Start Date End Date Taking? Authorizing Provider  dicyclomine (BENTYL) 20 MG tablet Take 20 mg by mouth 2 (two) times daily as needed for spasms.   Yes [provider]  enoxaparin (LOVENOX) 80 MG/0.8ML injection Inject 80 mg into the skin daily. 05/28/21  Yes [provider]  HYDROmorphone (DILAUDID) 4 MG tablet Take 2-4 mg by mouth every 4 (four) hours as needed for moderate pain or severe pain. 10/28/21  Yes [provider]  ibuprofen (ADVIL) 200 MG tablet Take 400 mg by mouth every 6 (six) hours as needed for mild pain.   Yes [provider]  morphine (MS CONTIN)  30 MG 12 hr tablet Take 30 mg by mouth every 8 (eight) hours as needed for pain. 10/30/21 12/25/21 Yes [provider]  ondansetron (ZOFRAN-ODT) 8 MG disintegrating tablet Take 8 mg by mouth every 8 (eight) hours as needed for nausea. 07/16/21  Yes [provider]   prochlorperazine (COMPAZINE) 10 MG tablet Take 10 mg by mouth every 6 (six) hours as needed for nausea. 07/17/21  Yes [provider]  diazepam (VALIUM) 5 MG tablet Take 1 tablet (5 mg total) by mouth every 8 (eight) hours as needed for muscle spasms. Patient not taking: Reported on 11/18/2021 02/13/21 02/13/22  Fransico Meadow, PA-C  docusate sodium (COLACE) 100 MG capsule Take 1 capsule (100 mg total) by mouth 2 (two) times daily. Patient not taking: Reported on 11/18/2021 12/05/20   Lavina Hamman, MD  lidocaine-prilocaine (EMLA) cream Apply 1 application topically as needed (for port access). 05/23/21   [provider]    Current Facility-Administered Medications  Medication Dose Route Frequency Provider Last Rate Last Admin   0.9 %  sodium chloride infusion   Intravenous Continuous Shela Leff, MD 125 mL/hr at 11/19/21 0010 New Bag at 11/19/21 0010   feeding supplement (ENSURE ENLIVE / ENSURE PLUS) liquid 237 mL  237 mL Oral BID BM Shela Leff, MD       HYDROmorphone (DILAUDID) injection 1 mg  1 mg Intravenous Q3H PRN Kathryne Eriksson, NP   1 mg at 11/19/21 0533   piperacillin-tazobactam (ZOSYN) IVPB 3.375 g  3.375 g Intravenous Quay Burow, MD 12.5 mL/hr at 11/19/21 0426 3.375 g at 11/19/21 0426    Allergies as of 11/18/2021   (No Known Allergies)    Family History  Problem Relation Age of Onset   Hypertension Mother    Hypertension Father    Gastric cancer Maternal Grandmother        dx unknown age   Lung cancer Paternal Grandfather        dx after 55; smoking hx    Social History   Socioeconomic History   Marital status: Married    Spouse name: Not on file   Number of children: 8   Years of education: Not on file   Highest education level: Not on file  Occupational History   Occupation: Biomedical scientist   Tobacco Use   Smoking status: Every Day    Packs/day: 0.50    Years: 13.00    Pack years: 6.50    Types: Cigarettes   Smokeless  tobacco: Never  Vaping Use   Vaping Use: Never used  Substance and Sexual Activity   Alcohol use: No    Alcohol/week: 0.0 standard drinks   Drug use: No   Sexual activity: Yes  Other Topics Concern   Not on file  Social History Narrative   Not on file   Social Determinants of Health   Financial Resource Strain: Not on file  Food Insecurity: Not on file  Transportation Needs: Not on file  Physical Activity: Not on file  Stress: Not on file  Social Connections: Not on file  Intimate Partner Violence: Not on file   Review of Systems: Pertinent positive and negative review of systems were noted in the above HPI section. Complete review of systems was performed and was otherwise normal.  Physical Exam: Vital signs in last 24 hours: Temp:  [98.2 F (36.8 C)-99.3 F (37.4 C)] 98.5 F (36.9 C) (03/08 0626) Pulse Rate:  [44-78] 60 (03/08 0626) Resp:  [15-29]  18 (03/08 0626) BP: (117-166)/(75-107) 145/79 (03/08 0626) SpO2:  [99 %-100 %] 99 % (03/08 0626) Weight:  [52.6 kg] 52.6 kg (03/07 1421) Last BM Date : 11/17/21 Constitutional: generally well-appearing Psychiatric: alert and oriented x3 Eyes: extraocular movements intact Mouth: oral pharynx moist, no lesions Neck: supple no lymphadenopathy Cardiovascular: heart regular rate and rhythm Lungs: clear to auscultation bilaterally Abdomen: moderately tender throughout epig, no peritoneal signs. Extremities: no lower extremity edema bilaterally Skin: no lesions on visible extremities  Lab Results: Recent Labs    11/18/21 0139 11/18/21 1517 11/19/21 0355  WBC 11.8* 11.2* 10.4  HGB 9.6* 10.2* 8.9*  HCT 26.4* 29.2* 25.7*  PLT 361 416* 366  MCV 79.8* 80.9 81.1   BMET Recent Labs    11/18/21 0139 11/18/21 1517 11/19/21 0355  NA 133* 131* 132*  K 3.2* 3.4* 3.5  CL 102 98 101  CO2 '26 28 23  '$ GLUCOSE 97 102* 87  BUN 5* 5* 5*  CREATININE <0.30* 0.33* 0.36*  CALCIUM 7.9* 8.2* 7.7*   LFT Recent Labs     11/19/21 0355  BILITOT 1.9*  AST 43*  ALT 58*  ALKPHOS 713*  PROT 4.8*  ALBUMIN 2.1*    Imaging/Other Results: CT Abdomen Pelvis W Contrast  Result Date: 11/18/2021 CLINICAL DATA:  Postoperative abdominal pain. ERCP today with stent removal and new stent placed. Pancreatic cancer. EXAM: CT ABDOMEN AND PELVIS WITH CONTRAST TECHNIQUE: Multidetector CT imaging of the abdomen and pelvis was performed using the standard protocol following bolus administration of intravenous contrast. RADIATION DOSE REDUCTION: This exam was performed according to the departmental dose-optimization program which includes automated exposure control, adjustment of the mA and/or kV according to patient size and/or use of iterative reconstruction technique. CONTRAST:  54m OMNIPAQUE IOHEXOL 300 MG/ML  SOLN COMPARISON:  CT abdomen and pelvis 10/23/2021. FINDINGS: Lower chest: No acute abnormality. Hepatobiliary: 13 mm hypodense lesion in the right lobe of the liver has increased in size. New hypodense lesion in the right lobe of the liver measuring 8 mm image 2/21. New mild periportal edema. Common bile duct stent in place. Small amount of pneumobilia present. Mild gallbladder wall edema. Pancreas: Again seen is fullness of the pancreatic head with mild surrounding inflammatory stranding similar to the prior examination. Pancreatic ductal dilatation in the body and tail with associated atrophy appears unchanged. Spleen: Normal in size without focal abnormality. Adrenals/Urinary Tract: Adrenal glands are unremarkable. Kidneys are normal, without renal calculi, focal lesion, or hydronephrosis. Bladder is unremarkable. Stomach/Bowel: There is questionable wall thickening of the cecum, ascending colon and hepatic flexure versus normal under distension. Appendix appears within normal limits. There is a large amount of stool throughout the colon. No bowel obstruction or free air. Stomach within normal limits. Vascular/Lymphatic: No  significant vascular findings are present. No enlarged abdominal or pelvic lymph nodes. Reproductive: Uterus and bilateral adnexa are unremarkable. Other: There is a small amount of ascites which has mildly increased. No abdominal wall hernia. Musculoskeletal: No acute or significant osseous findings. IMPRESSION: 1. Common bile duct stent in place.  Small amount of pneumobilia. 2. New mild periportal edema of uncertain etiology. 3. Questionable mild gallbladder wall edema. Correlate for cholecystitis. 4. Hypodense hepatic lesion has increased in size. Single new small hepatic lesion. Findings are concerning for worsening metastatic disease. 5. Mild wall thickening of the proximal colon worrisome for nonspecific colitis. 6. Small amount of ascites has increased. Electronically Signed   By: ARonney AstersM.D.   On: 11/18/2021 18:46  DG Abd Acute W/Chest  Result Date: 11/18/2021 CLINICAL DATA:  ERCP today.  Pancreatic cancer.  Pain EXAM: DG ABDOMEN ACUTE WITH 1 VIEW CHEST COMPARISON:  CT abdomen pelvis 10/23/2021 FINDINGS: Lungs are clear without infiltrate or effusion. Negative for heart failure or effusion. Port-A-Cath tip in the SVC Normal bowel gas pattern. No free air. Common bile duct stent in satisfactory position. IMPRESSION: No active cardiopulmonary disease Normal bowel gas pattern. Electronically Signed   By: Franchot Gallo M.D.   On: 11/18/2021 18:33   US Abdomen Limited RUQ (LIVER/GB)  Result Date: 11/18/2021 CLINICAL DATA:  Right upper quadrant pain EXAM: ULTRASOUND ABDOMEN LIMITED RIGHT UPPER QUADRANT COMPARISON:  10/23/2021 CT FINDINGS: Gallbladder: Gallbladder is well distended. Sludge is noted. No cholelithiasis is seen. Common bile duct: Diameter: Dilated to 17 mm with a stent within. Mild intrahepatic biliary ductal dilatation is noted as well. Liver: No focal lesion identified. Within normal limits in parenchymal echogenicity. Previously seen hypodense lesion in the right lobe of the liver  is not well appreciated. Portal vein is patent on color Doppler imaging with normal direction of blood flow towards the liver. Other: Mild ascites is noted. IMPRESSION: Gallbladder sludge. Prominent common bile duct with a stent within. No discrete hepatic lesion is noted.  Minimal ascites is seen. Electronically Signed   By: Inez Catalina M.D.   On: 11/18/2021 03:50      Impression/Plan: 41 y.o. female with known pancreatic adenocarcinoma, s/p ERCP yesterday at [facility]. Admitted with worsening of abd/back pains.  First, her LFTs are much improved this morning and so I think the new, covered metal stent placed yesterday at [facility] is functioning well, decompressing her biliary obstruction. Given purulent cholangitis noted yesterday we should continue IV antibiotics for now; Currently on Zosyn IV.  Second, her pains (back and abd) were present before her ERCP yesterday and are clearly worse since then. Unclear etiology. Perhaps related to the cholangitis? Perhaps pancreatitis despite normal lipase, CT last night does show some peripancreatic inflammation but it is unchanged from a month ago.  Perhaps this is pain from the cancer?  Perhaps this is pain from her GB; [place] yesterday showed no evidence of cholecystitis but radiologist did write about "questionable mild galllbadder wall edema" on CT.  A HIDA scan has been ordered. I am going to ask that [facility] consult on her as well.  Third, one of the hypodensities in her liver has increased in size vs CT 1 month ago.  Possibly she has metastatic disease now.  Fourth, repeat urine pregnancy test was negative yesterday (was positive earlier this week).  For now, pain control, await HIDA results. We will ask [facility] to consult. Continue IV antibiotics, IV fluids.  [name], MD  11/19/2021, 7:42 AM [facility] Pager [phone]

## 2021-11-20 ENCOUNTER — Inpatient Hospital Stay (HOSPITAL_COMMUNITY): Payer: Medicaid Other

## 2021-11-20 DIAGNOSIS — R1013 Epigastric pain: Secondary | ICD-10-CM | POA: Diagnosis not present

## 2021-11-20 DIAGNOSIS — E43 Unspecified severe protein-calorie malnutrition: Secondary | ICD-10-CM | POA: Insufficient documentation

## 2021-11-20 LAB — CBC
HCT: 23.2 % — ABNORMAL LOW (ref 36.0–46.0)
Hemoglobin: 8.1 g/dL — ABNORMAL LOW (ref 12.0–15.0)
MCH: 27.8 pg (ref 26.0–34.0)
MCHC: 34.9 g/dL (ref 30.0–36.0)
MCV: 79.7 fL — ABNORMAL LOW (ref 80.0–100.0)
Platelets: 361 10*3/uL (ref 150–400)
RBC: 2.91 MIL/uL — ABNORMAL LOW (ref 3.87–5.11)
RDW: 13.5 % (ref 11.5–15.5)
WBC: 6.8 10*3/uL (ref 4.0–10.5)
nRBC: 0 % (ref 0.0–0.2)

## 2021-11-20 LAB — COMPREHENSIVE METABOLIC PANEL
ALT: 47 U/L — ABNORMAL HIGH (ref 0–44)
AST: 30 U/L (ref 15–41)
Albumin: 2.1 g/dL — ABNORMAL LOW (ref 3.5–5.0)
Alkaline Phosphatase: 617 U/L — ABNORMAL HIGH (ref 38–126)
Anion gap: 7 (ref 5–15)
BUN: <5 mg/dL — ABNORMAL LOW (ref 6–20)
CO2: 26 mmol/L (ref 22–32)
Calcium: 7.8 mg/dL — ABNORMAL LOW (ref 8.9–10.3)
Chloride: 101 mmol/L (ref 98–111)
Creatinine, Ser: 0.37 mg/dL — ABNORMAL LOW (ref 0.44–1.00)
GFR, Estimated: >60 mL/min (ref >60)
Glucose, Bld: 102 mg/dL — ABNORMAL HIGH (ref 70–99)
Potassium: 2.9 mmol/L — ABNORMAL LOW (ref 3.5–5.1)
Sodium: 134 mmol/L — ABNORMAL LOW (ref 135–145)
Total Bilirubin: 1.4 mg/dL — ABNORMAL HIGH (ref 0.3–1.2)
Total Protein: 5.1 g/dL — ABNORMAL LOW (ref 6.5–8.1)

## 2021-11-20 LAB — MAGNESIUM: Magnesium: 1.6 mg/dL — ABNORMAL LOW (ref 1.7–2.4)

## 2021-11-20 MED ORDER — SODIUM CHLORIDE 0.9% FLUSH: 10.0000 mL | INTRAVENOUS | Status: DC | PRN

## 2021-11-20 MED ORDER — LACTATED RINGERS IV SOLN
INTRAVENOUS | Status: DC
Start: 2021-11-20 — End: 2021-11-26
  Filled 2021-11-20 (×16): qty 1000

## 2021-11-20 MED ORDER — POTASSIUM CHLORIDE 10 MEQ/100ML IV SOLN
10.0000 meq | INTRAVENOUS | Status: AC
  Administered 2021-11-20 (×2): 10 meq via INTRAVENOUS
  Filled 2021-11-20 (×2): qty 100

## 2021-11-20 MED ORDER — CHLORHEXIDINE GLUCONATE CLOTH 2 % EX PADS
6.0000 | MEDICATED_PAD | Freq: Every day | CUTANEOUS | Status: DC
  Administered 2021-11-20 – 2021-11-26 (×7): 6 via TOPICAL

## 2021-11-20 MED ORDER — ONDANSETRON HCL 4 MG/2ML IJ SOLN
4.0000 mg | Freq: Once | INTRAMUSCULAR | Status: AC
  Administered 2021-11-20: 03:00:00 4 mg via INTRAVENOUS
  Filled 2021-11-20: qty 2

## 2021-11-20 MED ORDER — MORPHINE SULFATE ER 30 MG PO TBCR
30.0000 mg | EXTENDED_RELEASE_TABLET | Freq: Three times a day (TID) | ORAL | Status: DC
  Administered 2021-11-20 – 2021-11-21 (×5): 30 mg via ORAL
  Filled 2021-11-20 (×5): qty 1

## 2021-11-20 MED ORDER — HYDROMORPHONE HCL 1 MG/ML IJ SOLN
1.0000 mg | INTRAMUSCULAR | Status: DC | PRN
  Administered 2021-11-20 – 2021-11-21 (×6): 1 mg via INTRAVENOUS
  Filled 2021-11-20 (×7): qty 1

## 2021-11-20 MED ORDER — MAGNESIUM SULFATE 2 GM/50ML IV SOLN
2.0000 g | Freq: Once | INTRAVENOUS | Status: AC
  Administered 2021-11-20: 10:00:00 2 g via INTRAVENOUS
  Filled 2021-11-20: qty 50

## 2021-11-20 MED ORDER — SODIUM CHLORIDE 0.9% FLUSH
10.0000 mL | Freq: Two times a day (BID) | INTRAVENOUS | Status: DC
  Administered 2021-11-20 – 2021-11-25 (×8): 10 mL

## 2021-11-20 MED ORDER — MAGNESIUM SULFATE 2 GM/50ML IV SOLN
2.0000 g | Freq: Once | INTRAVENOUS | Status: AC
  Administered 2021-11-20: 14:00:00 2 g via INTRAVENOUS
  Filled 2021-11-20: qty 50

## 2021-11-20 MED ORDER — KCL-LACTATED RINGERS 20 MEQ/L IV SOLN
INTRAVENOUS | Status: DC
  Filled 2021-11-20: qty 1000

## 2021-11-20 MED ORDER — POTASSIUM CHLORIDE 10 MEQ/100ML IV SOLN
10.0000 meq | INTRAVENOUS | Status: AC
  Administered 2021-11-20 (×3): 10 meq via INTRAVENOUS
  Filled 2021-11-20 (×3): qty 100

## 2021-11-20 NOTE — Progress Notes (Signed)
Initial Nutrition Assessment ? ?DOCUMENTATION CODES:  ? ?Severe malnutrition in context of chronic illness ? ?INTERVENTION:  ?- diet advancement as medically feasible.  ?- will provide nutrition interventions with diet advancement and further determination of treatment course during hospitalization. ? ?- will check serum vitamins A, D, and K, and zinc d/t cancer dx.  ?- RD will reach out to PCP with results if patient discharges before labs result.  ? ? ?NUTRITION DIAGNOSIS:  ? ?Severe Malnutrition related to chronic illness, cancer and cancer related treatments as evidenced by severe fat depletion, severe muscle depletion, percent weight loss. ? ?GOAL:  ? ?Patient will meet greater than or equal to 90% of their needs ? ?MONITOR:  ? ?Diet advancement, Labs, Weight trends ? ?REASON FOR ASSESSMENT:  ? ?Malnutrition Screening Tool ? ?ASSESSMENT:  ? ?41 year old past medical history significant for pancreatic adenocarcinoma currently on chemotherapy complicated by biliary obstruction requiring stents, PE on Lovenox, pancreatitis, tobacco abuse, and CVA. She was admitted on 2/9 due to abdominal pain and treatment for acute pancreatitis. She had ERCP and stent exchange at [facility] two days PTA. At [facility] she was noted to have totally occluded CBD stent which required removal and replacement. 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 She presented to the ED due to worsening abdominal pain (admitted for the same). She was noted to have hepatic lesion which had increased in size and is concerning for metastatic disease and mild wall thickening for colon concerning for colitis. ? ?Patient laying in bed sleeping. No visitors present at the time of RD visit. Patient awoke to name call x2 but remained with eyes closed and slightly nodded or shock head to respond to questions. Head movements to respond became more subtle with each question so visit and NFPE very limited to allow patient to rest at this  time. ? ?She has been NPO since admission. She confirms that her care is primarily provided at [facility]. She denies seeing/working with a dietitian there. She did vocalize to share that last PO intake was on Sunday (3/5) and that she ate a very small amount that date.  ? ?Surgery and GI following. Surgery's note from this AM states that patient reported abdominal pain x1-2 weeks with intermittent nausea and no episodes of emesis. Plan to repeat ultrasound with possible ERCP  and stent exchange vs perc cholecystostomy tube placement. ? ?Weight on 3/7 was 116 lb and weight on 08/09/21 was 145 lb. This indicates 29 lb weight loss (20% body weight) in the past 3.5 months; significant for time frame.  ? ? ?Labs reviewed; Na: 134 mmol/l, K: 2.9 mmol/l, BUN: <5 mg/dl, creatinine: 0.37 mg/dl, Ca: 7.8 mg/dl, Mg: 1.6 mg/dl, Alk Phos elevated but trending down, lipase WDL. ? ?Medications reviewed; 2 Mg IV x1 run 3/8 and x1 run 3/9, 40 mg IV protonix BID, 10 mEq IV KCl x3 runs 3/9. ? ?IVF; LR-20 mEq KCl @ 75 ml/hr.  ? ? ?NUTRITION - FOCUSED PHYSICAL EXAM: ? ?Flowsheet Row Most Recent Value  ?Orbital Region Severe depletion  ?Upper Arm Region Severe depletion  ?Thoracic and Lumbar Region Unable to assess  ?Buccal Region Moderate depletion  ?Temple Region Moderate depletion  ?Clavicle Bone Region Severe depletion  ?Clavicle and Acromion Bone Region Severe depletion  ?Scapular Bone Region Unable to assess  ?Dorsal Hand Moderate depletion  ?Patellar Region Unable to assess  ?Anterior Thigh Region Unable to assess  ?Posterior Calf Region Unable to assess  ?Edema (RD Assessment) Unable to  assess  ?Hair Unable to assess  ?Eyes Unable to assess  ?Mouth Unable to assess  ?Skin Unable to assess  ?Nails Reviewed  ? ?  ? ? ?Diet Order:   ?Diet Order   ? ?       ?  Diet NPO time specified Except for: Sips with Meds  Diet effective now       ?  ? ?  ?  ? ?  ? ? ?EDUCATION NEEDS:  ? ?Not appropriate for education at this time ? ?Skin:  Skin  Assessment: Reviewed RN Assessment ? ?Last BM:  PTA/unknown ? ?Height:  ? ?Ht Readings from Last 1 Encounters:  ?11/18/21 5' 5" (1.651 m)  ? ? ?Weight:  ? ?Wt Readings from Last 1 Encounters:  ?11/18/21 52.6 kg  ? ? ? ?BMI:  Body mass index is 19.3 kg/m?. ? ?Estimated Nutritional Needs:  ?Kcal:  2100-2300 kcal ?Protein:  105-120 grams ?Fluid:  >/= 2.3 L/day ? ? ? ? ?Yekaterina Ostheim, MS, RD, LDN ?Inpatient Clinical Dietitian ?RD pager # available in AMION  ?After hours/weekend pager # available in AMION ? ?

## 2021-11-20 NOTE — Progress Notes (Signed)
Patient ID: Cynthia [name], female   DOB: 1980-12-23, 41 y.o.   MRN: [id number] ?[facility] ?Progress Note ? ?   ?Subjective: ?CC-  ?Continues to have epigastric/ RUQ pain radiating in her back. It is unchanged from yesterday. States that she has had abdominal pain for a long time, but this pain has been present for 1-2 weeks. Some nausea at times, no emesis. Feels hungry. ?WBC WNL, afebrile. LFTs trending down. ?States that she cannot tolerate HIDA scan. ? ?Objective: ?Vital signs in last 24 hours: ?Temp:  [98.1 ?F (36.7 ?C)-98.9 ?F (37.2 ?C)] 98.1 ?F (36.7 ?C) (03/09 0534) ?Pulse Rate:  [54-55] 54 (03/09 0534) ?Resp:  [18] 18 (03/09 0534) ?BP: (153-162)/(84-88) 162/88 (03/09 0534) ?SpO2:  [100 %] 100 % (03/08 1254) ?Last BM Date : 11/19/21 ? ?Intake/Output from previous day: ?03/08 0701 - 03/09 0700 ?In: -  ?Out: 400 [Urine:400] ?Intake/Output this shift: ?No intake/output data recorded. ? ?PE: ?Gen:  Alert, NAD ?Abd: Soft, ND, mild TTP epigastric and RUQ ? ?Lab Results:  ?Recent Labs  ?  11/19/21 ?7209 11/20/21 ?0352  ?WBC 10.4 6.8  ?HGB 8.9* 8.1*  ?HCT 25.7* 23.2*  ?PLT 366 361  ? ?BMET ?Recent Labs  ?  11/19/21 ?0355 11/20/21 ?0352  ?NA 132* 134*  ?K 3.5 2.9*  ?CL 101 101  ?CO2 23 26  ?GLUCOSE 87 102*  ?BUN 5* <5*  ?CREATININE 0.36* 0.37*  ?CALCIUM 7.7* 7.8*  ? ?PT/INR ?No results for input(s): LABPROT, INR in the last 72 hours. ?CMP  ?   ?Component Value Date/Time  ? NA 134 (L) 11/20/2021 0352  ? K 2.9 (L) 11/20/2021 0352  ? CL 101 11/20/2021 0352  ? CO2 26 11/20/2021 0352  ? GLUCOSE 102 (H) 11/20/2021 0352  ? BUN <5 (L) 11/20/2021 0352  ? CREATININE 0.37 (L) 11/20/2021 0352  ? CALCIUM 7.8 (L) 11/20/2021 0352  ? PROT 5.1 (L) 11/20/2021 0352  ? ALBUMIN 2.1 (L) 11/20/2021 0352  ? AST 30 11/20/2021 0352  ? ALT 47 (H) 11/20/2021 0352  ? ALKPHOS 617 (H) 11/20/2021 0352  ? BILITOT 1.4 (H) 11/20/2021 0352  ? GFRNONAA >60 11/20/2021 0352  ? GFRAA >60 05/27/2015 1500  ? ?Lipase  ?   ?Component Value  Date/Time  ? LIPASE 15 11/19/2021 0355  ? ? ? ? ? ?Studies/Results: ?CT Abdomen Pelvis W Contrast ? ?Result Date: 11/18/2021 ?CLINICAL DATA:  Postoperative abdominal pain. ERCP today with stent removal and new stent placed. Pancreatic cancer. EXAM: CT ABDOMEN AND PELVIS WITH CONTRAST TECHNIQUE: Multidetector CT imaging of the abdomen and pelvis was performed using the standard protocol following bolus administration of intravenous contrast. RADIATION DOSE REDUCTION: This exam was performed according to the departmental dose-optimization program which includes automated exposure control, adjustment of the mA and/or kV according to patient size and/or use of iterative reconstruction technique. CONTRAST:  95m OMNIPAQUE IOHEXOL 300 MG/ML  SOLN COMPARISON:  CT abdomen and pelvis 10/23/2021. FINDINGS: Lower chest: No acute abnormality. Hepatobiliary: 13 mm hypodense lesion in the right lobe of the liver has increased in size. New hypodense lesion in the right lobe of the liver measuring 8 mm image 2/21. New mild periportal edema. Common bile duct stent in place. Small amount of pneumobilia present. Mild gallbladder wall edema. Pancreas: Again seen is fullness of the pancreatic head with mild surrounding inflammatory stranding similar to the prior examination. Pancreatic ductal dilatation in the body and tail with associated atrophy appears unchanged. Spleen: Normal in size without focal abnormality.  Adrenals/Urinary Tract: Adrenal glands are unremarkable. Kidneys are normal, without renal calculi, focal lesion, or hydronephrosis. Bladder is unremarkable. Stomach/Bowel: There is questionable wall thickening of the cecum, ascending colon and hepatic flexure versus normal under distension. Appendix appears within normal limits. There is a large amount of stool throughout the colon. No bowel obstruction or free air. Stomach within normal limits. Vascular/Lymphatic: No significant vascular findings are present. No enlarged  abdominal or pelvic lymph nodes. Reproductive: Uterus and bilateral adnexa are unremarkable. Other: There is a small amount of ascites which has mildly increased. No abdominal wall hernia. Musculoskeletal: No acute or significant osseous findings. IMPRESSION: 1. Common bile duct stent in place.  Small amount of pneumobilia. 2. New mild periportal edema of uncertain etiology. 3. Questionable mild gallbladder wall edema. Correlate for cholecystitis. 4. Hypodense hepatic lesion has increased in size. Single new small hepatic lesion. Findings are concerning for worsening metastatic disease. 5. Mild wall thickening of the proximal colon worrisome for nonspecific colitis. 6. Small amount of ascites has increased. Electronically Signed   By: Ronney Asters M.D.   On: 11/18/2021 18:46  ? ?DG Abd Acute W/Chest ? ?Result Date: 11/18/2021 ?CLINICAL DATA:  ERCP today.  Pancreatic cancer.  Pain EXAM: DG ABDOMEN ACUTE WITH 1 VIEW CHEST COMPARISON:  CT abdomen pelvis 10/23/2021 FINDINGS: Lungs are clear without infiltrate or effusion. Negative for heart failure or effusion. Port-A-Cath tip in the SVC Normal bowel gas pattern. No free air. Common bile duct stent in satisfactory position. IMPRESSION: No active cardiopulmonary disease Normal bowel gas pattern. Electronically Signed   By: Franchot Gallo M.D.   On: 11/18/2021 18:33   ? ?Anti-infectives: ?Anti-infectives (From admission, onward)  ? ? Start     Dose/Rate Route Frequency Ordered Stop  ? 11/19/21 0500  piperacillin-tazobactam (ZOSYN) IVPB 3.375 g       ? 3.375 g ?12.5 mL/hr over 240 Minutes Intravenous Every 8 hours 11/18/21 2026    ? 11/18/21 2100  piperacillin-tazobactam (ZOSYN) IVPB 3.375 g       ? 3.375 g ?100 mL/hr over 30 Minutes Intravenous  Once 11/18/21 2026 11/18/21 2119  ? ?  ? ? ? ?Assessment/Plan ?Pancreatic adenocarcinoma with possible liver mets with abdominal pain, cholangitis  ?- Complex situation, difficult to know what the source(s) of her abdominal pain  is. Given normal WBC and no definite signs of cholecystitis on initial imaging seems less likely to be coming from her gallbladder. She unfortunately cannot tolerate HIDA scan. Discussed with GI team this morning and will start by repeating u/s. May consider ERCP/exchange her covered metal stent or percutaneous cholecystostomy tube placement if imaging more concerning for cholecystitis. Continue antibiotics.  ? ?FEN - NPO/IVFs ?VTE - lovenox ?ID - zosyn 3/7>> ?  ?Chronic pain - takes 3m contin TID and PO dilaudid at home ?H/o CVA ?H/o PE, on lovenox at home ?Tobacco abuse ? ?I reviewed Consultant gastroenterology notes, last 24 h vitals and pain scores, last 48 h intake and output, and last 24 h labs and trends ? ? ? LOS: 1 day  ? ? ?BWellington Hampshire PA-C ?[facility] ?11/20/2021, 9:38 AM ?Please see Amion for pager number during day hours 7:00am-4:30pm ? ?

## 2021-11-20 NOTE — Progress Notes (Signed)
[facility] Progress Note ? ? ? ?Since last GI note: ?She continues to have significant abd and back pains.  These pains have been going on for a long time, probably worse after her ERCP stent exchange at [facility] 2 days ago. ? ?Objective: ?Vital signs in last 24 hours: ?Temp:  [98.1 ?F (36.7 ?C)-98.9 ?F (37.2 ?C)] 98.1 ?F (36.7 ?C) (03/09 0534) ?Pulse Rate:  [54-58] 54 (03/09 0534) ?Resp:  [16-18] 18 (03/09 0534) ?BP: (144-162)/(65-88) 162/88 (03/09 0534) ?SpO2:  [99 %-100 %] 100 % (03/08 1254) ?Last BM Date : 11/19/21 ?General: alert and oriented times 3 ?Heart: regular rate and rythm ?Abdomen: soft, mild to moderately tender epig, non-distended, normal bowel sounds ? ? ?Lab Results: ?Recent Labs  ?  11/18/21 ?1517 11/19/21 ?0355 11/20/21 ?0352  ?WBC 11.2* 10.4 6.8  ?HGB 10.2* 8.9* 8.1*  ?PLT 416* 366 361  ?MCV 80.9 81.1 79.7*  ? ?Recent Labs  ?  11/18/21 ?1517 11/19/21 ?0355 11/20/21 ?0352  ?NA 131* 132* 134*  ?K 3.4* 3.5 2.9*  ?CL 98 101 101  ?CO2 '28 23 26  '$ ?GLUCOSE 102* 87 102*  ?BUN 5* 5* <5*  ?CREATININE 0.33* 0.36* 0.37*  ?CALCIUM 8.2* 7.7* 7.8*  ? ?Recent Labs  ?  11/18/21 ?1517 11/19/21 ?0355 11/20/21 ?0352  ?PROT 6.0* 4.8* 5.1*  ?ALBUMIN 2.5* 2.1* 2.1*  ?AST 104* 43* 30  ?ALT 89* 58* 47*  ?ALKPHOS 980* 713* 617*  ?BILITOT 3.9* 1.9* 1.4*  ? ?Medications: ?Scheduled Meds: ? enoxaparin (LOVENOX) injection  50 mg Subcutaneous Q12H  ? feeding supplement  237 mL Oral BID BM  ? morphine  30 mg Oral Q12H  ? pantoprazole (PROTONIX) IV  40 mg Intravenous Q12H  ? ?Continuous Infusions: ? lactated ringers with KCl/Additives Pediatric custom IV fluid    ? magnesium sulfate bolus IVPB    ? piperacillin-tazobactam (ZOSYN)  IV 3.375 g (11/20/21 0549)  ? potassium chloride    ? ?PRN Meds:.HYDROmorphone (DILAUDID) injection, HYDROmorphone ? ? ?Assessment/Plan: ?41 y.o. female pancreatic cancer, possibly metastatic based on newest CT scan, acute on chronic abd pains since ERCP 2 days ago at [facility]. ? ?It is difficult  to know the source of her pains which seem worse since her ERCP, stent exchang 2 days ago at [facility]. She was on 53m contin TID and dilated orally as well even before the ERCP.  Now her pain is barely controlled on IV narcotics. She has no WBC and LFTs are improving since the stent exchange.  I have some concern this may be pain from her GB and am getting a repeat RUQ UKore[place]oday (the [place]on 3/7 was several hours BEFORE her [facility] ERCP and showed no signs of cholecystitis FYI).  If this becomes more convincing for GB pain then options are to exchange her covered metal stent or perc GB drain.  I discussed this all with [facility] team and I've ordered UKorea ? ? ?DMilus Banister MD  11/20/2021, 8:17 AM ?[facility] ?Pager ([phone] ? ? ? ?

## 2021-11-20 NOTE — H&P (View-Only) (Signed)
[facility] Progress Note ? ? ? ?Since last GI note: ?She continues to have significant abd and back pains.  These pains have been going on for a long time, probably worse after her ERCP stent exchange at [facility] 2 days ago. ? ?Objective: ?Vital signs in last 24 hours: ?Temp:  [98.1 ?F (36.7 ?C)-98.9 ?F (37.2 ?C)] 98.1 ?F (36.7 ?C) (03/09 0534) ?Pulse Rate:  [54-58] 54 (03/09 0534) ?Resp:  [16-18] 18 (03/09 0534) ?BP: (144-162)/(65-88) 162/88 (03/09 0534) ?SpO2:  [99 %-100 %] 100 % (03/08 1254) ?Last BM Date : 11/19/21 ?General: alert and oriented times 3 ?Heart: regular rate and rythm ?Abdomen: soft, mild to moderately tender epig, non-distended, normal bowel sounds ? ? ?Lab Results: ?Recent Labs  ?  11/18/21 ?1517 11/19/21 ?0355 11/20/21 ?0352  ?WBC 11.2* 10.4 6.8  ?HGB 10.2* 8.9* 8.1*  ?PLT 416* 366 361  ?MCV 80.9 81.1 79.7*  ? ?Recent Labs  ?  11/18/21 ?1517 11/19/21 ?0355 11/20/21 ?0352  ?NA 131* 132* 134*  ?K 3.4* 3.5 2.9*  ?CL 98 101 101  ?CO2 '28 23 26  '$ ?GLUCOSE 102* 87 102*  ?BUN 5* 5* <5*  ?CREATININE 0.33* 0.36* 0.37*  ?CALCIUM 8.2* 7.7* 7.8*  ? ?Recent Labs  ?  11/18/21 ?1517 11/19/21 ?0355 11/20/21 ?0352  ?PROT 6.0* 4.8* 5.1*  ?ALBUMIN 2.5* 2.1* 2.1*  ?AST 104* 43* 30  ?ALT 89* 58* 47*  ?ALKPHOS 980* 713* 617*  ?BILITOT 3.9* 1.9* 1.4*  ? ?Medications: ?Scheduled Meds: ? enoxaparin (LOVENOX) injection  50 mg Subcutaneous Q12H  ? feeding supplement  237 mL Oral BID BM  ? morphine  30 mg Oral Q12H  ? pantoprazole (PROTONIX) IV  40 mg Intravenous Q12H  ? ?Continuous Infusions: ? lactated ringers with KCl/Additives Pediatric custom IV fluid    ? magnesium sulfate bolus IVPB    ? piperacillin-tazobactam (ZOSYN)  IV 3.375 g (11/20/21 0549)  ? potassium chloride    ? ?PRN Meds:.HYDROmorphone (DILAUDID) injection, HYDROmorphone ? ? ?Assessment/Plan: ?41 y.o. female pancreatic cancer, possibly metastatic based on newest CT scan, acute on chronic abd pains since ERCP 2 days ago at [facility] ? ?It is difficult  to know the source of her pains which seem worse since her ERCP, stent exchang 2 days ago at [facility]. She was on 84m contin TID and dilated orally as well even before the ERCP.  Now her pain is barely controlled on IV narcotics. She has no WBC and LFTs are improving since the stent exchange.  I have some concern this may be pain from her GB and am getting a repeat RUQ UKore[place]oday (the [place]on 3/7 was several hours BEFORE her [facility] ERCP and showed no signs of cholecystitis FYI).  If this becomes more convincing for GB pain then options are to exchange her covered metal stent or perc GB drain.  I discussed this all with [facility] team and I've ordered [place] ? ? ?DMilus Banister MD  11/20/2021, 8:17 AM ?[facility] ?Pager ([phone] ? ? ? ?

## 2021-11-20 NOTE — Progress Notes (Addendum)
?Progress Note ? ? ?Patient: Cynthia [name] GYI:[id number] DOB: Aug 17, 1981 DOA: 11/18/2021     1 ?DOS: the patient was seen and examined on 11/20/2021 ?  ?Brief hospital course: ?41 year old past medical history significant for pancreatic adenocarcinoma currently on chemotherapy complicated by biliary obstruction requiring stents, PE on Lovenox, pancreatitis.  Recently admitted 10/23/2021 for abdominal pain and treated for acute pancreatitis.  Patient had ERCP done at [facility]  the day of admission which revealed totally occluded CBD stent which was removed.  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  And treated with placement of a new a stent.  Ascending cholangitis was present and the biliary tree was swept with removal of both stone and sludge.  Patient was discharged with 5 days course of ciprofloxacin.  She presents to the ED complaining of worsening pain. ? ?CT showed common bile duct stent in place.  Small amount of pneumobilia.  New mild periportal edema of uncertain  etiology.  Questionable mild gallbladder wall edema.  Hypodense hepatic lesion has increased in size and single ?Small hepatic lesion finding concerning for worsening metastatic disease.  ?Mild wall thickening of the proximal colon worrisome for nonspecific colitis. ? ?GI and surgery has been consulted. ?Plan to repeat US, to determine if patient will need repeat ERCP/  ? ? ? ?Assessment and Plan: ?* Epigastric abdominal pain ?-Patient with history of pancreatic adenocarcinoma complicated by biliary obstruction requiring stent. ERCP done at [facility] on 11/18/2021 revealed totally occluded CBD stent which was removed.  There was severe biliary stricture in the lower third of the main bile duct which was malignant appearing and treated with placement of a new stent. Ascending cholangitis was present and the biliary tree was swept with removal of pus and sludge.   ?-CT done after ERCP showing common bile  duct stent in place. Questionable mild gallbladder wall edema.  Hypodense hepatic lesion has increased in size and single new small hepatic lesion, findings concerning for worsening metastatic disease.  ?-GI consulted and recommended HIDA scan initially. Patient refuse HIDA scan does not want to be off pain meds. ?-Surgery consulted, recommended continue with IV antibiotics. If no improvement might need cholecystostomy tube.  ?-Dilaudid as needed for pain. Resume home MS contin.  ?-IV fluid hydration ?-Blood culture no growth to date.  ?-Monitor lipase and LFTs. LFT trending down.  ?-IV protonix. IV Zosyn.  ?-Plan to repeat US> she will need repeat ERCP for possible biliary obstruction.  ?She is still having pain, will increase IV dilaudid to 12 mg, will have to be careful with oversedation. Will get palliative care involve. I spoke with Dr Ronnald Ramp her palliative care doctor at [facility].  ? ? ?Hypokalemia ?Hypomagnesemia. ?Replete IV> with KCl runs and add  kcl to IV fluids  ? ? ? ?Pancreatic adenocarcinoma ([place]) ?Follows at [facility] and currently on chemotherapy.  CT showing findings concerning for worsening metastatic disease. ?-Outpatient oncology follow-up ? ?History of pulmonary embolism ?-Resume Lovenox, home dose today ? ?Protein-calorie malnutrition, severe ?Continue with IV fluids.  ?Start diet when ok by GI ? ? ? ? ?  ? ?Subjective: she is still complaining of abdominal pain not better.  ? ?Physical Exam: ?Vitals:  ? 11/19/21 1254 11/19/21 2107 11/20/21 0534 11/20/21 1027  ?BP: (!) 153/84 (!) 153/86 (!) 162/88 (!) 168/93  ?Pulse: (!) 55 (!) 55 (!) 54 (!) 50  ?Resp: '18 20 18 16  '$ ?Temp: 98.9 ?F (37.2 ?C) 98.4 ?  F (36.9 ?C) 98.1 ?F (36.7 ?C) 98.3 ?F (36.8 ?C)  ?TempSrc: Oral Oral Oral Oral  ?SpO2: 100% 100%  100%  ?Weight:      ?Height:      ? ?General; NAD ?Lung; CTA ?Abdomen; soft, tender, no rigidity.  ? ?Data Reviewed: ? ?LFT and Bmet reviewed.  ? ?Family Communication: Care discussed with patient.   ? ?Disposition: ?Status is: Inpatient ?Remains inpatient appropriate because: needs IV fluids, pain controlled.  ? Planned Discharge Destination: Home ? ? ? ?Time spent: 45 minutes ? ?Author: ?Elmarie Shiley, MD ?11/20/2021 3:00 PM ? ?For on call review [email or web address].  ?

## 2021-11-20 NOTE — Assessment & Plan Note (Addendum)
Continue with IV fluids.  ?Tolerating diet.  ?

## 2021-11-20 NOTE — Progress Notes (Signed)
Seen earlier by Dr. Ardis Hughs. US shows gallbladder sludge with gallbladder wall thickening. There is concern that the covered stent my be occluding the cystic duct causing obstruction. Plan is for ERCP with removal of covered metal stent and placement of a plastic stent. The benefits and risks of ERCP with stent exchange including perforation, infection, bleeding, pancreatitis, and sedation risks were explained to the patient and she agrees to proceed.  ? ?She is getting BID Lovenox. I discontinued after this evening's dose at 8 pm ? ?NPO after MN  ? ? ?

## 2021-11-20 NOTE — Progress Notes (Signed)
Pt offered assistance if needed with a bath. Pt declined bath at this due to severe pain.  ?

## 2021-11-21 ENCOUNTER — Encounter (HOSPITAL_COMMUNITY): Payer: Self-pay | Admitting: Internal Medicine

## 2021-11-21 ENCOUNTER — Inpatient Hospital Stay (HOSPITAL_COMMUNITY): Payer: Medicaid Other

## 2021-11-21 ENCOUNTER — Inpatient Hospital Stay (HOSPITAL_COMMUNITY): Payer: Medicaid Other | Admitting: Certified Registered Nurse Anesthetist

## 2021-11-21 ENCOUNTER — Encounter (HOSPITAL_COMMUNITY)
Admission: EM | Disposition: A | Discharge status: Home Health | Payer: Self-pay | Source: Home / Self Care | Attending: Internal Medicine

## 2021-11-21 DIAGNOSIS — R7989 Other specified abnormal findings of blood chemistry: Secondary | ICD-10-CM | POA: Diagnosis not present

## 2021-11-21 DIAGNOSIS — C259 Malignant neoplasm of pancreas, unspecified: Secondary | ICD-10-CM

## 2021-11-21 DIAGNOSIS — E559 Vitamin D deficiency, unspecified: Secondary | ICD-10-CM

## 2021-11-21 DIAGNOSIS — Z8673 Personal history of transient ischemic attack (TIA), and cerebral infarction without residual deficits: Secondary | ICD-10-CM

## 2021-11-21 DIAGNOSIS — G893 Neoplasm related pain (acute) (chronic): Secondary | ICD-10-CM

## 2021-11-21 DIAGNOSIS — R1013 Epigastric pain: Secondary | ICD-10-CM | POA: Diagnosis not present

## 2021-11-21 DIAGNOSIS — Z515 Encounter for palliative care: Secondary | ICD-10-CM

## 2021-11-21 DIAGNOSIS — D630 Anemia in neoplastic disease: Secondary | ICD-10-CM

## 2021-11-21 DIAGNOSIS — K831 Obstruction of bile duct: Secondary | ICD-10-CM

## 2021-11-21 LAB — CBC
HCT: 25.3 % — ABNORMAL LOW (ref 36.0–46.0)
Hemoglobin: 8.8 g/dL — ABNORMAL LOW (ref 12.0–15.0)
MCH: 28.6 pg (ref 26.0–34.0)
MCHC: 34.8 g/dL (ref 30.0–36.0)
MCV: 82.1 fL (ref 80.0–100.0)
Platelets: 387 10*3/uL (ref 150–400)
RBC: 3.08 MIL/uL — ABNORMAL LOW (ref 3.87–5.11)
RDW: 14 % (ref 11.5–15.5)
WBC: 4.9 10*3/uL (ref 4.0–10.5)
nRBC: 0 % (ref 0.0–0.2)

## 2021-11-21 LAB — MAGNESIUM: Magnesium: 1.7 mg/dL (ref 1.7–2.4)

## 2021-11-21 LAB — BASIC METABOLIC PANEL
Anion gap: 7 (ref 5–15)
BUN: <5 mg/dL — ABNORMAL LOW (ref 6–20)
CO2: 26 mmol/L (ref 22–32)
Calcium: 8.1 mg/dL — ABNORMAL LOW (ref 8.9–10.3)
Chloride: 100 mmol/L (ref 98–111)
Creatinine, Ser: 0.35 mg/dL — ABNORMAL LOW (ref 0.44–1.00)
GFR, Estimated: >60 mL/min (ref >60)
Glucose, Bld: 81 mg/dL (ref 70–99)
Potassium: 4 mmol/L (ref 3.5–5.1)
Sodium: 133 mmol/L — ABNORMAL LOW (ref 135–145)

## 2021-11-21 LAB — VITAMIN D 25 HYDROXY (VIT D DEFICIENCY, FRACTURES): Vit D, 25-Hydroxy: 8.08 ng/mL — ABNORMAL LOW (ref 30–100)

## 2021-11-21 SURGERY — INVASIVE LAB ABORTED CASE
Anesthesia: General

## 2021-11-21 MED ORDER — ENOXAPARIN SODIUM 60 MG/0.6ML IJ SOSY
50.0000 mg | PREFILLED_SYRINGE | Freq: Two times a day (BID) | INTRAMUSCULAR | Status: DC
  Administered 2021-11-21 – 2021-11-26 (×10): 50 mg via SUBCUTANEOUS
  Filled 2021-11-21 (×11): qty 0.6

## 2021-11-21 MED ORDER — LACTATED RINGERS IV SOLN
INTRAVENOUS | Status: AC | PRN
  Administered 2021-11-21: 1000 mL via INTRAVENOUS

## 2021-11-21 MED ORDER — PROPOFOL 500 MG/50ML IV EMUL
INTRAVENOUS | Status: AC
  Filled 2021-11-21: qty 100

## 2021-11-21 MED ORDER — MIDAZOLAM HCL 5 MG/5ML IJ SOLN
INTRAMUSCULAR | Status: DC | PRN
  Administered 2021-11-21: 2 mg via INTRAVENOUS

## 2021-11-21 MED ORDER — MAGNESIUM SULFATE 2 GM/50ML IV SOLN
2.0000 g | Freq: Once | INTRAVENOUS | Status: AC
  Administered 2021-11-21: 2 g via INTRAVENOUS
  Filled 2021-11-21: qty 50

## 2021-11-21 MED ORDER — FENTANYL CITRATE (PF) 100 MCG/2ML IJ SOLN
INTRAMUSCULAR | Status: AC
Start: 2021-11-21 — End: ?
  Filled 2021-11-21: qty 2

## 2021-11-21 MED ORDER — MIDAZOLAM HCL 2 MG/2ML IJ SOLN
INTRAMUSCULAR | Status: AC
  Filled 2021-11-21: qty 2

## 2021-11-21 MED ORDER — GLUCAGON HCL RDNA (DIAGNOSTIC) 1 MG IJ SOLR
INTRAMUSCULAR | Status: AC
  Filled 2021-11-21: qty 1

## 2021-11-21 MED ORDER — SUGAMMADEX SODIUM 200 MG/2ML IV SOLN
INTRAVENOUS | Status: DC | PRN
  Administered 2021-11-21: 200 mg via INTRAVENOUS

## 2021-11-21 MED ORDER — PROPOFOL 10 MG/ML IV BOLUS
INTRAVENOUS | Status: AC
  Filled 2021-11-21: qty 20

## 2021-11-21 MED ORDER — ONDANSETRON HCL 4 MG/2ML IJ SOLN
INTRAMUSCULAR | Status: DC | PRN
  Administered 2021-11-21: 4 mg via INTRAVENOUS

## 2021-11-21 MED ORDER — NALOXONE HCL 0.4 MG/ML IJ SOLN: 0.4000 mg | INTRAMUSCULAR | Status: DC | PRN

## 2021-11-21 MED ORDER — PROPOFOL 10 MG/ML IV BOLUS
INTRAVENOUS | Status: DC | PRN
  Administered 2021-11-21: 120 mg via INTRAVENOUS

## 2021-11-21 MED ORDER — MORPHINE SULFATE ER 30 MG PO TBCR
45.0000 mg | EXTENDED_RELEASE_TABLET | Freq: Three times a day (TID) | ORAL | Status: DC
  Administered 2021-11-22 (×2): 45 mg via ORAL
  Filled 2021-11-21 (×2): qty 1

## 2021-11-21 MED ORDER — ROCURONIUM BROMIDE 10 MG/ML (PF) SYRINGE
PREFILLED_SYRINGE | INTRAVENOUS | Status: DC | PRN
Start: 2021-11-21 — End: 2021-11-21
  Administered 2021-11-21: 30 mg via INTRAVENOUS
  Administered 2021-11-21: 10 mg via INTRAVENOUS

## 2021-11-21 MED ORDER — VITAMIN D (ERGOCALCIFEROL) 1.25 MG (50000 UNIT) PO CAPS
50000.0000 [IU] | ORAL_CAPSULE | ORAL | Status: DC
  Administered 2021-11-21: 50000 [IU] via ORAL
  Filled 2021-11-21: qty 1

## 2021-11-21 MED ORDER — DEXAMETHASONE SODIUM PHOSPHATE 10 MG/ML IJ SOLN
INTRAMUSCULAR | Status: DC | PRN
Start: 2021-11-21 — End: 2021-11-21
  Administered 2021-11-21: 8 mg via INTRAVENOUS

## 2021-11-21 MED ORDER — LIDOCAINE 2% (20 MG/ML) 5 ML SYRINGE
INTRAMUSCULAR | Status: DC | PRN
  Administered 2021-11-21: 60 mg via INTRAVENOUS

## 2021-11-21 MED ORDER — HYDROMORPHONE HCL 1 MG/ML IJ SOLN
1.0000 mg | INTRAMUSCULAR | Status: DC | PRN
  Administered 2021-11-21 – 2021-11-23 (×18): 1 mg via INTRAVENOUS
  Filled 2021-11-21 (×18): qty 1

## 2021-11-21 MED ORDER — MORPHINE SULFATE ER 15 MG PO TBCR
15.0000 mg | EXTENDED_RELEASE_TABLET | Freq: Once | ORAL | Status: AC
  Administered 2021-11-21: 15 mg via ORAL
  Filled 2021-11-21: qty 1

## 2021-11-21 MED ORDER — INDOMETHACIN 50 MG RE SUPP
RECTAL | Status: AC
  Filled 2021-11-21: qty 2

## 2021-11-21 MED ORDER — FENTANYL CITRATE (PF) 100 MCG/2ML IJ SOLN
INTRAMUSCULAR | Status: DC | PRN
  Administered 2021-11-21 (×2): 50 ug via INTRAVENOUS

## 2021-11-21 NOTE — Assessment & Plan Note (Signed)
Started supplement.  ?

## 2021-11-21 NOTE — TOC Progression Note (Signed)
Transition of Care (TOC) - Progression Note  ? ? ?Patient Details  ?Name: Cynthia [name] ?MRN: [id number] ?Date of Birth: [birth year] ? ?Transition of Care (TOC) CM/SW Contact  ?Purcell Mouton, RN ?Phone Number: ?11/21/2021, 2:35 PM ? ?Clinical Narrative:    ? ? ? ?  ?Transition of Care (TOC) Screening Note ? ? ?Patient Details  ?Name: Cynthia [name] ?Date of Birth: Nov 11, 1980 ? ? ?Transition of Care (TOC) CM/SW Contact:    ?Purcell Mouton, RN ?Phone Number: ?11/21/2021, 2:35 PM ? ? ? ?Transition of Care Department [facility] has reviewed patient and no TOC needs have been identified at this time. We will continue to monitor patient advancement through interdisciplinary progression rounds. If new patient transition needs arise, please place a TOC consult. ?  ?  ? ?Expected Discharge Plan and Services ?  ?  ?  ?  ?  ?                ?  ?  ?  ?  ?  ?  ?  ?  ?  ?  ? ? ?Social Determinants of Health (SDOH) Interventions ?  ? ?Readmission Risk Interventions ?No flowsheet data found. ? ?

## 2021-11-21 NOTE — Anesthesia Preprocedure Evaluation (Signed)
Anesthesia Evaluation  ?Patient identified by MRN, date of birth, ID band ?Patient awake ? ? ? ?Reviewed: ?Allergy & Precautions, NPO status , Patient's Chart, lab work & pertinent test results ? ?Airway ?Mallampati: II ? ?TM Distance: >3 FB ?Neck ROM: Full ? ? ? Dental ? ?(+) Dental Advisory Given ?  ?Pulmonary ?Current Smoker,  ?  ?breath sounds clear to auscultation ? ? ? ? ? ? Cardiovascular ?+ DVT  ? ?Rhythm:Regular Rate:Normal ? ? ?  ?Neuro/Psych ?CVA   ? GI/Hepatic ?Neg liver ROS, Pancreatic CA ?  ?Endo/Other  ?negative endocrine ROS ? Renal/GU ?negative Renal ROS  ? ?  ?Musculoskeletal ? ? Abdominal ?  ?Peds ? Hematology ? ?(+) Blood dyscrasia, anemia ,   ?Anesthesia Other Findings ? ? Reproductive/Obstetrics ? ?  ? ? ? ? ? ? ? ? ? ? ? ? ? ?  ?  ? ? ? ? ? ? ? ? ?Lab Results  ?Component Value Date  ? WBC 4.9 11/21/2021  ? HGB 8.8 (L) 11/21/2021  ? HCT 25.3 (L) 11/21/2021  ? MCV 82.1 11/21/2021  ? PLT 387 11/21/2021  ? ?Lab Results  ?Component Value Date  ? CREATININE 0.35 (L) 11/21/2021  ? BUN <5 (L) 11/21/2021  ? NA 133 (L) 11/21/2021  ? K 4.0 11/21/2021  ? CL 100 11/21/2021  ? CO2 26 11/21/2021  ? ? ?Anesthesia Physical ?Anesthesia Plan ? ?ASA: 3 ? ?Anesthesia Plan: General  ? ?Post-op Pain Management: Minimal or no pain anticipated  ? ?Induction: Intravenous ? ?PONV Risk Score and Plan: 2 and Dexamethasone, Ondansetron and Treatment may vary due to age or medical condition ? ?Airway Management Planned: Oral ETT ? ?Additional Equipment: None ? ?Intra-op Plan:  ? ?Post-operative Plan: Extubation in OR ? ?Informed Consent: I have reviewed the patients History and Physical, chart, labs and discussed the procedure including the risks, benefits and alternatives for the proposed anesthesia with the patient or authorized representative who has indicated his/her understanding and acceptance.  ? ? ? ?Dental advisory given ? ?Plan Discussed with: CRNA ? ?Anesthesia Plan Comments:    ? ? ? ? ? ? ?Anesthesia Quick Evaluation ? ?

## 2021-11-21 NOTE — Progress Notes (Signed)
?Progress Note ? ? ?Patient: Cynthia [name] QPY:[id number] DOB: 06-23-1981 DOA: 11/18/2021     2 ?DOS: the patient was seen and examined on 11/21/2021 ?  ?Brief hospital course: ?41 year old past medical history significant for pancreatic adenocarcinoma currently on chemotherapy complicated by biliary obstruction requiring stents, PE on Lovenox, pancreatitis.  Recently admitted 10/23/2021 for abdominal pain and treated for acute pancreatitis.  Patient had ERCP done at [facility]  the day of admission which revealed totally occluded CBD stent which was removed.  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  And treated with placement of a new a stent.  Ascending cholangitis was present and the biliary tree was swept with removal of both stone and sludge.  Patient was discharged with 5 days course of ciprofloxacin.  She presents to the ED complaining of worsening pain. ? ?CT showed common bile duct stent in place.  Small amount of pneumobilia.  New mild periportal edema of uncertain  etiology.  Questionable mild gallbladder wall edema.  Hypodense hepatic lesion has increased in size and single ?Small hepatic lesion finding concerning for worsening metastatic disease.  ?Mild wall thickening of the proximal colon worrisome for nonspecific colitis. ? ?GI and surgery has been consulted. ?Plan to repeat US, to determine if patient will need repeat ERCP/  ? ? ? ?Assessment and Plan: ?* Epigastric abdominal pain ?-Patient with history of pancreatic adenocarcinoma complicated by biliary obstruction requiring stent. ERCP done at [facility] on 11/18/2021 revealed totally occluded CBD stent which was removed.  There was severe biliary stricture in the lower third of the main bile duct which was malignant appearing and treated with placement of a new stent. Ascending cholangitis was present and the biliary tree was swept with removal of pus and sludge.   ?-CT done after ERCP showing common  bile duct stent in place. Questionable mild gallbladder wall edema.  Hypodense hepatic lesion has increased in size and single new small hepatic lesion, findings concerning for worsening metastatic disease.  ?-GI consulted and recommended HIDA scan initially. Patient refuse HIDA scan does not want to be off pain meds. ?-Surgery consulted, recommended continue with IV antibiotics. If no improvement might need cholecystostomy tube.  ?-Dilaudid as needed for pain. Resume home MS contin.  ?-IV fluid hydration ?-Blood culture no growth to date.  ?-Monitor lipase and LFTs. LFT trending down.  ?-IV protonix. IV Zosyn.  ?-Repeated US : Layering sludge in the gallbladder with gallbladder wall ?thickening and positive sonographic Murphy sign. Biliary duct stent in place. If there is high clinical concern for biliary ductal obstruction, HIDA scan could be obtained for further evaluation. Gallbladder sludge and wall thickening could be secondary to biliary stasis/reactive. ?Underwent to ERCP 3/10 Dr Ardis Hughs couldn't advance  duodenoscope past distal duodenal bulb. Plan to repeat LFT tomorrow, if pain is not improved by tomorrow she will need transfer to [facility].  ?Palliative consulted to assist with pain management.  ? ? ?Hypokalemia ?Hypomagnesemia: replete IV>  ?Potassium replaced.  ? ? ? ?Pancreatic adenocarcinoma ([place]) ?Follows at [facility] and currently on chemotherapy.  CT showing findings concerning for worsening metastatic disease. ?-Outpatient oncology follow-up ? ?History of pulmonary embolism ?-Contiue with Lovenox ? ?Vitamin D deficiency ?Started supplement.  ? ?Protein-calorie malnutrition, severe ?Continue with IV fluids.  ?Start diet when ok by GI ? ? ? ? ?  ? ?Subjective: still having abdominal pain. Not better. I saw her prior to ERCP/  ? ?Physical Exam: ?Vitals:  ?  11/21/21 1230 11/21/21 1239 11/21/21 1307 11/21/21 1342  ?BP: (!) 196/90  (!) 192/91 (!) 165/81  ?Pulse:  (!) 43 (!) 43 (!) 47  ?Resp:  '14 14 14  '$ ?Temp:     97.9 ?F (36.6 ?C)  ?TempSrc:    Oral  ?SpO2:  100% 100% 100%  ?Weight:      ?Height:      ? ?General; appears more calm, less pain ?CVS; S 1, S 2 RRR ?Lung; CTA ?Abdomen; soft, nt ? ?Data Reviewed: ? ?Bmet and cbc reviewed.  ? ?Family Communication: Care discussed with patient.  ? ?Disposition: ?Status is: Inpatient ?Remains inpatient appropriate because: requiring IV fluids, not tolerating diet.  ? Planned Discharge Destination: Home ? ? ? ?Time spent: 45 minutes ? ?Author: ?Elmarie Shiley, MD ?11/21/2021 2:25 PM ? ?For on call review [email or web address].  ?

## 2021-11-21 NOTE — Progress Notes (Signed)
Patient ID: Cynthia [name], female   DOB: 03-May-1981, 41 y.o.   MRN: [id number] ?[facility] ?Progress Note ? ?   ?Subjective: ?CC-  ?Continues to have epigastric/RUQ pain radiating in her back. Feels this is slightly better with the increase in her pain meds. States that she has had abdominal pain for a long time, and the quality of her pain is the same but it is more severe the last several days compared to her baseline. Some nausea at times, no emesis. About to go down to endo for ERCP stent exchange.  ? ?Objective: ?Vital signs in last 24 hours: ?Temp:  [98.3 ?F (36.8 ?C)-98.7 ?F (37.1 ?C)] 98.7 ?F (37.1 ?C) (03/10 0500) ?Pulse Rate:  [49-54] 50 (03/10 0500) ?Resp:  [2-21] 21 (03/10 0800) ?BP: (128-168)/(79-93) 150/88 (03/10 0500) ?SpO2:  [100 %] 100 % (03/10 0500) ?Last BM Date : 11/19/21 ? ?Intake/Output from previous day: ?03/09 0701 - 03/10 0700 ?In: 1304.1 [P.O.:240; I.V.:399.7; IV Piggyback:664.4] ?Out: -  ?Intake/Output this shift: ?No intake/output data recorded. ? ?PE: ?Gen:  Alert, NAD ?Abd: Soft, ND, mild TTP epigastric and RUQ ? ?Lab Results:  ?Recent Labs  ?  11/20/21 ?0352 11/21/21 ?0844  ?WBC 6.8 4.9  ?HGB 8.1* 8.8*  ?HCT 23.2* 25.3*  ?PLT 361 387  ? ?BMET ?Recent Labs  ?  11/19/21 ?0355 11/20/21 ?0352  ?NA 132* 134*  ?K 3.5 2.9*  ?CL 101 101  ?CO2 23 26  ?GLUCOSE 87 102*  ?BUN 5* <5*  ?CREATININE 0.36* 0.37*  ?CALCIUM 7.7* 7.8*  ? ?PT/INR ?No results for input(s): LABPROT, INR in the last 72 hours. ?CMP  ?   ?Component Value Date/Time  ? NA 134 (L) 11/20/2021 0352  ? K 2.9 (L) 11/20/2021 0352  ? CL 101 11/20/2021 0352  ? CO2 26 11/20/2021 0352  ? GLUCOSE 102 (H) 11/20/2021 0352  ? BUN <5 (L) 11/20/2021 0352  ? CREATININE 0.37 (L) 11/20/2021 0352  ? CALCIUM 7.8 (L) 11/20/2021 0352  ? PROT 5.1 (L) 11/20/2021 0352  ? ALBUMIN 2.1 (L) 11/20/2021 0352  ? AST 30 11/20/2021 0352  ? ALT 47 (H) 11/20/2021 0352  ? ALKPHOS 617 (H) 11/20/2021 0352  ? BILITOT 1.4 (H) 11/20/2021 0352  ? GFRNONAA  >60 11/20/2021 0352  ? GFRAA >60 05/27/2015 1500  ? ?Lipase  ?   ?Component Value Date/Time  ? LIPASE 15 11/19/2021 0355  ? ?Studies/Results: ?US Abdomen Limited RUQ (LIVER/GB) ? ?Result Date: 11/20/2021 ?CLINICAL DATA:  Epigastric pain EXAM: ULTRASOUND ABDOMEN LIMITED RIGHT UPPER QUADRANT COMPARISON:  CT examination dated November 18, 2021 FINDINGS: Gallbladder: Moderate amount of layering gallbladder sludge. Gallbladder wall is thickened measuring up to 5 mm. Technologist reported positive sonographic Murphy sign. Common bile duct: Diameter: 5 mm.  Common duct stent is noted. Liver: No focal lesion identified. Within normal limits in parenchymal echogenicity. Portal vein is patent on color Doppler imaging with normal direction of blood flow towards the liver. Other: None. IMPRESSION: 1. Layering sludge in the gallbladder with gallbladder wall thickening and positive sonographic Murphy sign. Biliary duct stent in place. If there is high clinical concern for biliary ductal obstruction, HIDA scan could be obtained for further evaluation. Gallbladder sludge and wall thickening could be secondary to biliary stasis/reactive. Clinical correlation is suggested. 2.  No appreciable hepatic mass. Electronically Signed   By: Keane Police D.O.   On: 11/20/2021 12:14   ? ?Anti-infectives: ?Anti-infectives (From admission, onward)  ? ? Start     Dose/Rate  Route Frequency Ordered Stop  ? 11/19/21 0500  piperacillin-tazobactam (ZOSYN) IVPB 3.375 g       ? 3.375 g ?12.5 mL/hr over 240 Minutes Intravenous Every 8 hours 11/18/21 2026    ? 11/18/21 2100  piperacillin-tazobactam (ZOSYN) IVPB 3.375 g       ? 3.375 g ?100 mL/hr over 30 Minutes Intravenous  Once 11/18/21 2026 11/18/21 2119  ? ?  ? ? ? ?Assessment/Plan ?Pancreatic adenocarcinoma with possible liver mets with abdominal pain, cholangitis  ?- Complex situation, difficult to know what the source(s) of her abdominal pain is given her chronic pain. IT is possible her CBD stent may  also be occluding the cystic duct. Going for ERCP/exchange of her covered metal stent today. If she does have cholecystitis then would likely treat with antibiotics alone vs cholecystostomy tube. Continue antibiotics.  ? ?FEN - NPO/IVFs ?VTE - lovenox ?ID - zosyn 3/7>> ?  ?Chronic pain - takes 5m contin TID and PO dilaudid at home ?H/o CVA ?H/o PE, on lovenox at home ?Tobacco abuse ? ?I reviewed Consultant gastroenterology notes, last 24 h vitals and pain scores, last 48 h intake and output, and last 24 h labs and trends ? ? ? LOS: 2 days  ? ? ?EJill Alexanders PA-C ?[facility] ?11/21/2021, 9:11 AM ?Please see Amion for pager number during day hours 7:00am-4:30pm ? ?

## 2021-11-21 NOTE — Plan of Care (Signed)

## 2021-11-21 NOTE — Transfer of Care (Signed)
Immediate Anesthesia Transfer of Care Note ? ?Patient: Cynthia [name] ? ?Procedure(s) Performed: Procedure(s): ?ABORTED ERCP ? ?Patient Location: PACU ? ?Anesthesia Type:General ? ?Level of Consciousness: Alert, Awake, Oriented ? ?Airway & Oxygen Therapy: Patient Spontanous Breathing ? ?Post-op Assessment: Report given to RN ? ?Post vital signs: Reviewed and stable ? ?Last Vitals:  ?Vitals:  ? 11/21/21 0800 11/21/21 1011  ?BP:  (!) 194/96  ?Pulse:  (!) 47  ?Resp: (!) 21 10  ?Temp:  36.9 ?C  ?SpO2:  99%  ? ? ?Complications: No apparent anesthesia complications ? ?

## 2021-11-21 NOTE — Anesthesia Postprocedure Evaluation (Signed)
Anesthesia Post Note ? ?Patient: TRYSTA SHOWMAN ? ?Procedure(s) Performed: ABORTED ERCP ? ?  ? ?Patient location during evaluation: PACU ?Anesthesia Type: General ?Level of consciousness: awake and alert ?Pain management: pain level controlled ?Vital Signs Assessment: post-procedure vital signs reviewed and stable ?Respiratory status: spontaneous breathing, nonlabored ventilation, respiratory function stable and patient connected to nasal cannula oxygen ?Cardiovascular status: blood pressure returned to baseline and stable ?Postop Assessment: no apparent nausea or vomiting ?Anesthetic complications: no ? ? ?No notable events documented. ? ?Last Vitals:  ?Vitals:  ? 11/21/21 1307 11/21/21 1342  ?BP: (!) 192/91 (!) 165/81  ?Pulse: (!) 43 (!) 47  ?Resp: 14 14  ?Temp:  36.6 ?C  ?SpO2: 100% 100%  ?  ?Last Pain:  ?Vitals:  ? 11/21/21 1426  ?TempSrc:   ?PainSc: Asleep  ? ? ?  ?  ?  ?  ?  ?  ? ?Suzette Battiest E ? ? ? ? ?

## 2021-11-21 NOTE — Interval H&P Note (Signed)
History and Physical Interval Note: ? ?11/21/2021 ?10:18 AM ? ?Cynthia [name]  has presented today for surgery, with the diagnosis of abdominal pain , pancreatic cancer.  The various methods of treatment have been discussed with the patient and family. After consideration of risks, benefits and other options for treatment, the patient has consented to  Procedure(s): ?ENDOSCOPIC RETROGRADE CHOLANGIOPANCREATOGRAPHY (ERCP) (N/A) as a surgical intervention.  The patient's history has been reviewed, patient examined, no change in status, stable for surgery.  I have reviewed the patient's chart and labs.  Questions were answered to the patient's satisfaction.   ? ? ?[name] ? ? ?

## 2021-11-21 NOTE — Anesthesia Procedure Notes (Signed)
Procedure Name: Intubation ?Date/Time: 11/21/2021 10:39 AM ?Performed by: Gerald Leitz, CRNA ?Pre-anesthesia Checklist: Patient identified, Patient being monitored, Timeout performed, Emergency Drugs available and Suction available ?Patient Re-evaluated:Patient Re-evaluated prior to induction ?Oxygen Delivery Method: Circle system utilized ?Preoxygenation: Pre-oxygenation with 100% oxygen ?Induction Type: IV induction ?Ventilation: Mask ventilation without difficulty ?Laryngoscope Size: Mac and 3 ?Grade View: Grade I ?Tube type: Oral ?Tube size: 7.0 mm ?Number of attempts: 1 ?Airway Equipment and Method: Stylet ?Placement Confirmation: ETT inserted through vocal cords under direct vision, positive ETCO2 and breath sounds checked- equal and bilateral ?Secured at: 21 cm ?Tube secured with: Tape ?Dental Injury: Teeth and Oropharynx as per pre-operative assessment  ? ? ? ? ?

## 2021-11-21 NOTE — Progress Notes (Signed)
ERCP attempt today reviewed.  In light of her persistent pain and inability to truly determine if her pain is new vs chronic, etc, I have reached out to her oncologist Dr. Earnestine Mealing at [facility].  He has graciously given permission for me to share his thoughts so we would all know what his plans and thoughts are regarding her current condition and future treatment plans.  Please see below for his response to me.  Otherwise, would try to continue conservative treatment so she can return to see him this Thursday as planned scheduled follow up.  Given equivocal findings for cholecystitis, we would not want to consider pursuit of intervention (surgical or perc chole drain) unless she more clearly defined this with fevers, leukocytosis, N/V, etc.  We will continue to follow. ? ? ?From Dr. Earnestine Mealing, medical oncologist: ?"She has a locally advanced pancreatic cancer with concerns that she has new liver mets on her recent scans as you point out. Her pain has been escalating even prior to the recent ERCP and stent placement. So I think her pain is probably cancer related as much as anything else. I think your labs (lipase)  for post procedure pancreatitis were negative. I don't think a HIDA will help much. With the concern for new mets there is no plan for surgery so I think the management is primarily medical. She did have copious amounts of pus evacuated at the time of her recent ERCP so it is possible infection is playing a role. Long term management would be to consider additional palliative chemotherapy but she has been losing a lot of weight so I'm not really optimistic about this. If you can push her recent imaging we can review.  I would try to hold off on a cholecystostomy tube as once these are placed they are very difficult to get out and I think the patient will have difficulty managing it in the outpatient setting. Again, her pain has been chronic and may well be largely cancer related rather than post  procedural." ? ?At his request, I have pushed her imaging from our system to theirs for review. ? ?Cynthia [name] ?2:48 PM ?11/21/2021 ? ?

## 2021-11-21 NOTE — Op Note (Addendum)
[facility] ?Patient Name: Cynthia [name] ?Procedure Date: 11/21/2021 ?MRN: [id number] ?Attending MD: [name] , MD ?Date of Birth: 22-Apr-1981 ?CSN: [id number] ?Age: 41 ?Admit Type: Inpatient ?Procedure:                ERCP ?Indications:              Known pancreatic adenocarcinoma. [facility] ERCP 3 days  ?                          ago with removal of occluded covered biliary stent  ?                          and then placement of 8cm long 4m diameter  ?                          covered metal stent. Admitted here 5-6 hours later  ?                          with worsening of her chronic abdominal pains. LFTs  ?                          have improved however I have concern for possible  ?                          obstructed cystic duct based on pain, [place]  ?                          suggesting "possible cholecystitis". WBC normal. ?Providers:                DMilus Banister MD, LBurtis Junes RN, Janie Billups,  ?                          Technician ?Referring MD:              ?Medicines:                General Anesthesia ?Complications:            No immediate complications. Estimated blood loss:  ?                          None ?Estimated Blood Loss:     Estimated blood loss: none. ?Procedure:                Pre-Anesthesia Assessment: ?                          - Prior to the procedure, a History and Physical  ?                          was performed, and patient medications and  ?                          allergies were reviewed. The patient's tolerance of  ?  previous anesthesia was also reviewed. The risks  ?                          and benefits of the procedure and the sedation  ?                          options and risks were discussed with the patient.  ?                          All questions were answered, and informed consent  ?                          was obtained. Prior Anticoagulants: The patient has  ?                          taken no previous  anticoagulant or antiplatelet  ?                          agents. ASA Grade Assessment: III - A patient with  ?                          severe systemic disease. After reviewing the risks  ?                          and benefits, the patient was deemed in  ?                          satisfactory condition to undergo the procedure. ?                          After obtaining informed consent, the scope was  ?                          passed under direct vision. Throughout the  ?                          procedure, the patient's blood pressure, pulse, and  ?                          oxygen saturations were monitored continuously. The  ?                          TJF-[id number] ([id number]) Olympus duodenoscope was  ?                          introduced through the mouth, The procedure was  ?                          aborted. The scope was not inserted. bile ducts  ?                          Medications were duodenum. The ERCP was  ?  accomplished without difficulty. The patient  ?                          tolerated the procedure well. ?Scope In: ?Scope Out: ?Findings: ?     Metal biliary stent was visible on scout film. I advanced the  ?     duodenoscope to the duodenal bulb but could not advance it past the  ?     distal duodenal bulb despite several patient position changes and  ?     abdominal pressure. I believe this was more due to what seemed to be  ?     significant looping of the scope in her stomach but there was mild  ?     stenosis of the distal duodenal bulb as well was limited scope passage  ?     as well. ?Impression:               I was unable to advance the duodenoscope past the  ?                          distal duodenal bulb despite several patient  ?                          position changes and abdominal pressure. I believe  ?                          this was more due to what seemed to be significant  ?                          looping of the scope in her stomach but there was  ?                           mild stenosis of the distal duodenal bulb as well  ?                          was limited scope passage as well. ?                          The recently replaced metal biliary stent was in  ?                          expected location on scout film. ?Moderate Sedation: ?     Not Applicable - Patient had care per Anesthesia. ?Recommendation:           - Her LFTs are continuing to improve. Evidence for  ?                          cholecystitis has been equivocal. ?                          - Continue IV ABx overnight, repeat LFTS in the  ?                          morning, if she is still having significantly worse  ?  abd pain that her usual then I think it will be  ?                          best to arrange transfer to [place] where she is well  ?                          known by GI and oncology so they could decide about  ?                          further instrumentation (perc cholecystectomy, lap  ?                          cholecystectomy). ?Procedure Code(s):        --- Professional --- ?                          85631, 52, Endoscopic retrograde  ?                          cholangiopancreatography (ERCP); diagnostic,  ?                          including collection of specimen(s) by brushing or  ?                          washing, when performed (separate procedure) ?Diagnosis Code(s):        --- Professional --- ?                          K83.1, Obstruction of bile duct ?CPT copyright 2019 American Medical Association. All rights reserved. ?The codes documented in this report are preliminary and upon coder review may  ?be revised to meet current compliance requirements. ?[name], MD ?11/21/2021 12:29:53 PM ?This report has been signed electronically. ?Number of Addenda: 0 ?

## 2021-11-21 NOTE — Progress Notes (Signed)
BRIEF PHARMACY NOTE ? ?Cynthia [name] is on Lovenox '1mg'$ /kg ('50mg'$ ) [place] q12 hours for history of PE.  For full history, see anticoagulation note by Theodis Shove, PharmD on 11/19/21.  Lovenox was held on 3/9 in anticipation for ERCP on 3/10. ? ?Discussed with GI - ok to resume Lovenox now. ? ?Plan: ?- Resume Lovenox '1mg'$ /kg [place] q12h ? ?Dimple Nanas, PharmD ?11/21/2021 1:41 PM ? ?

## 2021-11-22 DIAGNOSIS — Z515 Encounter for palliative care: Secondary | ICD-10-CM | POA: Diagnosis not present

## 2021-11-22 DIAGNOSIS — G893 Neoplasm related pain (acute) (chronic): Secondary | ICD-10-CM | POA: Diagnosis not present

## 2021-11-22 DIAGNOSIS — R1013 Epigastric pain: Secondary | ICD-10-CM | POA: Diagnosis not present

## 2021-11-22 LAB — CBC
HCT: 25.4 % — ABNORMAL LOW (ref 36.0–46.0)
Hemoglobin: 8.7 g/dL — ABNORMAL LOW (ref 12.0–15.0)
MCH: 28.6 pg (ref 26.0–34.0)
MCHC: 34.3 g/dL (ref 30.0–36.0)
MCV: 83.6 fL (ref 80.0–100.0)
Platelets: 394 10*3/uL (ref 150–400)
RBC: 3.04 MIL/uL — ABNORMAL LOW (ref 3.87–5.11)
RDW: 13.9 % (ref 11.5–15.5)
WBC: 7.2 10*3/uL (ref 4.0–10.5)
nRBC: 0 % (ref 0.0–0.2)

## 2021-11-22 LAB — COMPREHENSIVE METABOLIC PANEL
ALT: 30 U/L (ref 0–44)
AST: 15 U/L (ref 15–41)
Albumin: 2.3 g/dL — ABNORMAL LOW (ref 3.5–5.0)
Alkaline Phosphatase: 527 U/L — ABNORMAL HIGH (ref 38–126)
Anion gap: 11 (ref 5–15)
BUN: <5 mg/dL — ABNORMAL LOW (ref 6–20)
CO2: 26 mmol/L (ref 22–32)
Calcium: 8.5 mg/dL — ABNORMAL LOW (ref 8.9–10.3)
Chloride: 97 mmol/L — ABNORMAL LOW (ref 98–111)
Creatinine, Ser: 0.36 mg/dL — ABNORMAL LOW (ref 0.44–1.00)
GFR, Estimated: >60 mL/min (ref >60)
Glucose, Bld: 82 mg/dL (ref 70–99)
Potassium: 4 mmol/L (ref 3.5–5.1)
Sodium: 134 mmol/L — ABNORMAL LOW (ref 135–145)
Total Bilirubin: 1.1 mg/dL (ref 0.3–1.2)
Total Protein: 5.2 g/dL — ABNORMAL LOW (ref 6.5–8.1)

## 2021-11-22 MED ORDER — MORPHINE SULFATE ER 30 MG PO TBCR
60.0000 mg | EXTENDED_RELEASE_TABLET | Freq: Three times a day (TID) | ORAL | Status: DC
  Administered 2021-11-22 – 2021-11-24 (×7): 60 mg via ORAL
  Filled 2021-11-22 (×7): qty 2

## 2021-11-22 MED ORDER — AMOXICILLIN-POT CLAVULANATE 875-125 MG PO TABS
1.0000 | ORAL_TABLET | Freq: Two times a day (BID) | ORAL | Status: DC
  Administered 2021-11-22 – 2021-11-26 (×9): 1 via ORAL
  Filled 2021-11-22 (×9): qty 1

## 2021-11-22 MED ORDER — DICYCLOMINE HCL 20 MG PO TABS
20.0000 mg | ORAL_TABLET | Freq: Two times a day (BID) | ORAL | Status: DC | PRN
  Administered 2021-11-22: 20 mg via ORAL
  Filled 2021-11-22 (×5): qty 1

## 2021-11-22 NOTE — Plan of Care (Signed)
?  Problem: Education: ?Goal: Knowledge of General Education information will improve ?Description: Including pain rating scale, medication(s)/side effects and non-pharmacologic comfort measures ?11/22/2021 1620 by Zadie Rhine, RN ?Outcome: Progressing ?11/22/2021 1558 by Zadie Rhine, RN ?Outcome: Progressing ?  ?Problem: Health Behavior/Discharge Planning: ?Goal: Ability to manage health-related needs will improve ?11/22/2021 1620 by Zadie Rhine, RN ?Outcome: Progressing ?11/22/2021 1558 by Zadie Rhine, RN ?Outcome: Progressing ?  ?Problem: Clinical Measurements: ?Goal: Ability to maintain clinical measurements within normal limits will improve ?11/22/2021 1620 by Zadie Rhine, RN ?Outcome: Progressing ?11/22/2021 1558 by Zadie Rhine, RN ?Outcome: Progressing ?Goal: Will remain free from infection ?Outcome: Progressing ?Goal: Diagnostic test results will improve ?Outcome: Progressing ?  ?

## 2021-11-22 NOTE — Progress Notes (Signed)
? ?                                                                                                                                                     ?                                                   ?Daily Progress Note  ? ?Patient Name: Cynthia [name]       Date: 11/22/2021 ?DOB: Aug 31, 1981  Age: 41 y.o. MRN#: [id number] ?Attending Physician: Elmarie Shiley, MD ?Primary Care Physician: [facility] ?Admit Date: 11/18/2021 ? ?Reason for Consultation/Follow-up: Pain control ? ?Subjective: ?I saw and examined Cynthia [name] today.  She was lying in bed in no distress at time of my encounter, however, she had just gotten a dose of IV Dilaudid.  She tells me today that increase in MS Contin and getting frequent IV Dilaudid has been improving her pain. ? ?MAR reviewed and she has had 2 doses of 45 mg of MS Contin as well as  one 30 mg dose of MS Contin in the last 24 hours.  In addition to this she has had 8 mg of IV dilaudid.  As noted, however, she feels that her pain has been getting a little bit better. ? ?Length of Stay: 3 ? ?Current Medications: ?Scheduled Meds:  ? amoxicillin-clavulanate  1 tablet Oral Q12H  ? Chlorhexidine Gluconate Cloth  6 each Topical Daily  ? enoxaparin (LOVENOX) injection  50 mg Subcutaneous Q12H  ? feeding supplement  237 mL Oral BID BM  ? morphine  60 mg Oral Q8H  ? pantoprazole (PROTONIX) IV  40 mg Intravenous Q12H  ? sodium chloride flush  10-40 mL Intracatheter Q12H  ? Vitamin D (Ergocalciferol)  50,000 Units Oral Q7 days  ? ? ?Continuous Infusions: ? lactated ringers with KCl/Additives Pediatric custom IV fluid 75 mL/hr at 11/21/21 1656  ? ? ?PRN Meds: ?dicyclomine, HYDROmorphone (DILAUDID) injection, HYDROmorphone, naLOXone (NARCAN)  injection, sodium chloride flush ? ?Physical Exam         ?General: Alert, awake, Thin. ?HEENT: No bruits, no goiter, no JVD ?Heart: Regular rate and rhythm. No murmur appreciated. ?Lungs: Good air movement, clear ?Abdomen: Soft   ?Ext:  No significant edema ?Skin: Warm and dry ? ?Vital Signs: BP (!) 141/79 (BP Location: Left Arm)   Pulse (!) 54   Temp 98 ?F (36.7 ?C) (Oral)   Resp 20   Ht '5\' 5"'$  (1.651 m)   Wt 52.6 kg   LMP  (LMP Unknown)   SpO2 99%   BMI 19.30 kg/m?  ?SpO2: SpO2: 99 % ?O2 Device: O2 Device: Room Air ?O2 Flow Rate:  O2 Flow Rate (L/min): 1 L/min ? ?Intake/output summary:  ?Intake/Output Summary (Last 24 hours) at 11/22/2021 1301 ?Last data filed at 11/22/2021 0800 ?Gross per 24 hour  ?Intake 2522.58 ml  ?Output 1150 ml  ?Net 1372.58 ml  ? ?LBM: Last BM Date : 11/19/21 ?Baseline Weight: Weight: 52.6 kg ?Most recent weight: Weight: 52.6 kg ? ?     ?Palliative Assessment/Data: ? ? ? ?Flowsheet Rows   ? ?Flowsheet Row Most Recent Value  ?Intake Tab   ?Referral Department Hospitalist  ?Unit at Time of Referral Med/Surg Unit  ?Palliative Care Primary Diagnosis Cancer  ?Date Notified 11/20/21  ?Palliative Care Type New Palliative care  ?Reason for referral Pain  ?Date of Admission 11/18/21  ?Date first seen by Palliative Care 11/21/21  ?# of days Palliative referral response time 1 Day(s)  ?# of days IP prior to Palliative referral 2  ?Clinical Assessment   ?Palliative Performance Scale Score 50%  ?Psychosocial & Spiritual Assessment   ?Palliative Care Outcomes   ?Patient/Family meeting held? No  ? ?  ? ? ?Patient Active Problem List  ? Diagnosis Date Noted  ? Epigastric abdominal pain 11/19/2021  ? Hypokalemia 10/23/2021  ? Pancreatic adenocarcinoma ([place]) 12/10/2020  ? History of pulmonary embolism 11/30/2020  ? Vitamin D deficiency 11/21/2021  ? Protein-calorie malnutrition, severe 11/20/2021  ? Abdominal pain 11/19/2021  ? Liver lesion 10/25/2021  ? Ascites 10/25/2021  ? Lymph node enlargement 10/25/2021  ? Cancer related pain 10/24/2021  ? Intractable abdominal pain 10/23/2021  ? Moderate protein malnutrition ([place]) 10/23/2021  ? Normocytic anemia 10/23/2021  ? Genetic testing 01/14/2021  ? Family history of gastric cancer  12/23/2020  ? Family history of lung cancer 12/23/2020  ? Obstructive jaundice 11/30/2020  ? Pancreatic mass 11/30/2020  ? Transaminitis 11/30/2020  ? Abnormal MRI of the abdomen   ? Biliary obstruction   ? Acute pancreatitis 11/26/2020  ? Choledocholithiasis 11/25/2020  ? Cholelithiasis 11/25/2020  ? Pancreatitis 11/25/2020  ? Diarrhea 11/25/2020  ? First trimester pregnancy   ? Status post repeat low transverse cesarean section 08/03/2018  ? Pregnancy 07/18/2018  ? Sinus bradycardia 05/26/2015  ? PE (pulmonary embolism) 05/25/2015  ? Cigarette smoker 05/25/2015  ? Pulmonary emboli ([place])   ? H/O cesarean section 02/27/2015  ? ? ?Palliative Care Assessment & Plan  ? ?Patient Profile: ?41 y.o. female  with past medical history of pancreatic adenocarcinoma currently on chemotherapy complicated by biliary obstruction requiring stents, PE on Lovenox, pancreatitis who had stent exchange/ERCP at [facility] on 3/7 that revealed clogged stent with sludge and pus admitted on 11/18/2021 with worsening abdominal pain.  Palliative consulted for pain management ? ?Recommendations/Plan: ?Pain: Cancer-related.  She has had total oral morphine equivalent of 280 mg of oral morphine.  She feels the increase in her long-acting medication was beneficial.  We will plan to increase to MS Contin 60 mg 3 times per day.  Continue with IV Dilaudid for now for rescue medication but hopefully we can transition back to an oral regimen for her breakthrough medication soon. ? ?Goals of Care and Additional Recommendations: ?Limitations on Scope of Treatment: Full Scope Treatment ? ?Code Status: ? ?  ?Code Status Orders  ?(From admission, onward)  ?  ? ? ?  ? ?  Start     Ordered  ? 11/18/21 2300  Full code  Continuous       ? 11/18/21 2302  ? ?  ?  ? ?  ? ?Code Status History   ? ?  Date Active Date Inactive Code Status Order ID Comments User Context  ? 10/23/2021 0826 10/26/2021 2004 Full Code [id number]  Reubin Milan, MD ED  ? 11/30/2020 1224 12/05/2020  2147 Full Code [id number]  Norval Morton, MD ED  ? 11/25/2020 1141 11/28/2020 2040 Full Code [id number]  Harold Hedge, MD ED  ? 08/03/2018 0812 08/05/2018 1513 Full Code [id number]  Janyth Contes, MD Inpatient  ? 05/25/2015 0355 05/28/2015 1559 Full Code [id number]  Ivor Costa, MD ED  ? 02/27/2015 0603 03/01/2015 1509 Full Code [id number]  Newton Pigg, MD Inpatient  ? ?  ? ? ?Prognosis: ? Unable to determine ? ?Discharge Planning: ?Home with Home Health ? ?Care plan was discussed with patient, RN, and Dr. Tyrell Antonio ? ?Thank you for allowing the Palliative Medicine Team to assist in the care of this patient. ? ?Micheline Rough, MD ? ?Please contact Palliative Medicine Team phone at [phone] for questions and concerns.  ? ?

## 2021-11-22 NOTE — Plan of Care (Signed)

## 2021-11-22 NOTE — Consult Note (Signed)
Consultation Note Date: 11/22/2021   Patient Name: Cynthia [name]  DOB: 10-02-1980  MRN: [id number]  Age / Sex: 41 y.o., female  PCP: [facility] Referring Physician: Alba Cory, MD  Reason for Consultation: Pain control  HPI/Patient Profile: 41 y.o. female  with past medical history of pancreatic adenocarcinoma currently on chemotherapy complicated by biliary obstruction requiring stents, PE on Lovenox, pancreatitis who had stent exchange/ERCP at [facility] on 3/7 that revealed clogged stent with sludge and pus admitted on 11/18/2021 with worsening abdominal pain.  Palliative consulted for pain management  Clinical Assessment and Goals of Care: Palliative care consult received.  Chart reviewed including personal review of pertinent labs and imaging.  This included detailed review of information via care everywhere on care she has received at [facility].  I saw and examined Ms. Hartfield today.  She was sitting in bed in moderate distress at time of my encounter.  She was upright but shifting in bed and stating she is just not really comfortable.  She reports her pain is currently 8 out of 10, located on her right upper quadrant that radiates through to her back, sharp in nature, improved slightly with pain medication, no particular exacerbating factors she has found.  In talking with her, it does not appear that this pain is significantly different than pain she is experienced in the past with her cancer.  She has been getting IV Dilaudid and says that this "takes the edge off" and allows her to rest.  She follows with Dr. Yetta Barre at [facility] for palliative care and he has been managing her pain as an outpatient.  We discussed multiple regimens that she has been on in the past including use of long and short acting morphine, hydrocodone products, oxycodone products, and Dilaudid.  We discussed things that she  feels have been helpful to her and what medication she does not think of been helpful.  She reports that she had previously been on higher dose of her long-acting agent (MS Contin: 45 mg 3 times per day) and thinks that she had better control with higher dose of long-acting agent than her current regimen of MS Contin 30 mg 3 times daily with rescue Dilaudid 2 to 4 mg as needed.  We discussed use of short and long-acting medications for baseline pain control and breakthrough pain.  She thinks that she would feel better if we reincreased MS Contin back to 45 mg 3 times per day.  We also discussed judicious use of morphine if there are concerns of hepatic impairment, however, her LFTs have been decreasing since ERCP at [facility] and we discussed plan to monitor closely for adverse effects.  We also discussed opioid related constipation.  She reports is not currently a problem.  Continue bowel regimen.  SUMMARY OF RECOMMENDATIONS   -Full code/full scope -Pain: Cancer-related.  Increase MS Contin to 45 mg 3 times per day.  Case was discussed with Dr. Cooper Render by surgical team and he indicated taking her pain was more likely cancer  related than from stent concerns.  Plan to increase her long-acting medication as she is having round-the-clock pain that is uncontrolled.  Continue Dilaudid 1 mg IV every 2 hours as needed for breakthrough pain.  Depending upon how she is feeling tomorrow, we may may try to rotate back to oral rescue medication.  Code Status/Advance Care Planning: Full code      Primary Diagnoses: Present on Admission:  Epigastric abdominal pain  Hypokalemia  Abdominal pain   I have reviewed the medical record, interviewed the patient and family, and examined the patient. The following aspects are pertinent.  Past Medical History:  Diagnosis Date   CVA (cerebral vascular accident) (HCC) 2012   s/p c-section   History of pulmonary embolism    Normal pregnancy in multigravida in  third trimester 07/18/2018   Pancreatic cancer (HCC)    Pancreatitis, acute 12/04/2020   Tobacco abuse    Social History   Socioeconomic History   Marital status: Single    Spouse name: Not on file   Number of children: 9   Years of education: Not on file   Highest education level: Not on file  Occupational History   Occupation: landscaping   Tobacco Use   Smoking status: Every Day    Packs/day: 0.50    Years: 13.00    Pack years: 6.50    Types: Cigarettes   Smokeless tobacco: Never  Vaping Use   Vaping Use: Never used  Substance and Sexual Activity   Alcohol use: No    Alcohol/week: 0.0 standard drinks   Drug use: No   Sexual activity: Yes  Other Topics Concern   Not on file  Social History Narrative   Not on file   Social Determinants of Health   Financial Resource Strain: Not on file  Food Insecurity: Not on file  Transportation Needs: Not on file  Physical Activity: Not on file  Stress: Not on file  Social Connections: Not on file   Family History  Problem Relation Age of Onset   Hypertension Mother    Hypertension Father    Gastric cancer Maternal Grandmother        dx unknown age   Lung cancer Paternal Grandfather        dx after 57; smoking hx   Scheduled Meds:  Chlorhexidine Gluconate Cloth  6 each Topical Daily   enoxaparin (LOVENOX) injection  50 mg Subcutaneous Q12H   feeding supplement  237 mL Oral BID BM   morphine  45 mg Oral Q8H   pantoprazole (PROTONIX) IV  40 mg Intravenous Q12H   sodium chloride flush  10-40 mL Intracatheter Q12H   Vitamin D (Ergocalciferol)  50,000 Units Oral Q7 days   Continuous Infusions:  lactated ringers with KCl/Additives Pediatric custom IV fluid 75 mL/hr at 11/21/21 1656   piperacillin-tazobactam (ZOSYN)  IV 3.375 g (11/22/21 0514)   PRN Meds:.HYDROmorphone (DILAUDID) injection, HYDROmorphone, naLOXone (NARCAN)  injection, sodium chloride flush Medications Prior to Admission:  Prior to Admission medications    Medication Sig Start Date End Date Taking? Authorizing Provider  dicyclomine (BENTYL) 20 MG tablet Take 20 mg by mouth 2 (two) times daily as needed for spasms.   Yes [provider]  enoxaparin (LOVENOX) 80 MG/0.8ML injection Inject 80 mg into the skin daily. 05/28/21  Yes [provider]  HYDROmorphone (DILAUDID) 4 MG tablet Take 2-4 mg by mouth every 4 (four) hours as needed for moderate pain or severe pain. 10/28/21  Yes [provider]  ibuprofen (ADVIL) 200 MG tablet Take 400 mg by mouth every 6 (six) hours as needed for mild pain.   Yes [provider]  morphine (MS CONTIN) 30 MG 12 hr tablet Take 30 mg by mouth every 8 (eight) hours as needed for pain. 10/30/21 12/25/21 Yes [provider]  ondansetron (ZOFRAN-ODT) 8 MG disintegrating tablet Take 8 mg by mouth every 8 (eight) hours as needed for nausea. 07/16/21  Yes [provider]  prochlorperazine (COMPAZINE) 10 MG tablet Take 10 mg by mouth every 6 (six) hours as needed for nausea. 07/17/21  Yes [provider]  diazepam (VALIUM) 5 MG tablet Take 1 tablet (5 mg total) by mouth every 8 (eight) hours as needed for muscle spasms. Patient not taking: Reported on 11/18/2021 02/13/21 02/13/22  Elson Areas, PA-C  docusate sodium (COLACE) 100 MG capsule Take 1 capsule (100 mg total) by mouth 2 (two) times daily. Patient not taking: Reported on 11/18/2021 12/05/20   Rolly Salter, MD  lidocaine-prilocaine (EMLA) cream Apply 1 application topically as needed (for port access). 05/23/21   [provider]   No Known Allergies Review of Systems  Constitutional:  Positive for activity change, appetite change and fatigue.  Gastrointestinal:  Positive for abdominal pain.  Musculoskeletal:  Positive for back pain.  Neurological:  Positive for weakness.  Psychiatric/Behavioral:  Positive for sleep disturbance.    Physical Exam General: Alert, awake, restless from pain.  Thin HEENT: No  bruits, no goiter, no JVD Heart: Regular rate and rhythm. No murmur appreciated. Lungs: Good air movement, clear Abdomen: Soft   Ext: No significant edema Skin: Warm and dry   Vital Signs: BP (!) 141/79 (BP Location: Left Arm)   Pulse (!) 54   Temp 98 F (36.7 C) (Oral)   Resp 20   Ht 5\' 5"  (1.651 m)   Wt 52.6 kg   LMP  (LMP Unknown)   SpO2 99%   BMI 19.30 kg/m  Pain Scale: 0-10 POSS *See Group Information*: S-Acceptable,Sleep, easy to arouse Pain Score: Asleep   SpO2: SpO2: 99 % O2 Device:SpO2: 99 % O2 Flow Rate: .O2 Flow Rate (L/min): 1 L/min  IO: Intake/output summary:  Intake/Output Summary (Last 24 hours) at 11/22/2021 0759 Last data filed at 11/22/2021 0600 Gross per 24 hour  Intake 3122.58 ml  Output 4150 ml  Net -1027.42 ml    LBM: Last BM Date : 11/19/21 Baseline Weight: Weight: 52.6 kg Most recent weight: Weight: 52.6 kg     Palliative Assessment/Data:   Flowsheet Rows    Flowsheet Row Most Recent Value  Intake Tab   Referral Department Hospitalist  Unit at Time of Referral Med/Surg Unit  Palliative Care Primary Diagnosis Cancer  Date Notified 11/20/21  Palliative Care Type New Palliative care  Reason for referral Pain  Date of Admission 11/18/21  Date first seen by Palliative Care 11/21/21  # of days Palliative referral response time 1 Day(s)  # of days IP prior to Palliative referral 2  Clinical Assessment   Palliative Performance Scale Score 50%  Psychosocial & Spiritual Assessment   Palliative Care Outcomes   Patient/Family meeting held? No       Time In: 1520 Time Out: 1645 Time Total: 85 Greater than 50%  of this time was spent counseling and coordinating care related to the above assessment and plan.  Signed by: Romie Minus, MD   Please contact Palliative Medicine Team phone at [phone] for questions and concerns.  For individual  provider: See Loretha Stapler

## 2021-11-22 NOTE — Progress Notes (Signed)
[facility] Progress Note ? ? ? ?Since last GI note: ?ERCP yesterday was unsuccessful. See full report in epic. ? ?Input from her [place] noted, appreciated. ? ?Objective: ?Vital signs in last 24 hours: ?Temp:  [97.9 ?F (36.6 ?C)-98.6 ?F (37 ?C)] 98 ?F (36.7 ?C) (03/11 5784) ?Pulse Rate:  [43-58] 54 (03/11 0656) ?Resp:  [10-20] 20 (03/11 0656) ?BP: (120-196)/(79-96) 141/79 (03/11 0656) ?SpO2:  [99 %-100 %] 99 % (03/11 0656) ?Weight:  [52.6 kg] 52.6 kg (03/10 1011) ?Last BM Date : 11/19/21 ?General: alert and oriented times 3 ?Heart: regular rate and rythm ?Abdomen: soft, midl to moderately tender throughout, non-distended, normal bowel sounds ? ?Lab Results: ?Recent Labs  ?  11/20/21 ?0352 11/21/21 ?6962 11/22/21 ?0416  ?WBC 6.8 4.9 7.2  ?HGB 8.1* 8.8* 8.7*  ?PLT 361 387 394  ?MCV 79.7* 82.1 83.6  ? ?Recent Labs  ?  11/20/21 ?0352 11/21/21 ?9528 11/22/21 ?0416  ?NA 134* 133* 134*  ?K 2.9* 4.0 4.0  ?CL 101 100 97*  ?CO2 '26 26 26  '$ ?GLUCOSE 102* 81 82  ?BUN <5* <5* <5*  ?CREATININE 0.37* 0.35* 0.36*  ?CALCIUM 7.8* 8.1* 8.5*  ? ?Recent Labs  ?  11/20/21 ?0352 11/22/21 ?0416  ?PROT 5.1* 5.2*  ?ALBUMIN 2.1* 2.3*  ?AST 30 15  ?ALT 47* 30  ?ALKPHOS 617* 527*  ?BILITOT 1.4* 1.1  ? ?Medications: ?Scheduled Meds: ? Chlorhexidine Gluconate Cloth  6 each Topical Daily  ? enoxaparin (LOVENOX) injection  50 mg Subcutaneous Q12H  ? feeding supplement  237 mL Oral BID BM  ? morphine  45 mg Oral Q8H  ? pantoprazole (PROTONIX) IV  40 mg Intravenous Q12H  ? sodium chloride flush  10-40 mL Intracatheter Q12H  ? Vitamin D (Ergocalciferol)  50,000 Units Oral Q7 days  ? ?Continuous Infusions: ? lactated ringers with KCl/Additives Pediatric custom IV fluid 75 mL/hr at 11/21/21 1656  ? piperacillin-tazobactam (ZOSYN)  IV 3.375 g (11/22/21 0514)  ? ?PRN Meds:.HYDROmorphone (DILAUDID) injection, HYDROmorphone, naLOXone (NARCAN)  injection, sodium chloride flush ? ?Assessment/Plan: ?41 y.o. female with locally  advanced, possibly metastatic pancreatic adenocarcinoma ? ?First, she clearly had purulent cholangitis that seem to have been well treated by [facility] ERCP 4 or 5 days ago (removed occluded covered SEMS and replaced with a new covered SEMS). Her LFTs are nearly completely normalized and she's had no fevers.  She is on Day #4 of IV Zosyn and I think it is safe to chang her to oral abx today and then complete another 5 days (augmentin, I will order).   ? ?Second, it is far from clear if she has a problem with her GB. I tried to remove the covered biliary stent yesterday on the idea that it could be occluding her cystic duct causing GB related pain but the ERCP was not successful.  I agree with her medical oncologist that we should avoid GB interventions unless much more certainly about GB disease.  ? ?Lastly, I agree that the focus needs to be on keeping her pain under control. I recommend putting her on all her previous oral pain meds and then going from there, titrating up as needed. I would defer to primary team, palliative med on this however. ? ?We are moving to standby for now.  Please call or page with any further questions or concerns. ? ? ?[name], MD  11/22/2021, 8:15 AM ?[facility] ?Pager [phone]) [phone] ? ? ? ?

## 2021-11-22 NOTE — Progress Notes (Signed)
Pt continues to c/o increase pain--abd to right flank radiating around to her back. Pain med administered as ordered, one episode of pain required writer to given pain medicine 30 minutes early (Dilaudid 1 mg). Pt asleep within 45 minutes to one hour follow up. Pt request writer to contact MD to discuss pain, MD increased pain med instructed pt that long acting will take longer to feel effective relief, however, pt continues to have breakthrough pain and abdominal spasms requiring PRN Dilaudid q 2hrs. Medicated administered as scheduled, MD updated/made aware. Will report to oncoming RN. SRP, RN ?

## 2021-11-22 NOTE — Progress Notes (Signed)
?Progress Note ? ? ?Patient: Cynthia [name] CVE:[id number] DOB: [birth year] DOA: 11/18/2021     3 ?DOS: the patient was seen and examined on 11/22/2021 ?  ?Brief hospital course: ?41 year old past medical history significant for pancreatic adenocarcinoma currently on chemotherapy complicated by biliary obstruction requiring stents, PE on Lovenox, pancreatitis.  Recently admitted 10/23/2021 for abdominal pain and treated for acute pancreatitis.  Patient had ERCP done at [facility]  the day of admission which revealed totally occluded CBD stent which was removed.  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  And treated with placement of a new a stent.  Ascending cholangitis was present and the biliary tree was swept with removal of both stone and sludge.  Patient was discharged with 5 days course of ciprofloxacin.  She presents to the ED complaining of worsening pain. ? ?CT showed common bile duct stent in place.  Small amount of pneumobilia.  New mild periportal edema of uncertain  etiology.  Questionable mild gallbladder wall edema.  Hypodense hepatic lesion has increased in size and single ?Small hepatic lesion finding concerning for worsening metastatic disease.  ?Mild wall thickening of the proximal colon worrisome for nonspecific colitis. ? ?Underwent ERCP 3/10, scope couldn't past distal duodenal bulb.  ?Case was discussed with patient primary Oncology at [facility], he think patient pain is related to malignancy, not related to Gallbladder or stent. Palliative care consulted. MS contin increase to 45 mg. Patient report improvement of pain.  ? ?Assessment and Plan: ?* Epigastric abdominal pain ?-Patient with history of pancreatic adenocarcinoma complicated by biliary obstruction requiring stent. ERCP done at [facility] on 11/18/2021 revealed totally occluded CBD stent which was removed.  There was severe biliary stricture in the lower third of the main bile duct which was  malignant appearing and treated with placement of a new stent. Ascending cholangitis was present and the biliary tree was swept with removal of pus and sludge.   ?-CT done after ERCP showing common bile duct stent in place. Questionable mild gallbladder wall edema.  Hypodense hepatic lesion has increased in size and single new small hepatic lesion, findings concerning for worsening metastatic disease.  ?-GI consulted and recommended HIDA scan initially. Patient refuse HIDA scan does not want to be off pain meds. ?-Surgery consulted, recommended continue with IV antibiotics. If no improvement might need cholecystostomy tube.  ?-Dilaudid as needed for pain. Resume home MS contin.  ?-IV fluid hydration ?-Blood culture no growth to date.  ?-Monitor lipase and LFTs. LFT trending down.  ?-IV protonix. IV Zosyn.  ?-Repeated US : Layering sludge in the gallbladder with gallbladder wall ?thickening and positive sonographic Murphy sign. Biliary duct stent in place. If there is high clinical concern for biliary ductal obstruction, HIDA scan could be obtained for further evaluation. Gallbladder sludge and wall thickening could be secondary to biliary stasis/reactive. ?Underwent to ERCP 3/10 Dr Ardis Hughs couldn't advance  duodenoscope past distal duodenal bulb. ?Case was discussed with patient oncologist from [facility], he think patient pain is related to cancer, not related to stent or gallbladder. Plan for medical management and pain controlled  ?Palliative consulted to assist with pain management. MS contin was increased to 45 mg TID>  ? ? ?Hypokalemia ?Hypomagnesemia: replete IV>  ?Potassium replaced.  ? ? ? ?Pancreatic adenocarcinoma ([place]) ?Follows at [facility] and currently on chemotherapy.  CT showing findings concerning for worsening metastatic disease. ?-Outpatient oncology follow-up ? ?History of pulmonary embolism ?-Contiue with Lovenox ? ?  Vitamin D deficiency ?Started supplement.  ? ?Protein-calorie malnutrition, severe ?Continue  with IV fluids.  ?Tolerating diet.  ? ? ? ? ?  ? ?Subjective: she is alert, appears more comfortable. Pain controlled is better.  ? ?Physical Exam: ?Vitals:  ? 11/21/21 1307 11/21/21 1342 11/21/21 2033 11/22/21 0656  ?BP: (!) 192/91 (!) 165/81 120/79 (!) 141/79  ?Pulse: (!) 43 (!) 47 (!) 58 (!) 54  ?Resp: '14 14  20  '$ ?Temp:  97.9 ?F (36.6 ?C) 98.6 ?F (37 ?C) 98 ?F (36.7 ?C)  ?TempSrc:  Oral Oral Oral  ?SpO2: 100% 100% 99% 99%  ?Weight:      ?Height:      ? ?General; NAD ?Lung; CTA ?Abdomen; soft nt, nd ?Extremities; no edema ? ?Data Reviewed: ? ?Bmet and LFT reviewed. ? ?Family Communication: care discussed with patient.  ? ?Disposition: ?Status is: Inpatient ?Remains inpatient appropriate because: for pain management.  ? Planned Discharge Destination: Home ? ? ? ?Time spent: 45 minutes ? ?Author: ?Elmarie Shiley, MD ?11/22/2021 1:17 PM ? ?For on call review [email or web address].  ?

## 2021-11-23 DIAGNOSIS — R1013 Epigastric pain: Secondary | ICD-10-CM | POA: Diagnosis not present

## 2021-11-23 DIAGNOSIS — G893 Neoplasm related pain (acute) (chronic): Secondary | ICD-10-CM | POA: Diagnosis not present

## 2021-11-23 DIAGNOSIS — Z515 Encounter for palliative care: Secondary | ICD-10-CM | POA: Diagnosis not present

## 2021-11-23 LAB — CBC
HCT: 27.2 % — ABNORMAL LOW (ref 36.0–46.0)
Hemoglobin: 9.3 g/dL — ABNORMAL LOW (ref 12.0–15.0)
MCH: 28.5 pg (ref 26.0–34.0)
MCHC: 34.2 g/dL (ref 30.0–36.0)
MCV: 83.4 fL (ref 80.0–100.0)
Platelets: 408 10*3/uL — ABNORMAL HIGH (ref 150–400)
RBC: 3.26 MIL/uL — ABNORMAL LOW (ref 3.87–5.11)
RDW: 14.1 % (ref 11.5–15.5)
WBC: 5.2 10*3/uL (ref 4.0–10.5)
nRBC: 0 % (ref 0.0–0.2)

## 2021-11-23 LAB — COMPREHENSIVE METABOLIC PANEL
ALT: 25 U/L (ref 0–44)
AST: 14 U/L — ABNORMAL LOW (ref 15–41)
Albumin: 2.8 g/dL — ABNORMAL LOW (ref 3.5–5.0)
Alkaline Phosphatase: 486 U/L — ABNORMAL HIGH (ref 38–126)
Anion gap: 6 (ref 5–15)
BUN: <5 mg/dL — ABNORMAL LOW (ref 6–20)
CO2: 25 mmol/L (ref 22–32)
Calcium: 8.3 mg/dL — ABNORMAL LOW (ref 8.9–10.3)
Chloride: 103 mmol/L (ref 98–111)
Creatinine, Ser: 0.31 mg/dL — ABNORMAL LOW (ref 0.44–1.00)
GFR, Estimated: >60 mL/min (ref >60)
Glucose, Bld: 133 mg/dL — ABNORMAL HIGH (ref 70–99)
Potassium: 3.8 mmol/L (ref 3.5–5.1)
Sodium: 134 mmol/L — ABNORMAL LOW (ref 135–145)
Total Bilirubin: 0.9 mg/dL (ref 0.3–1.2)
Total Protein: 5.8 g/dL — ABNORMAL LOW (ref 6.5–8.1)

## 2021-11-23 LAB — CULTURE, BLOOD (ROUTINE X 2)
Culture: NO GROWTH
Special Requests: ADEQUATE

## 2021-11-23 LAB — ZINC: Zinc: 58 ug/dL (ref 44–115)

## 2021-11-23 MED ORDER — SENNOSIDES-DOCUSATE SODIUM 8.6-50 MG PO TABS
1.0000 | ORAL_TABLET | Freq: Two times a day (BID) | ORAL | Status: DC
  Administered 2021-11-23 – 2021-11-26 (×7): 1 via ORAL
  Filled 2021-11-23 (×7): qty 1

## 2021-11-23 MED ORDER — FENTANYL CITRATE PF 50 MCG/ML IJ SOSY
75.0000 ug | PREFILLED_SYRINGE | INTRAMUSCULAR | Status: DC | PRN
  Administered 2021-11-23 – 2021-11-26 (×27): 75 ug via INTRAVENOUS
  Filled 2021-11-23 (×28): qty 2

## 2021-11-23 MED ORDER — PANTOPRAZOLE SODIUM 40 MG PO TBEC
40.0000 mg | DELAYED_RELEASE_TABLET | Freq: Two times a day (BID) | ORAL | Status: DC
  Administered 2021-11-23 – 2021-11-26 (×7): 40 mg via ORAL
  Filled 2021-11-23 (×7): qty 1

## 2021-11-23 MED ORDER — POLYETHYLENE GLYCOL 3350 17 G PO PACK
17.0000 g | PACK | Freq: Two times a day (BID) | ORAL | Status: DC
  Administered 2021-11-23 – 2021-11-25 (×6): 17 g via ORAL
  Filled 2021-11-23 (×6): qty 1

## 2021-11-23 NOTE — Progress Notes (Signed)
? ?                                                                                                                                                     ?                                                   ?Daily Progress Note  ? ?Patient Name: Cynthia [name]       Date: 11/23/2021 ?DOB: 1981/04/22  Age: 41 y.o. MRN#: [id number] ?Attending Physician: Cynthia Shiley, MD ?Primary Care Physician: [facility] ?Admit Date: 11/18/2021 ? ?Reason for Consultation/Follow-up: Pain control ? ?Subjective: ?I saw and examined Cynthia [name] today.  She was lying in bed and reports that her pain is not well controlled.  She tells me that she does not think the IV Dilaudid is really helping and asked about trying fentanyl, which she had whenever she was pregnant, and felt that it did a lot better than Dilaudid is currently doing for her.  We discussed plan to trial IV fentanyl and if this does much better for her, we may consider rotating her long-acting medication to a fentanyl patch. ? ?MAR reviewed and she has had 3 doses of 60 mg of MS Contin.  In addition to this she has had 9 mg of IV dilaudid.  Total oral morphine equivalent of 360 mg of oral morphine. ? ?Length of Stay: 4 ? ?Current Medications: ?Scheduled Meds:  ? amoxicillin-clavulanate  1 tablet Oral Q12H  ? Chlorhexidine Gluconate Cloth  6 each Topical Daily  ? enoxaparin (LOVENOX) injection  50 mg Subcutaneous Q12H  ? feeding supplement  237 mL Oral BID BM  ? morphine  60 mg Oral Q8H  ? pantoprazole  40 mg Oral BID  ? polyethylene glycol  17 g Oral BID  ? senna-docusate  1 tablet Oral BID  ? sodium chloride flush  10-40 mL Intracatheter Q12H  ? Vitamin D (Ergocalciferol)  50,000 Units Oral Q7 days  ? ? ?Continuous Infusions: ? lactated ringers with KCl/Additives Pediatric custom IV fluid 75 mL/hr at 11/23/21 1648  ? ? ?PRN Meds: ?dicyclomine, fentaNYL (SUBLIMAZE) injection, HYDROmorphone, naLOXone (NARCAN)  injection, sodium chloride flush ? ?Physical Exam          ?General: Alert, awake, Thin. ?HEENT: No bruits, no goiter, no JVD ?Heart: Regular rate and rhythm. No murmur appreciated. ?Lungs: Good air movement, clear ?Abdomen: Soft   ?Ext: No significant edema ?Skin: Warm and dry ? ?Vital Signs: BP 132/78 (BP Location: Left Arm)   Pulse (!) 57   Temp 98.1 ?F (36.7 ?C) (Oral)   Resp 16   Ht '5\' 5"'$  (1.651 m)  Wt 52.6 kg   SpO2 100%   BMI 19.30 kg/m?  ?SpO2: SpO2: 100 % ?O2 Device: O2 Device: Room Air ?O2 Flow Rate: O2 Flow Rate (L/min): 1 L/min ? ?Intake/output summary:  ?Intake/Output Summary (Last 24 hours) at 11/23/2021 2147 ?Last data filed at 11/23/2021 1508 ?Gross per 24 hour  ?Intake 1700.19 ml  ?Output 100 ml  ?Net 1600.19 ml  ? ? ?LBM: Last BM Date : 11/17/21 ?Baseline Weight: Weight: 52.6 kg ?Most recent weight: Weight: 52.6 kg ? ?     ?Palliative Assessment/Data: ? ? ? ?Flowsheet Rows   ? ?Flowsheet Row Most Recent Value  ?Intake Tab   ?Referral Department Hospitalist  ?Unit at Time of Referral Med/Surg Unit  ?Palliative Care Primary Diagnosis Cancer  ?Date Notified 11/20/21  ?Palliative Care Type New Palliative care  ?Reason for referral Pain  ?Date of Admission 11/18/21  ?Date first seen by Palliative Care 11/21/21  ?# of days Palliative referral response time 1 Day(s)  ?# of days IP prior to Palliative referral 2  ?Clinical Assessment   ?Palliative Performance Scale Score 50%  ?Psychosocial & Spiritual Assessment   ?Palliative Care Outcomes   ?Patient/Family meeting held? No  ? ?  ? ? ?Patient Active Problem List  ? Diagnosis Date Noted  ? Epigastric abdominal pain 11/19/2021  ? Hypokalemia 10/23/2021  ? Pancreatic adenocarcinoma ([place]) 12/10/2020  ? History of pulmonary embolism 11/30/2020  ? Vitamin D deficiency 11/21/2021  ? Protein-calorie malnutrition, severe 11/20/2021  ? Abdominal pain 11/19/2021  ? Liver lesion 10/25/2021  ? Ascites 10/25/2021  ? Lymph node enlargement 10/25/2021  ? Cancer related pain 10/24/2021  ? Intractable abdominal pain  10/23/2021  ? Moderate protein malnutrition ([place]) 10/23/2021  ? Normocytic anemia 10/23/2021  ? Genetic testing 01/14/2021  ? Family history of gastric cancer 12/23/2020  ? Family history of lung cancer 12/23/2020  ? Obstructive jaundice 11/30/2020  ? Pancreatic mass 11/30/2020  ? Transaminitis 11/30/2020  ? Abnormal MRI of the abdomen   ? Biliary obstruction   ? Acute pancreatitis 11/26/2020  ? Choledocholithiasis 11/25/2020  ? Cholelithiasis 11/25/2020  ? Pancreatitis 11/25/2020  ? Diarrhea 11/25/2020  ? First trimester pregnancy   ? Status post repeat low transverse cesarean section 08/03/2018  ? Pregnancy 07/18/2018  ? Sinus bradycardia 05/26/2015  ? PE (pulmonary embolism) 05/25/2015  ? Cigarette smoker 05/25/2015  ? Pulmonary emboli ([place])   ? H/O cesarean section 02/27/2015  ? ? ?Palliative Care Assessment & Plan  ? ?Patient Profile: ?41 y.o. female  with past medical history of pancreatic adenocarcinoma currently on chemotherapy complicated by biliary obstruction requiring stents, PE on Lovenox, pancreatitis who had stent exchange/ERCP at [facility] on 3/7 that revealed clogged stent with sludge and pus admitted on 11/18/2021 with worsening abdominal pain.  Palliative consulted for pain management ? ?Recommendations/Plan: ?Pain: Cancer-related.  She has had total oral morphine equivalent of 360 mg of oral morphine.  Continue MS Contin 60 mg 3 times daily.  We will rotate from Dilaudid to fentanyl for short acting rescue medication.  Ordered fentanyl IV 75 mcg every 2 hours as needed for breakthrough pain.  If she does well with this, would consider rotation from [place] Contin to fentanyl patch for long-acting agent.  If she has no improvement, I will plan to reach out to her palliative care provider at [facility] in order to collaborate on regimen (? Methadone).  Her case is very emotionally distressing and I do think there is probably a large psychosocial component driving  her total pain. ? ?Goals of Care and Additional  Recommendations: ?Limitations on Scope of Treatment: Full Scope Treatment ? ?Code Status: ? ?  ?Code Status Orders  ?(From admission, onward)  ?  ? ? ?  ? ?  Start     Ordered  ? 11/18/21 2300  Full code  Continuous       ? 11/18/21 2302  ? ?  ?  ? ?  ? ?Code Status History   ? ? Date Active Date Inactive Code Status Order ID Comments User Context  ? 10/23/2021 0826 10/26/2021 2004 Full Code [id number]  Reubin Milan, MD ED  ? 11/30/2020 1224 12/05/2020 2147 Full Code [id number]  Norval Morton, MD ED  ? 11/25/2020 1141 11/28/2020 2040 Full Code [id number]  Harold Hedge, MD ED  ? 08/03/2018 0812 08/05/2018 1513 Full Code [id number]  Janyth Contes, MD Inpatient  ? 05/25/2015 0355 05/28/2015 1559 Full Code [id number]  Ivor Costa, MD ED  ? 02/27/2015 0603 03/01/2015 1509 Full Code [id number]  Newton Pigg, MD Inpatient  ? ?  ? ? ?Prognosis: ? Unable to determine ? ?Discharge Planning: ?Home with Home Health ? ?Care plan was discussed with patient, RN, and Dr. Tyrell Antonio ? ?Thank you for allowing the Palliative Medicine Team to assist in the care of this patient. ? ?Micheline Rough, MD ? ?Please contact Palliative Medicine Team phone at [phone] for questions and concerns.  ? ?

## 2021-11-23 NOTE — Progress Notes (Signed)
?Progress Note ? ? ?Patient: Cynthia [name] TML:[id number] DOB: 06/16/1981 DOA: 11/18/2021     4 ?DOS: the patient was seen and examined on 11/23/2021 ?  ?Brief hospital course: ?41 year old past medical history significant for pancreatic adenocarcinoma currently on chemotherapy complicated by biliary obstruction requiring stents, PE on Lovenox, pancreatitis.  Recently admitted 10/23/2021 for abdominal pain and treated for acute pancreatitis.  Patient had ERCP done at [facility]  the day of admission which revealed totally occluded CBD stent which was removed.  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  And treated with placement of a new a stent.  Ascending cholangitis was present and the biliary tree was swept with removal of both stone and sludge.  Patient was discharged with 5 days course of ciprofloxacin.  She presents to the ED complaining of worsening pain. ? ?CT showed common bile duct stent in place.  Small amount of pneumobilia.  New mild periportal edema of uncertain  etiology.  Questionable mild gallbladder wall edema.  Hypodense hepatic lesion has increased in size and single ?Small hepatic lesion finding concerning for worsening metastatic disease.  ?Mild wall thickening of the proximal colon worrisome for nonspecific colitis. ? ?Underwent ERCP 3/10, scope couldn't past distal duodenal bulb.  ?Case was discussed with patient primary Oncology at [facility], he think patient pain is related to malignancy, not related to Gallbladder or stent.  ? ?Palliative care consulted. MS contin increase to 60 mg Q 8 hours, Oral dilaudid, transition to IV fentanyl.  ? ?Assessment and Plan: ?* Epigastric abdominal pain ?-Patient with history of pancreatic adenocarcinoma complicated by biliary obstruction requiring stent. ERCP done at [facility] on 11/18/2021 revealed totally occluded CBD stent which was removed.  There was severe biliary stricture in the lower third of the main bile  duct which was malignant appearing and treated with placement of a new stent. Ascending cholangitis was present and the biliary tree was swept with removal of pus and sludge.   ?-CT done after ERCP showing common bile duct stent in place. Questionable mild gallbladder wall edema.  Hypodense hepatic lesion has increased in size and single new small hepatic lesion, findings concerning for worsening metastatic disease.  ?-GI consulted and recommended HIDA scan initially. Patient refuse HIDA scan does not want to be off pain meds. ?-Surgery consulted, recommended continue with IV antibiotics. If no improvement might need cholecystostomy tube.  ?-Dilaudid as needed for pain. Resume home MS contin.  ?-IV fluid hydration ?-Blood culture no growth to date.  ?-Monitor lipase and LFTs. LFT trending down.  ?-IV protonix. IV Zosyn.  ?-Repeated US : Layering sludge in the gallbladder with gallbladder wall ?thickening and positive sonographic Murphy sign. Biliary duct stent in place. If there is high clinical concern for biliary ductal obstruction, HIDA scan could be obtained for further evaluation. Gallbladder sludge and wall thickening could be secondary to biliary stasis/reactive. ?Underwent to ERCP 3/10 Dr Ardis Hughs couldn't advance  duodenoscope past distal duodenal bulb. ?Case was discussed with patient oncologist from [facility], he think patient pain is related to cancer, not related to stent or gallbladder.  ?Plan for medical management and pain controlled  ?Palliative consulted to assist with pain management. MS contin was increased to '60mg'$  TID> IV dilaudid change to IV fentanyl.  ? ? ?Hypokalemia ?Hypomagnesemia: replete IV>  ?Potassium replaced.  ? ? ? ?Pancreatic adenocarcinoma ([place]) ?Follows at [facility] and currently on chemotherapy.  CT showing findings concerning for worsening metastatic disease. ?-  Outpatient oncology follow-up ? ?History of pulmonary embolism ?-Contiue with Lovenox ? ?Vitamin D deficiency ?Started supplement.   ? ?Protein-calorie malnutrition, severe ?Continue with IV fluids.  ?Tolerating diet.  ? ? ? ? ?  ? ?Subjective: she is still having pain. Not controlled. She report she has done better on fentanyl in the past  ? ?Physical Exam: ?Vitals:  ? 11/23/21 0642 11/23/21 0900 11/23/21 1224 11/23/21 1326  ?BP: 128/82   107/71  ?Pulse: (!) 53   65  ?Resp: '16 14 14 20  '$ ?Temp: 98.4 ?F (36.9 ?C)   98.7 ?F (37.1 ?C)  ?TempSrc: Oral   Oral  ?SpO2: 100%   100%  ?Weight:      ?Height:      ? ?General; NAD ?CVS; S 1, S 2 RRR ?Lungs; CTA ?Abdomen; soft nt, nd ? ?Data Reviewed: ? ?Cbc and cmet reviewed.  ? ?Family Communication: care discussed with patient.  ? ?Disposition: ?Status is: Inpatient ?Remains inpatient appropriate because: admitted with abdominal pain, pain management.  ? Planned Discharge Destination: Home ? ? ? ?Time spent: 45 minutes ? ?Author: ?Elmarie Shiley, MD ?11/23/2021 4:14 PM ? ?For on call review [email or web address].  ?

## 2021-11-23 NOTE — Progress Notes (Signed)
End of shift ? ?Pt c/o 8 out of 10 abd pain until Dr changed dilaudid to fentanyl 10mg.  Pt requesting it q 2, will state that pain decreases to a 6 and then go to sleep.  When she awakes she requests more fentanyl.  Pt also receiving '60mg'$  morphine PO scheduled q8.  Does not appear to be in distress.  ?

## 2021-11-23 NOTE — Plan of Care (Signed)

## 2021-11-23 NOTE — Progress Notes (Signed)
PHARMACIST - PHYSICIAN COMMUNICATION ? ?DR:   Tyrell Antonio ? ?CONCERNING: IV to Oral Route Change Policy ? ?RECOMMENDATION: ?This patient is receiving pantoprazole by the intravenous route.  Based on criteria approved by the Pharmacy and Therapeutics Committee, the intravenous medication(s) is/are being converted to the equivalent oral dose form(s). ? ? ?DESCRIPTION: ?These criteria include: ?The patient is eating (either orally or via tube) and/or has been taking other orally administered medications for a least 24 hours ?The patient has no evidence of active gastrointestinal bleeding or impaired GI absorption (gastrectomy, short bowel, patient on TNA or NPO). ? ?If you have questions about this conversion, please contact the Pharmacy Department  ?'[]'$   [phone] )  [facility] ?'[]'$   [phone] )  [facility] ?'[]'$   [phone] )  [facility] ?'[]'$   [phone] )  [facility] ?'[x]'$   [phone] )  [facility]  ? ?Dimple Nanas, [facility] ?11/23/2021 8:47 AM ? ?

## 2021-11-24 DIAGNOSIS — G893 Neoplasm related pain (acute) (chronic): Secondary | ICD-10-CM | POA: Diagnosis not present

## 2021-11-24 DIAGNOSIS — Z515 Encounter for palliative care: Secondary | ICD-10-CM | POA: Diagnosis not present

## 2021-11-24 DIAGNOSIS — R1013 Epigastric pain: Secondary | ICD-10-CM | POA: Diagnosis not present

## 2021-11-24 LAB — CULTURE, BLOOD (ROUTINE X 2)
Culture: NO GROWTH
Special Requests: ADEQUATE

## 2021-11-24 LAB — MAGNESIUM: Magnesium: 1.6 mg/dL — ABNORMAL LOW (ref 1.7–2.4)

## 2021-11-24 MED ORDER — FENTANYL 100 MCG/HR TD PT72
1.0000 | MEDICATED_PATCH | TRANSDERMAL | Status: DC
  Administered 2021-11-24: 1 via TRANSDERMAL
  Filled 2021-11-24: qty 1

## 2021-11-24 NOTE — Progress Notes (Incomplete)
Daily Progress Note   Patient Name: Cynthia [name]       Date: 11/24/2021 DOB: 11-Jan-1981  Age: 41 y.o. MRN#: [id number] Attending Physician: Elmarie Shiley, MD Primary Care Physician: [facility] Admit Date: 11/18/2021  Reason for Consultation/Follow-up: Pain control  Subjective: I saw and examined Cynthia [name] today.  She reports that pain has been better controlled since rotating to fentanyl for her rescue medication.  MAR reviewed and she has had 3 doses of 60 mg of MS Contin.  In addition to this she has had a total of 683mg of IV fentanyl.      Length of Stay: 5  Current Medications: Scheduled Meds:   amoxicillin-clavulanate  1 tablet Oral Q12H   Chlorhexidine Gluconate Cloth  6 each Topical Daily   enoxaparin (LOVENOX) injection  50 mg Subcutaneous Q12H   feeding supplement  237 mL Oral BID BM   fentaNYL  1 patch Transdermal Q72H   pantoprazole  40 mg Oral BID   polyethylene glycol  17 g Oral BID   senna-docusate  1 tablet Oral BID   sodium chloride flush  10-40 mL Intracatheter Q12H   Vitamin D (Ergocalciferol)  50,000 Units Oral Q7 days    Continuous Infusions:  lactated ringers with KCl/Additives Pediatric custom IV fluid 75 mL/hr at 11/24/21 2015    PRN Meds: dicyclomine, fentaNYL (SUBLIMAZE) injection, HYDROmorphone, naLOXone (NARCAN)  injection, sodium chloride flush  Physical Exam         General: Alert, awake, Thin. HEENT: No bruits, no goiter, no JVD Heart: Regular rate and rhythm. No murmur appreciated. Lungs: Good air movement, clear Abdomen: Soft   Ext: No significant edema Skin: Warm and dry  Vital Signs: BP 118/83 (BP Location: Right Arm)    Pulse 69    Temp 98.4 F (36.9 C) (Oral)    Resp 14    Ht '5\' 5"'$  (1.651 m)    Wt 52.6 kg     SpO2 100%    BMI 19.30 kg/m  SpO2: SpO2: 100 % O2 Device: O2 Device: Room Air O2 Flow Rate: O2 Flow Rate (L/min): 1 L/min  Intake/output summary:  Intake/Output Summary (Last 24 hours) at 11/24/2021 2328 Last data filed at 11/24/2021 2153 Gross per 24 hour  Intake 1082.21 ml  Output --  Net 1082.21 ml  LBM: Last BM Date : 11/17/21 Baseline Weight: Weight: 52.6 kg Most recent weight: Weight: 52.6 kg       Palliative Assessment/Data:    Flowsheet Rows    Flowsheet Row Most Recent Value  Intake Tab   Referral Department Hospitalist  Unit at Time of Referral Med/Surg Unit  Palliative Care Primary Diagnosis Cancer  Date Notified 11/20/21  Palliative Care Type New Palliative care  Reason for referral Pain  Date of Admission 11/18/21  Date first seen by Palliative Care 11/21/21  # of days Palliative referral response time 1 Day(s)  # of days IP prior to Palliative referral 2  Clinical Assessment   Palliative Performance Scale Score 50%  Psychosocial & Spiritual Assessment   Palliative Care Outcomes   Patient/Family meeting held? No       Patient Active Problem List   Diagnosis Date Noted   Epigastric abdominal pain 11/19/2021   Hypokalemia 10/23/2021   Pancreatic adenocarcinoma ([place]) 12/10/2020   History of pulmonary embolism 11/30/2020   Vitamin D deficiency 11/21/2021   Protein-calorie malnutrition, severe 11/20/2021   Abdominal pain 11/19/2021   Liver lesion 10/25/2021   Ascites 10/25/2021   Lymph node enlargement 10/25/2021   Cancer related pain 10/24/2021   Intractable abdominal pain 10/23/2021   Moderate protein malnutrition ([place]) 10/23/2021   Normocytic anemia 10/23/2021   Genetic testing 01/14/2021   Family history of gastric cancer 12/23/2020   Family history of lung cancer 12/23/2020   Obstructive jaundice 11/30/2020   Pancreatic mass 11/30/2020   Transaminitis 11/30/2020   Abnormal MRI of the abdomen    Biliary obstruction    Acute  pancreatitis 11/26/2020   Choledocholithiasis 11/25/2020   Cholelithiasis 11/25/2020   Pancreatitis 11/25/2020   Diarrhea 11/25/2020   First trimester pregnancy    Status post repeat low transverse cesarean section 08/03/2018   Pregnancy 07/18/2018   Sinus bradycardia 05/26/2015   PE (pulmonary embolism) 05/25/2015   Cigarette smoker 05/25/2015   Pulmonary emboli (HCC)    H/O cesarean section 02/27/2015    Palliative Care Assessment & Plan   Patient Profile: 41 y.o. female  with past medical history of pancreatic adenocarcinoma currently on chemotherapy complicated by biliary obstruction requiring stents, PE on Lovenox, pancreatitis who had stent exchange/ERCP at [facility] on 3/7 that revealed clogged stent with sludge and pus admitted on 11/18/2021 with worsening abdominal pain.  Palliative consulted for pain management  Recommendations/Plan: Pain: Cancer-related.  She has had total oral morphine equivalent of 360 mg of oral morphine.  Continue MS Contin 60 mg 3 times daily.  We will rotate from Dilaudid to fentanyl for short acting rescue medication.  Ordered fentanyl IV 75 mcg every 2 hours as needed for breakthrough pain.  If she does well with this, would consider rotation from [place] Contin to fentanyl patch for long-acting agent.  If she has no improvement, I will plan to reach out to her palliative care provider at [facility] in order to collaborate on regimen (? Methadone).  Her case is very emotionally distressing and I do think there is probably a large psychosocial component driving her total pain.  Goals of Care and Additional Recommendations: Limitations on Scope of Treatment: Full Scope Treatment  Code Status:    Code Status Orders  (From admission, onward)           Start     Ordered   11/18/21 2300  Full code  Continuous        11/18/21 2302  Code Status History     Date Active Date Inactive Code Status Order ID Comments User Context   10/23/2021 0826 10/26/2021  2004 Full Code [id number]  Reubin Milan, MD ED   11/30/2020 1224 12/05/2020 2147 Full Code [id number]  Norval Morton, MD ED   11/25/2020 1141 11/28/2020 2040 Full Code [id number]  Harold Hedge, MD ED   08/03/2018 0812 08/05/2018 1513 Full Code [id number]  Janyth Contes, MD Inpatient   05/25/2015 0355 05/28/2015 1559 Full Code [id number]  Ivor Costa, MD ED   02/27/2015 0603 03/01/2015 1509 Full Code [id number]  Newton Pigg, MD Inpatient       Prognosis:  Unable to determine  Discharge Planning: Home with [place] was discussed with patient, RN, and Dr. Tyrell Antonio  Thank you for allowing the Palliative Medicine Team to assist in the care of this patient.  Micheline Rough, MD  Please contact Palliative Medicine Team phone at [phone] for questions and concerns.

## 2021-11-24 NOTE — Progress Notes (Incomplete)
Daily Progress Note   Patient Name: Cynthia [name]       Date: 11/24/2021 DOB: 1980/09/29  Age: 41 y.o. MRN#: [id number] Attending Physician: Elmarie Shiley, MD Primary Care Physician: [facility] Admit Date: 11/18/2021  Reason for Consultation/Follow-up: Pain control  Subjective: I saw and examined Cynthia [name] today.  She reports that pain has been better controlled since rotating to fentanyl for her rescue medication.  MAR reviewed and in the last 24 hours she has had 3 doses of 60 mg of MS Contin.  In addition to this she has had a total of 655mg of IV fentanyl.    We discussed options for short and long-acting medications at length.  She tells me her preference would be to trial a fentanyl patch for long-acting agent.  I called and discussed with her outpatient palliative provider from Cyn[name]  He said that he is open to any interventions that we would like to try to see if we can better control her pain.  He and I discussed options likely being trial of fentanyl patch versus consideration for initiation of methadone.  Length of Stay: 5  Current Medications: Scheduled Meds:   amoxicillin-clavulanate  1 tablet Oral Q12H   Chlorhexidine Gluconate Cloth  6 each Topical Daily   enoxaparin (LOVENOX) injection  50 mg Subcutaneous Q12H   feeding supplement  237 mL Oral BID BM   fentaNYL  1 patch Transdermal Q72H   pantoprazole  40 mg Oral BID   polyethylene glycol  17 g Oral BID   senna-docusate  1 tablet Oral BID   sodium chloride flush  10-40 mL Intracatheter Q12H   Vitamin D (Ergocalciferol)  50,000 Units Oral Q7 days    Continuous Infusions:  lactated ringers with KCl/Additives Pediatric custom IV fluid 75 mL/hr at 11/24/21 2015     PRN Meds: dicyclomine, fentaNYL (SUBLIMAZE) injection, HYDROmorphone, naLOXone (NARCAN)  injection, sodium chloride flush  Physical Exam         General: Alert, awake, Thin. HEENT: No bruits, no goiter, no JVD Heart: Regular rate and rhythm. No murmur appreciated. Lungs: Good air movement, clear Abdomen: Soft   Ext: No significant edema Skin: Warm and dry  Vital Signs: BP 118/83 (BP Location: Right Arm)    Pulse 69    Temp  98.4 F (36.9 C) (Oral)    Resp 14    Ht '5\' 5"'$  (1.651 m)    Wt 52.6 kg    SpO2 100%    BMI 19.30 kg/m  SpO2: SpO2: 100 % O2 Device: O2 Device: Room Air O2 Flow Rate: O2 Flow Rate (L/min): 1 L/min  Intake/output summary:  Intake/Output Summary (Last 24 hours) at 11/24/2021 2328 Last data filed at 11/24/2021 2153 Gross per 24 hour  Intake 1082.21 ml  Output --  Net 1082.21 ml    LBM: Last BM Date : 11/17/21 Baseline Weight: Weight: 52.6 kg Most recent weight: Weight: 52.6 kg       Palliative Assessment/Data:    Flowsheet Rows    Flowsheet Row Most Recent Value  Intake Tab   Referral Department Hospitalist  Unit at Time of Referral Med/Surg Unit  Palliative Care Primary Diagnosis Cancer  Date Notified 11/20/21  Palliative Care Type New Palliative care  Reason for referral Pain  Date of Admission 11/18/21  Date first seen by Palliative Care 11/21/21  # of days Palliative referral response time 1 Day(s)  # of days IP prior to Palliative referral 2  Clinical Assessment   Palliative Performance Scale Score 50%  Psychosocial & Spiritual Assessment   Palliative Care Outcomes   Patient/Family meeting held? No       Patient Active Problem List   Diagnosis Date Noted   Epigastric abdominal pain 11/19/2021   Hypokalemia 10/23/2021   Pancreatic adenocarcinoma ([place]) 12/10/2020   History of pulmonary embolism 11/30/2020   Vitamin D deficiency 11/21/2021   Protein-calorie malnutrition, severe 11/20/2021   Abdominal pain 11/19/2021    Liver lesion 10/25/2021   Ascites 10/25/2021   Lymph node enlargement 10/25/2021   Cancer related pain 10/24/2021   Intractable abdominal pain 10/23/2021   Moderate protein malnutrition ([place]) 10/23/2021   Normocytic anemia 10/23/2021   Genetic testing 01/14/2021   Family history of gastric cancer 12/23/2020   Family history of lung cancer 12/23/2020   Obstructive jaundice 11/30/2020   Pancreatic mass 11/30/2020   Transaminitis 11/30/2020   Abnormal MRI of the abdomen    Biliary obstruction    Acute pancreatitis 11/26/2020   Choledocholithiasis 11/25/2020   Cholelithiasis 11/25/2020   Pancreatitis 11/25/2020   Diarrhea 11/25/2020   First trimester pregnancy    Status post repeat low transverse cesarean section 08/03/2018   Pregnancy 07/18/2018   Sinus bradycardia 05/26/2015   PE (pulmonary embolism) 05/25/2015   Cigarette smoker 05/25/2015   Pulmonary emboli (HCC)    H/O cesarean section 02/27/2015    Palliative Care Assessment & Plan   Patient Profile: 41 y.o. female  with past medical history of pancreatic adenocarcinoma currently on chemotherapy complicated by biliary obstruction requiring stents, PE on Lovenox, pancreatitis who had stent exchange/ERCP at [facility] on 3/7 that revealed clogged stent with sludge and pus admitted on 11/18/2021 with worsening abdominal pain.  Palliative consulted for pain management  Recommendations/Plan: Pain: Cancer-related.  She has had total oral morphine equivalent of 360 mg of oral morphine.  Continue MS Contin 60 mg 3 times daily.  We will rotate from Dilaudid to fentanyl for short acting rescue medication.  Ordered fentanyl IV 75 mcg every 2 hours as needed for breakthrough pain.  If she does well with this, would consider rotation from [place] Contin to fentanyl patch for long-acting agent.  If she has no improvement, I will plan to reach out to her palliative care provider at [facility] in order to collaborate on  regimen (?  Methadone).  Her case is very emotionally distressing and I do think there is probably a large psychosocial component driving her total pain.  Goals of Care and Additional Recommendations: Limitations on Scope of Treatment: Full Scope Treatment  Code Status:    Code Status Orders  (From admission, onward)           Start     Ordered   11/18/21 2300  Full code  Continuous        11/18/21 2302           Code Status History     Date Active Date Inactive Code Status Order ID Comments User Context   10/23/2021 0826 10/26/2021 2004 Full Code [id number]  Reubin Milan, MD ED   11/30/2020 1224 12/05/2020 2147 Full Code [id number]  Norval Morton, MD ED   11/25/2020 1141 11/28/2020 2040 Full Code [id number]  Harold Hedge, MD ED   08/03/2018 0812 08/05/2018 1513 Full Code [id number]  Janyth Contes, MD Inpatient   05/25/2015 0355 05/28/2015 1559 Full Code [id number]  Ivor Costa, MD ED   02/27/2015 0603 03/01/2015 1509 Full Code [id number]  Newton Pigg, MD Inpatient       Prognosis:  Unable to determine  Discharge Planning: Home with [place] was discussed with patient, RN, and Dr. Tyrell Antonio  Thank you for allowing the Palliative Medicine Team to assist in the care of this patient.  Micheline Rough, MD  Please contact Palliative Medicine Team phone at [phone] for questions and concerns.

## 2021-11-24 NOTE — Progress Notes (Signed)
Chaplain checked in on Cynthia [name] but she appeared to be in pain as she was scrunched up in her bed.  Chaplain offered support and asked her about her pain.  She wasn't sure if it was time for pain medicine or not.  Chaplain checked in with nurse upon leaving about Kenyona's appearance of being uncomfortable.   ? ?Chaplain will follow-up.  ? ? 11/24/21 1400  ?Clinical Encounter Type  ?Visited With Patient  ?Visit Type Initial  ?Referral From Palliative care team  ?Consult/Referral To Chaplain  ? ? ? ?

## 2021-11-24 NOTE — Progress Notes (Signed)
?Progress Note ? ? ?Patient: Cynthia [name] TKP:[id number] DOB: 1981/08/09 DOA: 11/18/2021     5 ?DOS: the patient was seen and examined on 11/24/2021 ?  ?Brief hospital course: ?41 year old past medical history significant for pancreatic adenocarcinoma currently on chemotherapy complicated by biliary obstruction requiring stents, PE on Lovenox, pancreatitis.  Recently admitted 10/23/2021 for abdominal pain and treated for acute pancreatitis.  Patient had ERCP done at [facility]  the day of admission which revealed totally occluded CBD stent which was removed.  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  And treated with placement of a new a stent.  Ascending cholangitis was present and the biliary tree was swept with removal of both stone and sludge.  Patient was discharged with 5 days course of ciprofloxacin.  She presents to the ED complaining of worsening pain. ? ?CT showed common bile duct stent in place.  Small amount of pneumobilia.  New mild periportal edema of uncertain  etiology.  Questionable mild gallbladder wall edema.  Hypodense hepatic lesion has increased in size and single ?Small hepatic lesion finding concerning for worsening metastatic disease.  ?Mild wall thickening of the proximal colon worrisome for nonspecific colitis. ? ?Underwent ERCP 3/10, scope couldn't past distal duodenal bulb.  ?Case was discussed with patient primary Oncology at [facility], he think patient pain is related to malignancy, not related to Gallbladder or stent.  ? ?Palliative care consulted. MS contin increase to 60 mg Q 8 hours, Oral dilaudid, transition to IV fentanyl.  ? ?Assessment and Plan: ?* Epigastric abdominal pain ?-Patient with history of pancreatic adenocarcinoma complicated by biliary obstruction requiring stent. ERCP done at [facility] on 11/18/2021 revealed totally occluded CBD stent which was removed.  There was severe biliary stricture in the lower third of the main bile  duct which was malignant appearing and treated with placement of a new stent. Ascending cholangitis was present and the biliary tree was swept with removal of pus and sludge.   ?-CT done after ERCP showing common bile duct stent in place. Questionable mild gallbladder wall edema.  Hypodense hepatic lesion has increased in size and single new small hepatic lesion, findings concerning for worsening metastatic disease.  ?-GI consulted and recommended HIDA scan initially. Patient refuse HIDA scan does not want to be off pain meds. ?-Surgery consulted, recommended continue with IV antibiotics. If no improvement might need cholecystostomy tube.  ?-Dilaudid as needed for pain. Resume home MS contin.  ?-IV fluid hydration ?-Blood culture no growth to date.  ?-Monitor lipase and LFTs. LFT trending down.  ?-IV protonix. Treated initially with IV zosyn, now on Augmentin.  ?-Repeated US: Layering sludge in the gallbladder with gallbladder wall thickening and positive sonographic Murphy sign. Biliary duct stent in place. Gallbladder sludge and wall thickening could be secondary to biliary stasis/reactive. ?-Underwent to ERCP 3/10 Dr Ardis Hughs couldn't advance  duodenoscope past distal duodenal bulb. ?Case was discussed with patient oncologist from [facility], he think patient pain is related to cancer, not related to stent malfunction  or gallbladder diseases.  ?-Plan for medical management and pain controlled  ?-Palliative consulted to assist with pain management. MS contin was increased to '60mg'$  TID> IV dilaudid change to IV fentanyl.  ?Appreciate Dr Domingo Cocking assistance.  ? ? ?Hypokalemia ?Hypomagnesemia: replete IV>  ?Potassium replaced.  ? ? ? ?Pancreatic adenocarcinoma ([place]) ?Follows at [facility] and currently on chemotherapy.  CT showing findings concerning for worsening metastatic disease. ?-Outpatient oncology follow-up ? ?History  of pulmonary embolism ?-Contiue with Lovenox ? ?Vitamin D deficiency ?Started supplement.   ? ?Protein-calorie malnutrition, severe ?Continue with IV fluids.  ?Tolerating diet.  ? ? ? ? ?  ? ?Subjective: She report abdominal pain, 6/10. Good day for her pain is 3/10  ? ?Physical Exam: ?Vitals:  ? 11/23/21 1326 11/23/21 1943 11/24/21 0420 11/24/21 1325  ?BP: 107/71 132/78 135/76 106/72  ?Pulse: 65 (!) 57 (!) 57 71  ?Resp: '20 16 16 18  '$ ?Temp: 98.7 ?F (37.1 ?C) 98.1 ?F (36.7 ?C) 98.5 ?F (36.9 ?C) 98.1 ?F (36.7 ?C)  ?TempSrc: Oral Oral Oral Oral  ?SpO2: 100% 100% 99% 100%  ?Weight:      ?Height:      ? ?General; NAD ?CVS; S 1, S 2 RRR ?Lungs; CTA ?Abdomen; soft, tender.  ? ?Data Reviewed: ? ?Cmet reviewed.  ? ?Family Communication: Care discussed with patient.  ? ?Disposition: ?Status is: Inpatient ?Remains inpatient appropriate because: for pain management.  ? Planned Discharge Destination: Home ? ? ? ?Time spent: 45 minutes ? ?Author: ?Elmarie Shiley, MD ?11/24/2021 3:49 PM ? ?For on call review [email or web address].  ?

## 2021-11-24 NOTE — Final Consult Note (Signed)
Consultant Final Sign-Off Note  ? ? ?Assessment/Final recommendations  ?Cynthia [name] is a 41 y.o. female followed by me for abdominal pain and gallbladder sludge in the setting of advanced pancreatitic cancer. She is tolerating some food. She continues to have pain but as noted by her oncologist is not new and not likely related to the gallstones and would be unlikely to benefit from cholecystostomy or other procedure on the gallbladder. ? ? ?Wound care (if applicable):  ?  ?Diet at discharge: ad lib ?  ?Activity at discharge: per primary team ?  ?Follow-up appointment:   ?  ?Pending results:  ?Unresulted Labs (From admission, onward)  ? ?  Start     Ordered  ? 11/24/21 0818  Magnesium  Once,   R       ?Question:  Specimen collection method  Answer:  IV Team=IV Team collect  ? 11/24/21 0817  ? 11/21/21 0500  Vitamin A  Tomorrow morning,   R       ?Question:  Specimen collection method  Answer:  Lab=Lab collect  ? 11/20/21 1146  ? 11/21/21 0500  Vitamin K1, Serum  Tomorrow morning,   R       ?Question:  Specimen collection method  Answer:  Lab=Lab collect  ? 11/20/21 1146  ? 11/18/21 1802  Resp Panel by RT-PCR (Flu A&B, Covid) Nasopharyngeal Swab  (Tier 2 - Symptomatic/asymptomatic)  Once,   STAT       ? 11/18/21 1801  ? ?  ?  ? ?  ? ?  ?Medication recommendations: ?  ?Other recommendations: ?  ? ?Thank you for allowing [place] to participate in the care of your patient!  Please consult [place] again if you have further needs for your patient. ? ?Arta Bruce Erikah Thumm ?11/24/2021 ?8:57 AM ? ? ? ?Subjective  ? ? ? ?Objective  ?Vital signs in last 24 hours: ?Temp:  [98.1 ?F (36.7 ?C)-98.7 ?F (37.1 ?C)] 98.5 ?F (36.9 ?C) (03/13 0420) ?Pulse Rate:  [57-65] 57 (03/13 0420) ?Resp:  [14-20] 16 (03/13 0420) ?BP: (107-135)/(71-78) 135/76 (03/13 0420) ?SpO2:  [99 %-100 %] 99 % (03/13 0420) ? ?General: ?uncomfortable ? ? ?Pertinent labs and Studies: ?Recent Labs  ?  11/21/21 ?7628 11/22/21 ?0416 11/23/21 ?1042  ?WBC 4.9 7.2 5.2  ?HGB  8.8* 8.7* 9.3*  ?HCT 25.3* 25.4* 27.2*  ? ?BMET ?Recent Labs  ?  11/22/21 ?0416 11/23/21 ?1042  ?NA 134* 134*  ?K 4.0 3.8  ?CL 97* 103  ?CO2 26 25  ?GLUCOSE 82 133*  ?BUN <5* <5*  ?CREATININE 0.36* 0.31*  ?CALCIUM 8.5* 8.3*  ? ?No results for input(s): LABURIN in the last 72 hours. ?Results for orders placed or performed during the hospital encounter of 11/18/21  ?Blood culture (routine x 2)     Status: None  ? Collection Time: 11/18/21  9:59 PM  ? Specimen: BLOOD LEFT HAND  ?Result Value Ref Range Status  ? Specimen Description   Final  ?  BLOOD LEFT HAND ?Performed at [facility], [place] [address], [place], [place] [address] ?  ? Special Requests   Final  ?  BOTTLES DRAWN AEROBIC AND ANAEROBIC Blood Culture adequate volume ?Performed at [facility], [place] [address], [place], [place] [address] ?  ? Culture   Final  ?  NO GROWTH 5 DAYS ?Performed at [facility], [place] [address], [place],  [address] ?  ? Report Status 11/23/2021 FINAL  Final  ?Blood  culture (routine x 2)     Status: None  ? Collection Time: 11/19/21  3:55 AM  ? Specimen: BLOOD RIGHT HAND  ?Result Value Ref Range Status  ? Specimen Description   Final  ?  BLOOD RIGHT HAND ?Performed at [facility], [place] [address], [place], [place] [address] ?  ? Special Requests   Final  ?  BOTTLES DRAWN AEROBIC ONLY Blood Culture adequate volume ?Performed at [facility], [place] [address] [place], [place] [address] ?  ? Culture   Final  ?  NO GROWTH 5 DAYS ?Performed at [facility], [place] [address] [place], [place] [address] ?  ? Report Status 11/24/2021 FINAL  Final  ? ? ?Imaging: ?No results found. ? ? ? ? ? ?

## 2021-11-24 NOTE — Progress Notes (Signed)
ANTICOAGULATION CONSULT NOTE - follow up ? ?Pharmacy Consult for enoxaparin ?Indication: pulmonary embolus ? ?No Known Allergies ? ?Patient Measurements: ?Height: '5\' 5"'$  (165.1 cm) ?Weight: 52.6 kg (115 lb 15.4 oz) ?IBW/kg (Calculated) : 57 ? ?Vital Signs: ?Temp: 98.5 ?F (36.9 ?C) (03/13 0420) ?Temp Source: Oral (03/13 0420) ?BP: 135/76 (03/13 0420) ?Pulse Rate: 57 (03/13 0420) ? ?Labs: ?Recent Labs  ?  11/21/21 ?8299 11/22/21 ?0416 11/23/21 ?1042  ?HGB 8.8* 8.7* 9.3*  ?HCT 25.3* 25.4* 27.2*  ?PLT 387 394 408*  ?CREATININE 0.35* 0.36* 0.31*  ? ? ? ?Estimated Creatinine Clearance: 79.2 mL/min (A) (by C-G formula based on SCr of 0.31 mg/dL (L)). ? ? ?Medical History: ?Past Medical History:  ?Diagnosis Date  ? CVA (cerebral vascular accident) [facility]) 2012  ? s/p c-section  ? History of pulmonary embolism   ? Normal pregnancy in multigravida in third trimester 07/18/2018  ? Pancreatic cancer ([place])   ? Pancreatitis, acute 12/04/2020  ? Tobacco abuse   ? ? ?Medications: Patient is prescribed enoxaparin 80 mg (1.5 mg/kg) subcutaneously once daily PTA.  ? ?Assessment: ?Pt is a 74 yoF with PMH significant for pancreatic adenocarcinoma. Pt underwent ERCP on 3/7 (yesterday) at [facility] - old occluded stent was removed and new stent was placed. Pt was discharged after procedure and presented to [facility] ED last night with abdominal pain.  ? ?Pt is on enoxaparin prior to admission for history of PE. Pharmacy consulted for enoxaparin dosing.  ? ?Discussed dosing options with Dr. Tyrell Antonio - will do 1 mg/kg subQ q12h while inpatient. Discussed timing of enoxaparin initiation s/p ERCP with Dr. Tyrell Antonio and Dr. Ardis Hughs. Enoxaparin to start 3/9 @ 0800.  ? ?Today, 11/24/21 ?-SCr low but stable. CrCl > 30 mL/min ?-Hgb slowly improving, Plt stable ?-TBW = 52 kg ? ?Goal of Therapy:  ?Monitor platelets by anticoagulation protocol: Yes ?  ?Plan:  ?Continue enoxaparin 1 mg/kg subQ q12h  ?If no further procedures need, would consider restarting patient's  home Lovenox regimen of 1.'5mg'$ /kg SQ q24 ?Monitor for signs of bleeding ? ?Kara Mead, PharmD ?11/24/2021,9:40 AM ?

## 2021-11-25 DIAGNOSIS — R1013 Epigastric pain: Secondary | ICD-10-CM | POA: Diagnosis not present

## 2021-11-25 DIAGNOSIS — C259 Malignant neoplasm of pancreas, unspecified: Secondary | ICD-10-CM | POA: Diagnosis not present

## 2021-11-25 LAB — VITAMIN A: Vitamin A (Retinoic Acid): 8 ug/dL — ABNORMAL LOW (ref 18.9–57.3)

## 2021-11-25 MED ORDER — MAGNESIUM SULFATE 2 GM/50ML IV SOLN
2.0000 g | Freq: Once | INTRAVENOUS | Status: AC
  Administered 2021-11-25: 2 g via INTRAVENOUS
  Filled 2021-11-25: qty 50

## 2021-11-25 NOTE — Progress Notes (Signed)
Patient has been resting most of the day.  Patient declined to have blinds opened, sit in recliner, be walked or wheeled around the unit, or for extra nourishment.  Encouraged and offered PO intake but patient declines; she ate very little of her food today.  Continues to need frequent pain medication. Offered patient emotional support, physical support at this time; patient denies need. Will continue to monitor. ?

## 2021-11-25 NOTE — Progress Notes (Signed)
Chaplain briefly checked in with Cynthia [name] who voiced she was in pain again.  She had just received her pain medicine and was waiting for it to help her.  Cynthia [name] voiced needing to rest.  Chaplain offered support and let her know she is there if she needs her.  Chaplain will continue to check-in. ? ? ? 11/25/21 1700  ?Clinical Encounter Type  ?Visited With Patient  ?Visit Type Follow-up  ? ? ?

## 2021-11-25 NOTE — Progress Notes (Signed)
?Progress Note ? ? ?Patient: Cynthia [name] DTO:[id number] DOB: Sep 19, 1980 DOA: 11/18/2021     6 ?DOS: the patient was seen and examined on 11/25/2021 ?  ?Brief hospital course: ?41 year old past medical history significant for pancreatic adenocarcinoma currently on chemotherapy complicated by biliary obstruction requiring stents, PE on Lovenox, pancreatitis.  Recently admitted 10/23/2021 for abdominal pain and treated for acute pancreatitis.  Patient had ERCP done at [facility]  the day of admission which revealed totally occluded CBD stent which was removed.  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  And treated with placement of a new a stent.  Ascending cholangitis was present and the biliary tree was swept with removal of both stone and sludge.  Patient was discharged with 5 days course of ciprofloxacin.  She presents to the ED complaining of worsening pain. ? ?CT showed common bile duct stent in place.  Small amount of pneumobilia.  New mild periportal edema of uncertain  etiology.  Questionable mild gallbladder wall edema.  Hypodense hepatic lesion has increased in size and single ?Small hepatic lesion finding concerning for worsening metastatic disease.  ?Mild wall thickening of the proximal colon worrisome for nonspecific colitis. ? ?Underwent ERCP 3/10, scope couldn't past distal duodenal bulb.  ?Case was discussed with patient primary Oncology at [facility], he think patient pain is related to malignancy, not related to Gallbladder or stent.  ? ?She was transition to fentanyl patch and IV fentanyl PRN> appreciate palliative care assistance with pain management. Home when pain controlled.  ? ?Assessment and Plan: ?* Epigastric abdominal pain ?-Patient with history of pancreatic adenocarcinoma complicated by biliary obstruction requiring stent. ERCP done at [facility] on 11/18/2021 revealed totally occluded CBD stent which was removed.  There was severe biliary stricture  in the lower third of the main bile duct which was malignant appearing and treated with placement of a new stent. Ascending cholangitis was present and the biliary tree was swept with removal of pus and sludge.   ?-CT done after ERCP showing common bile duct stent in place. Questionable mild gallbladder wall edema.  Hypodense hepatic lesion has increased in size and single new small hepatic lesion, findings concerning for worsening metastatic disease.  ?-GI consulted and recommended HIDA scan initially. Patient refuse HIDA scan does not want to be off pain meds. ?-Surgery consulted, recommended continue with IV antibiotics. If no improvement might need cholecystostomy tube.  ?-Dilaudid as needed for pain. Resume home MS contin.  ?-IV fluid hydration ?-Blood culture no growth to date.  ?-Monitor lipase and LFTs. LFT trending down.  ?-IV protonix. Treated initially with IV zosyn, now on Augmentin for 5 days.   ?-Repeated US: Layering sludge in the gallbladder with gallbladder wall thickening and positive sonographic Murphy sign. Biliary duct stent in place. Gallbladder sludge and wall thickening could be secondary to biliary stasis/reactive. ?-Underwent to ERCP 3/10 Dr Ardis Hughs couldn't advance  duodenoscope past distal duodenal bulb. ?Case was discussed with patient oncologist from [facility], he think patient pain is related to cancer, not related to stent malfunction  or gallbladder diseases.  ?-Plan for medical management and pain controlled  ?-Palliative consulted to assist with pain management. MS contin was change to Fentanyl Patch. Now on IV fentanyl PRN.  ? ? ?Vitamin D deficiency ?Started supplement.  ? ?Protein-calorie malnutrition, severe ?Continue with IV fluids.  ?Tolerating diet.  ? ?Hypokalemia ?Hypomagnesemia; mg 1.6 replete IV ?Potassium replaced.  ? ? ? ?Pancreatic adenocarcinoma ([place]) ?  Follows at [facility] and currently on chemotherapy.  CT showing findings concerning for worsening metastatic  disease. ?-Outpatient oncology follow-up ?She has appointment on 3/16 with her oncology.  ? ?History of pulmonary embolism ?-Contiue with Lovenox ? ? ? ? ?  ? ?Subjective: still complaining of abdominal pain, no other complaints.  ? ?Physical Exam: ?Vitals:  ? 11/24/21 1325 11/24/21 2158 11/25/21 0544 11/25/21 1421  ?BP: 106/72 118/83 132/78 122/76  ?Pulse: 71 69 (!) 55 (!) 57  ?Resp: '18 14 14 20  '$ ?Temp: 98.1 ?F (36.7 ?C) 98.4 ?F (36.9 ?C) 98 ?F (36.7 ?C) 98.4 ?F (36.9 ?C)  ?TempSrc: Oral Oral Oral Oral  ?SpO2: 100% 100% 100% 99%  ?Weight:      ?Height:      ? ?Genera; NAD ?Lungs; CTA\ ?Abdomen; soft, tender  ? ?Data Reviewed: ? ?Mg reviewed.  ? ?Family Communication: Care discussed with patient.  ? ?Disposition: ?Status is: Inpatient ?Remains inpatient appropriate because: management of pain  ? Planned Discharge Destination: Home ? ? ? ?Time spent: 45 minutes ? ?Author: ?Elmarie Shiley, MD ?11/25/2021 5:02 PM ? ?For on call review [email or web address].  ?

## 2021-11-25 NOTE — Progress Notes (Signed)
? ?                                                                                                                                                     ?                                                   ?Daily Progress Note  ? ?Patient Name: Cynthia [name]       Date: 11/25/2021 ?DOB: April 03, 1981  Age: 41 y.o. MRN#: [id number] ?Attending Physician: Elmarie Shiley, MD ?Primary Care Physician: [facility] ?Admit Date: 11/18/2021 ? ?Reason for Consultation/Follow-up: Pain control ? ?Subjective: ?I saw and examined Ms. Cynthia [name] today.  We discussed about her pain medication regimen in detail, we talked about trans dermal fentanyl taking some time before its effects can be seen, we also talked about breakthrough pain medication use.  ?  ? ? ?Length of Stay: 6 ? ?Current Medications: ?Scheduled Meds:  ? amoxicillin-clavulanate  1 tablet Oral Q12H  ? Chlorhexidine Gluconate Cloth  6 each Topical Daily  ? enoxaparin (LOVENOX) injection  50 mg Subcutaneous Q12H  ? feeding supplement  237 mL Oral BID BM  ? fentaNYL  1 patch Transdermal Q72H  ? pantoprazole  40 mg Oral BID  ? polyethylene glycol  17 g Oral BID  ? senna-docusate  1 tablet Oral BID  ? sodium chloride flush  10-40 mL Intracatheter Q12H  ? Vitamin D (Ergocalciferol)  50,000 Units Oral Q7 days  ? ? ?Continuous Infusions: ? lactated ringers with KCl/Additives Pediatric custom IV fluid 75 mL/hr at 11/24/21 2015  ? ? ?PRN Meds: ?dicyclomine, fentaNYL (SUBLIMAZE) injection, HYDROmorphone, naLOXone (NARCAN)  injection, sodium chloride flush ? ?Physical Exam         ?General: Alert, awake, Thin. ?HEENT: No bruits, no goiter, no JVD ?Heart: Regular rate and rhythm. No murmur appreciated. ?Lungs: Good air movement, clear ?Abdomen: Soft   ?Ext: No significant edema ?Skin: Warm and dry ? ?Vital Signs: BP 122/76   Pulse (!) 57   Temp 98.4 ?F (36.9 ?C) (Oral)   Resp 20   Ht '5\' 5"'$  (1.651 m)   Wt 52.6 kg   SpO2 99%   BMI 19.30 kg/m?  ?SpO2: SpO2: 99 % ?O2  Device: O2 Device: Room Air ?O2 Flow Rate: O2 Flow Rate (L/min): 1 L/min ? ?Intake/output summary:  ?Intake/Output Summary (Last 24 hours) at 11/25/2021 1617 ?Last data filed at 11/25/2021 0700 ?Gross per 24 hour  ?Intake 2529.6 ml  ?Output --  ?Net 2529.6 ml  ? ? ?LBM: Last BM Date :  (pt unsure) ?Baseline Weight: Weight: 52.6 kg ?Most recent weight: Weight: 52.6 kg ? ?     ?  Palliative Assessment/Data: ? ? ? ?Flowsheet Rows   ? ?Flowsheet Row Most Recent Value  ?Intake Tab   ?Referral Department Hospitalist  ?Unit at Time of Referral Med/Surg Unit  ?Palliative Care Primary Diagnosis Cancer  ?Date Notified 11/20/21  ?Palliative Care Type New Palliative care  ?Reason for referral Pain  ?Date of Admission 11/18/21  ?Date first seen by Palliative Care 11/21/21  ?# of days Palliative referral response time 1 Day(s)  ?# of days IP prior to Palliative referral 2  ?Clinical Assessment   ?Palliative Performance Scale Score 50%  ?Psychosocial & Spiritual Assessment   ?Palliative Care Outcomes   ?Patient/Family meeting held? No  ? ?  ? ? ?Patient Active Problem List  ? Diagnosis Date Noted  ? Vitamin D deficiency 11/21/2021  ? Protein-calorie malnutrition, severe 11/20/2021  ? Epigastric abdominal pain 11/19/2021  ? Abdominal pain 11/19/2021  ? Liver lesion 10/25/2021  ? Ascites 10/25/2021  ? Lymph node enlargement 10/25/2021  ? Cancer related pain 10/24/2021  ? Intractable abdominal pain 10/23/2021  ? Hypokalemia 10/23/2021  ? Moderate protein malnutrition ([place]) 10/23/2021  ? Normocytic anemia 10/23/2021  ? Genetic testing 01/14/2021  ? Family history of gastric cancer 12/23/2020  ? Family history of lung cancer 12/23/2020  ? Pancreatic adenocarcinoma ([place]) 12/10/2020  ? Obstructive jaundice 11/30/2020  ? Pancreatic mass 11/30/2020  ? History of pulmonary embolism 11/30/2020  ? Transaminitis 11/30/2020  ? Abnormal MRI of the abdomen   ? Biliary obstruction   ? Acute pancreatitis 11/26/2020  ? Choledocholithiasis 11/25/2020  ?  Cholelithiasis 11/25/2020  ? Pancreatitis 11/25/2020  ? Diarrhea 11/25/2020  ? First trimester pregnancy   ? Status post repeat low transverse cesarean section 08/03/2018  ? Pregnancy 07/18/2018  ? Sinus bradycardia 05/26/2015  ? PE (pulmonary embolism) 05/25/2015  ? Cigarette smoker 05/25/2015  ? Pulmonary emboli ([place])   ? H/O cesarean section 02/27/2015  ? ? ?Palliative Care Assessment & Plan  ? ?Patient Profile: ?41 y.o. female  with past medical history of pancreatic adenocarcinoma currently on chemotherapy complicated by biliary obstruction requiring stents, PE on Lovenox, pancreatitis who had stent exchange/ERCP at [facility] on 3/7 that revealed clogged stent with sludge and pus admitted on 11/18/2021 with worsening abdominal pain.  Palliative consulted for pain management ? ?Recommendations/Plan: ?Pain: Cancer-related.  Now on fentanyl patch. 100 mcg per hour.  Continue fentanyl IV 75 mcg every 2 hours as needed for breakthrough pain.  ?Complex multi dimensional aspects of pain.  ?Will touch base with Dr. Ronnald Ramp who is her palliative provider at [facility] tomorrow on 11-26-21.   ? ?Goals of Care and Additional Recommendations: ?Limitations on Scope of Treatment: Full Scope Treatment ? ?Code Status: ? ?  ?Code Status Orders  ?(From admission, onward)  ?  ? ? ?  ? ?  Start     Ordered  ? 11/18/21 2300  Full code  Continuous       ? 11/18/21 2302  ? ?  ?  ? ?  ? ?Code Status History   ? ? Date Active Date Inactive Code Status Order ID Comments User Context  ? 10/23/2021 0826 10/26/2021 2004 Full Code [id number]  Reubin Milan, MD ED  ? 11/30/2020 1224 12/05/2020 2147 Full Code [id number]  Norval Morton, MD ED  ? 11/25/2020 1141 11/28/2020 2040 Full Code [id number]  Harold Hedge, MD ED  ? 08/03/2018 0812 08/05/2018 1513 Full Code [id number]  Janyth Contes, MD Inpatient  ? 05/25/2015 0355 05/28/2015 1559 Full Code [id number]  Ivor Costa, MD ED  ? 02/27/2015 0603 03/01/2015 1509 Full Code [id number]  Newton Pigg, MD  Inpatient  ? ?  ? ? ?Prognosis: ? Unable to determine ? ?Discharge Planning: ?Home with Home Health ? ?Care plan was discussed with patient, RN, and Dr. Tyrell Antonio ? ?Thank you for allowing the Palliative Medicine Team to assist in the care of this patient. ? ?Loistine Chance, MD ? ?Please contact Palliative Medicine Team phone at [phone] for questions and concerns.  ? ?

## 2021-11-26 DIAGNOSIS — R1013 Epigastric pain: Secondary | ICD-10-CM | POA: Diagnosis not present

## 2021-11-26 LAB — BASIC METABOLIC PANEL
Anion gap: 7 (ref 5–15)
BUN: 6 mg/dL (ref 6–20)
CO2: 24 mmol/L (ref 22–32)
Calcium: 8.6 mg/dL — ABNORMAL LOW (ref 8.9–10.3)
Chloride: 102 mmol/L (ref 98–111)
Creatinine, Ser: 0.39 mg/dL — ABNORMAL LOW (ref 0.44–1.00)
GFR, Estimated: >60 mL/min (ref >60)
Glucose, Bld: 95 mg/dL (ref 70–99)
Potassium: 4.1 mmol/L (ref 3.5–5.1)
Sodium: 133 mmol/L — ABNORMAL LOW (ref 135–145)

## 2021-11-26 LAB — MAGNESIUM: Magnesium: 1.8 mg/dL (ref 1.7–2.4)

## 2021-11-26 LAB — VITAMIN K1, SERUM: VITAMIN K1: 0.14 ng/mL (ref 0.10–2.20)

## 2021-11-26 MED ORDER — VITAMIN D (ERGOCALCIFEROL) 1.25 MG (50000 UNIT) PO CAPS
50000.0000 [IU] | ORAL_CAPSULE | ORAL | 0 refills | Status: DC
Start: 2021-11-28 — End: 2022-02-21

## 2021-11-26 MED ORDER — SENNOSIDES-DOCUSATE SODIUM 8.6-50 MG PO TABS: 1.0000 | ORAL_TABLET | Freq: Two times a day (BID) | ORAL | Status: DC

## 2021-11-26 MED ORDER — ENSURE ENLIVE PO LIQD
237.0000 mL | Freq: Two times a day (BID) | ORAL | 12 refills | Status: DC
Start: 2021-11-26 — End: 2022-02-21

## 2021-11-26 MED ORDER — NALOXONE HCL 4 MG/0.1ML NA LIQD: 1.0000 | Freq: Once | NASAL | 0 refills | Status: AC

## 2021-11-26 MED ORDER — PANCRELIPASE (LIP-PROT-AMYL) 12000-38000 UNITS PO CPEP: 12000.0000 [IU] | ORAL_CAPSULE | Freq: Three times a day (TID) | ORAL | 0 refills | Status: DC

## 2021-11-26 MED ORDER — POLYETHYLENE GLYCOL 3350 17 G PO PACK: 17.0000 g | PACK | Freq: Two times a day (BID) | ORAL | 0 refills | Status: DC

## 2021-11-26 MED ORDER — HEPARIN SOD (PORK) LOCK FLUSH 100 UNIT/ML IV SOLN
500.0000 [IU] | INTRAVENOUS | Status: AC | PRN
  Administered 2021-11-26: 500 [IU]
  Filled 2021-11-26: qty 5

## 2021-11-26 MED ORDER — ADULT MULTIVITAMIN W/MINERALS CH
1.0000 | ORAL_TABLET | Freq: Every day | ORAL | Status: DC
  Administered 2021-11-26: 1 via ORAL
  Filled 2021-11-26: qty 1

## 2021-11-26 MED ORDER — DIAZEPAM 5 MG PO TABS: 5.0000 mg | ORAL_TABLET | Freq: Three times a day (TID) | ORAL | 0 refills | Status: DC | PRN

## 2021-11-26 MED ORDER — PANTOPRAZOLE SODIUM 40 MG PO TBEC: 40.0000 mg | DELAYED_RELEASE_TABLET | Freq: Two times a day (BID) | ORAL | 0 refills | Status: DC

## 2021-11-26 MED ORDER — DICYCLOMINE HCL 20 MG PO TABS: 20.0000 mg | ORAL_TABLET | Freq: Two times a day (BID) | ORAL | 0 refills | Status: DC | PRN

## 2021-11-26 MED ORDER — PANCRELIPASE (LIP-PROT-AMYL) 12000-38000 UNITS PO CPEP
12000.0000 [IU] | ORAL_CAPSULE | Freq: Three times a day (TID) | ORAL | Status: DC
  Administered 2021-11-26: 12000 [IU] via ORAL
  Filled 2021-11-26: qty 1

## 2021-11-26 MED ORDER — FENTANYL 100 MCG/HR TD PT72
1.0000 | MEDICATED_PATCH | TRANSDERMAL | 0 refills | Status: DC
Start: 2021-11-27 — End: 2022-02-21

## 2021-11-26 MED ORDER — HYDROMORPHONE HCL 4 MG PO TABS: 2.0000 mg | ORAL_TABLET | ORAL | 0 refills | Status: AC | PRN

## 2021-11-26 NOTE — Discharge Summary (Signed)
?Discharge Summary ? ?Cynthia [name] WGN:[id number] DOB: 11/29/1980 ? ?PCP: [facility] ? ?Admit date: 11/18/2021 ?Discharge date: 11/26/2021 ? ?Time spent: 5mns, more than 50% time spent on coordination of care ?Case discussed with palliative care prior to discharge ? ?Recommendations for Outpatient Follow-up:  ?F/u with [facility] oncology and palliative care on 3/16   for hospital discharge follow up ? ? ?Unresulted Labs (From admission, onward)  ? ?  Start     Ordered  ? 11/21/21 0500  Vitamin K1, Serum  Tomorrow morning,   R       ?Question:  Specimen collection method  Answer:  Lab=Lab collect  ? 11/20/21 1146  ? ?  ?  ? ?  ?  ? ?Discharge Diagnoses:  ?Active Hospital Problems  ? Diagnosis Date Noted  ? Epigastric abdominal pain 11/19/2021  ?  Priority: High  ? Hypokalemia 10/23/2021  ?  Priority: Medium   ? Pancreatic adenocarcinoma ([place] 12/10/2020  ?  Priority: Low  ? History of pulmonary embolism 11/30/2020  ?  Priority: Low  ? Vitamin D deficiency 11/21/2021  ? Protein-calorie malnutrition, severe 11/20/2021  ? Abdominal pain 11/19/2021  ?  ?Resolved Hospital Problems  ?No resolved problems to display.  ? ? ?Discharge Condition: stable ? ?Diet recommendation: Regular diet ? ?Filed Weights  ? 11/18/21 1421 11/21/21 1011 11/26/21 0525  ?Weight: 52.6 kg 52.6 kg 44.5 kg  ? ? ?History of present illness: (Per admitting MD Dr. RMarlowe Sax ?Patient coming from: Home ?  ?Chief Complaint: Abdominal pain ?  ?HPI: JKAYELYN LEMONis a 41y.o. female with medical history significant of pancreatic adenocarcinoma currently on chemotherapy complicated by biliary obstruction requiring stent, PE on Lovenox, pancreatitis.  Recently admitted on 10/23/2021 for abdominal pain and treated for acute pancreatitis.  [facility] of [facility]  [facility]  [facility]ent which was removed.  There was severe biliary stricture in the lower third of the main bile duct which was malignant appearing and treated with placement of a new stent.  Ascending cholangitis was present and the biliary tree was swept with removal of pus and sludge.  Patient was discharged with a 5-day course of ciprofloxacin.  [facility] complaining of worsening abdominal pain. ?  ?History provided by patient limited.  Reports severe epigastric abdominal pain radiating to her back for the past 1 day.  Pain worse after ERCP today.  Denies fevers or chills.  Denies nausea or vomiting.  Last chemo was a month ago.  No other complaints.  Denies cough, shortness of breath, or chest pain. ?  ?ED Course: No fever, tachycardia, or hypotension.  WBC 11.2.  Hemoglobin 10.2, stable.  Sodium 131.  Potassium 3.4.  AST 104, ALT 89, alk phos 980, T. bili 3.9.  Lipase normal.  Blood cultures pending.   ?  ?CT showing common bile duct stent in place.  Small amount of pneumobilia.  New mild periportal edema of uncertain etiology.  Questionable mild gallbladder wall edema.  Hypodense hepatic lesion has increased in size and single new small hepatic lesion, findings concerning for worsening metastatic disease.  Mild wall thickening of the proximal colon worrisome for nonspecific colitis.  Small amount of ascites has increased. ?  ?Patient was given Dilaudid, ketamine, Zofran, 2 L LR boluses, and Zosyn. ?  ?  ED physician discussed the case with Dr. Rush Landmark who recommended HIDA scan and antibiotic coverage with Zosyn.  GI will consult in a.m. ?  ? ?Hospital Course:  ?Principal Problem: ?  Epigastric abdominal pain ?Active Problems: ?  Hypokalemia ?  History of pulmonary embolism ?  Pancreatic adenocarcinoma ([place]) ?  Abdominal pain ?  Protein-calorie malnutrition, severe ?  Vitamin D deficiency ? ? ?Assessment and Plan: ? ?* Epigastric abdominal pain ?-Patient with history of pancreatic  adenocarcinoma complicated by biliary obstruction requiring stent. [facility]CP done at [facility] on 11/18/2021 revealed totally occluded CBD stent which was removed.  There was severe biliary stricture in the lower third of the main bile duct which was malignant appearing and treated with placement of a new stent. Ascending cholangitis was present and the biliary tree was swept with removal of pus and sludge.   ?-CT done on 3/7 on admission to [place] showing common bile duct stent in place. Questionable mild gallbladder wall edema.  Hypodense hepatic lesion has increased in size and single new small hepatic lesion, findings concerning for worsening metastatic disease.  ?-GI consulted and recommended HIDA scan initially. Patient refuse HIDA scan does not want to be off pain meds. ?-Surgery consulted, recommended continue with IV antibiotics. If no improvement might need cholecystostomy tube.  ?-Dilaudid as needed for pain. Resume home MS contin.  ?-- Treated initially with IV zosyn then Augmentin , Last dose on 3/15 ?-Repeated US: Layering sludge in the gallbladder with gallbladder wall thickening and positive sonographic Murphy sign. Biliary duct stent in place. Gallbladder sludge and wall thickening could be secondary to biliary stasis/reactive. ?-Underwent to ERCP 3/10 Dr Ardis Hughs couldn't advance  duodenoscope past distal duodenal bulb.- ?-Blood culture no growth to date.  ?-lipase unremarkable ?-LFT trending down ?-Dr Tyrell Antonio  discussed case  with patient oncologist from [place] who think patient pain is related to cancer, not related to stent malfunction  or gallbladder diseases.  ?  ?-Palliative consulted to assist with pain management. MS contin was change to Fentanyl Patch.  Palliative care discussed case with [facility] care who recommend continue fentanyl patch , prn dilaudid and follow-up with [facility] closely ? ?Patient desired to be discharged today, so she can [facility]  tomorrow ? ?Patient requested Dilaudid refill at discharge, ?Patient also requested Valium refill at discharge ?She understand [facility] with [facility] as well ? ?She is also discharged on ppi and pancreatic enzyme to hopefully help with ab pain ? ? ? ? ? ? ?Pancreatic adenocarcinoma ([place]) ?Follows at [facility] and currently on chemotherapy.  CT showing findings concerning for worsening metastatic disease. ?-Outpatient oncology follow-up ?She has appointment on 3/16 with her oncology.  ? ?History of pulmonary embolism ?-Contiue with Lovenox ? ?Hypokalemia/Hypomagnesemia;  ?Replaced and improved  ?  ?Vitamin D deficiency ?Started supplement.  ? ?Protein-calorie malnutrition, severe ?Tolerating diet.  ?Seen by dietitian, serum vitamins A, D, and K, and zinc d/t cancer dx. ordered by dietitian ? ?Discharge Exam: ?BP 113/73 (BP Location: Left Arm)   Pulse 62   Temp 98.8 ?F (37.1 ?C) (Oral)   Resp 18   Ht _0  (1.651 m)   Wt 44.5 kg   SpO2 97%   BMI 16.33 kg/m?  ? ?General: frail, aaox3 ?Cardiovascular: RRR ?Respiratory: Normal respiratory effort ? ? ? ?Discharge Instructions   ? ? Diet general   Complete by: As directed ?  ? Increase activity slowly   Complete  by: As directed ?  ? ?  ? ?Allergies as of 11/26/2021   ?No Known Allergies ?  ? ?  ?Medication List  ?  ? ?STOP taking these medications   ? ?docusate sodium 100 MG capsule ?Commonly known as: COLACE ?  ?ibuprofen 200 MG tablet ?Commonly known as: ADVIL ?  ?morphine 30 MG 12 hr tablet ?Commonly known as: MS CONTIN ?  ? ?  ? ?TAKE these medications   ? ?diazepam 5 MG tablet ?Commonly known as: Valium ?Take 1 tablet (5 mg total) by mouth every 8 (eight) hours as needed for muscle spasms. ?  ?dicyclomine 20 MG tablet ?Commonly known as: BENTYL ?Take 1 tablet (20 mg total) by mouth 2 (two) times daily as needed for up to 7 days for spasms. ?  ?enoxaparin 80 MG/0.8ML injection ?Commonly known as: LOVENOX ?Inject 80 mg into the skin daily. ?  ?feeding  supplement Liqd ?Take 237 mLs by mouth 2 (two) times daily between meals. ?  ?fentaNYL 100 MCG/HR ?Commonly known as: C-Road ?Place 1 patch onto the skin every 3 (three) days. ?Start taking on: November 27, 2021 ?  ?HYD

## 2021-11-26 NOTE — Plan of Care (Signed)
  Problem: Education: Goal: Knowledge of General Education information will improve Description: Including pain rating scale, medication(s)/side effects and non-pharmacologic comfort measures Outcome: Progressing   Problem: Coping: Goal: Level of anxiety will decrease Outcome: Progressing   Problem: Pain Managment: Goal: General experience of comfort will improve Outcome: Progressing   

## 2021-11-26 NOTE — Progress Notes (Signed)
Nutrition Follow-up ? ?DOCUMENTATION CODES:  ? ?Severe malnutrition in context of chronic illness, Underweight ? ?INTERVENTION:  ?- will communicate with PCP about serum vitamin A result of 8 (reference range: 18.9-57.3) ?- will monitor for vitamin K result after d/c and communicate with PCP if warranted about this result.  ?- continue Ensure Plus High Protein BID, each supplement provides 350 kcal and 20 grams of protein. ?- entered [facility] Stay smart phrase in AVS.  ? ? ?NUTRITION DIAGNOSIS:  ? ?Severe Malnutrition related to chronic illness, cancer and cancer related treatments as evidenced by severe fat depletion, severe muscle depletion, percent weight loss. -ongoing ? ?GOAL:  ? ?Patient will meet greater than or equal to 90% of their needs -unmet ? ?MONITOR:  ? ?PO intake, Supplement acceptance, Labs, Weight trends ? ?ASSESSMENT:  ? ?41 year old past medical history significant for pancreatic adenocarcinoma currently on chemotherapy complicated by biliary obstruction requiring stents, PE on Lovenox, pancreatitis, tobacco abuse, and CVA. She was admitted on 2/9 due to abdominal pain and treatment for acute pancreatitis. She had ERCP and stent exchange at [facility] two days PTA. At [facility] she was noted to have totally occluded CBD stent which required removal and replacement. 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 She presented to the ED due to worsening abdominal pain (admitted for the same). She was noted to have hepatic lesion which had increased in size and is concerning for metastatic disease and mild wall thickening for colon concerning for colitis. ? ?Patient noted to be sleeping with no visitors present. Able to talk with RN who confirms patient to d/c home today. She shares that patient drank some water and had an Ensure at bedside for when she feels ready for it but did not want any breakfast this AM. ? ?Limited meal intakes documented over the past week: 80% of  breakfast and 75% of dinner on 3/12 (total of 1830 kcal and 57 grams protein). Unsure if patient consumed all of this or if a visitor shared the meals with her. ? ?Review of order indicates that she has accepted Ensure 75% of the time offered.  ? ?RN shares that patient is scheduled for an appointment at [facility] tomorrow; where she receives her Oncology-related care.  ? ?RN also shared that creon was added to orders today; uncertain if plan is for d/c home with prescription or for this to be re-addressed by [facility] providers on 3/16. ? ?Weight today is -18 lb compared to admission weight. No information documented in the edema section of flow sheet this hospitalization.  ? ? ?Labs reviewed; Na: 133 mmol/l, creatinine: 0.39 mg/dl, Ca: 8.6 mg/dl, alk phos elevated but trending down daily.  ? ?Medications reviewed; 12000 units creon TID, 2 g IV Mg sulfate x1 run 3/14, 40 mg oral protonix BID, 17 g miralax BID, 1 tablet senokot BID, 33825 units drisdol every 7 days starting 3/10. ? ?IVF; LR @ 75 ml/hr. ? ? ?Diet Order:   ?Diet Order   ? ?       ?  Diet general       ?  ?  Diet regular Room service appropriate? Yes; Fluid consistency: Thin  Diet effective now       ?  ? ?  ?  ? ?  ? ? ?EDUCATION NEEDS:  ? ?Not appropriate for education at this time ? ?Skin:  Skin Assessment: Reviewed RN Assessment ? ?Last BM:  PTA/unknown ? ?Height:  ? ?Ht Readings from  Last 1 Encounters:  ?11/21/21 5' 5" (1.651 m)  ? ? ?Weight:  ? ?Wt Readings from Last 1 Encounters:  ?11/26/21 44.5 kg  ? ? ? ?BMI:  Body mass index is 16.33 kg/m?. ? ?Estimated Nutritional Needs:  ?Kcal:  2100-2300 kcal ?Protein:  105-120 grams ?Fluid:  >/= 2.3 L/day ? ? ? ? ?Jarome Matin, MS, RD, LDN ?Inpatient Clinical Dietitian ?RD pager # available in [place]  ?After hours/weekend pager # available in [place] ? ?

## 2021-11-26 NOTE — Discharge Instructions (Signed)
[facility] Stay ?Proper nutrition can help your body recover from illness and injury.   ?Foods and beverages high in protein, vitamins, and minerals help rebuild muscle loss, promote healing, & reduce fall risk.  ? ?In addition to eating healthy foods, a nutrition shake is an easy, delicious way to get the nutrition you need during and after your hospital stay ? ?It is recommended that you continue to drink 2 bottles per day of:       Ensure Plus High Protein (or equivalent) for at least 1 month (30 days) after your hospital stay  ? ?Tips for adding a nutrition shake into your routine: ?As allowed, drink one with vitamins or medications instead of water or juice ?Enjoy one as a tasty mid-morning or afternoon snack ?Drink cold or make a milkshake out of it ?Drink one instead of milk with cereal or snacks ?Use as a coffee creamer ?  ?Available at the following grocery stores and pharmacies:           ?* [place]  ?* [facility]          * [place]  ?* [facility]      * [facility]  * [employer]   ?* [facility]  * [employer]   ?[facility] [phone]  ?          ?For COUPONS visit: www.[email or web address]/join or [email or web address]  ? ?Suggested Substitutions ?Ensure Plus = Boost Plus = Carnation Breakfast Essentials = Boost Compact ?Ensure Active Clear = Boost Breeze ?Glucerna Shake = Boost Glucose Control = Carnation Breakfast Essentials SUGAR FREE ? ?  ? ?

## 2021-11-27 ENCOUNTER — Emergency Department (HOSPITAL_COMMUNITY)
Admission: EM | Admit: 2021-11-27 | Discharge: 2021-11-27 | Disposition: A | Discharge status: Home / Self Care | Payer: Medicaid Other | Attending: Emergency Medicine | Admitting: Emergency Medicine

## 2021-11-27 DIAGNOSIS — Z8507 Personal history of malignant neoplasm of pancreas: Secondary | ICD-10-CM | POA: Diagnosis not present

## 2021-11-27 DIAGNOSIS — Z452 Encounter for adjustment and management of vascular access device: Secondary | ICD-10-CM | POA: Diagnosis present

## 2021-11-27 MED ORDER — HEPARIN SOD (PORK) LOCK FLUSH 100 UNIT/ML IV SOLN
500.0000 [IU] | Freq: Once | INTRAVENOUS | Status: AC
  Administered 2021-11-27: 500 [IU]
  Filled 2021-11-27: qty 5

## 2021-11-27 NOTE — ED Notes (Signed)
An After Visit Summary was printed and given to the patient. Discharge instructions given and no further questions at this time.  

## 2021-11-27 NOTE — Discharge Instructions (Signed)
Follow up with your [facility] for further care.  ?

## 2021-11-27 NOTE — ED Provider Notes (Signed)
?[facility] DEPT ?Provider Note ? ? ?CSN: [id number] ?Arrival date & time: 11/27/21  1556 ? ?  ? ?History ? ?No chief complaint on file. ? ? ?Cynthia [name] is a 41 y.o. female. ? ?The history is provided by the patient and medical records. No language interpreter was used.  ? ?41 year old female with history of pancreatic cancer who has a Port-A-Cath presenting today requesting to have her Port-A-Cath deaccessed.  Patient states she went to do for her cancer treatment today.  She had her port accessed but then soon after was told that she does not need treatment today.  She mention the staff forgot to deaccessed the port and when she returned home, she realized that and does not want to take a long trip back to the [facility] in [place].  She is here requesting to have the Port-A-Cath deaccessed.  She has no other complaint. ? ?Home Medications ?Prior to Admission medications   ?Medication Sig Start Date End Date Taking? Authorizing Provider  ?diazepam (VALIUM) 5 MG tablet Take 1 tablet (5 mg total) by mouth every 8 (eight) hours as needed for muscle spasms. 11/26/21 11/26/22  Florencia Reasons, MD  ?dicyclomine (BENTYL) 20 MG tablet Take 1 tablet (20 mg total) by mouth 2 (two) times daily as needed for up to 7 days for spasms. 11/26/21 12/03/21  Florencia Reasons, MD  ?enoxaparin (LOVENOX) 80 MG/0.8ML injection Inject 80 mg into the skin daily. 05/28/21   [provider]  ?feeding supplement (ENSURE ENLIVE / ENSURE PLUS) LIQD Take 237 mLs by mouth 2 (two) times daily between meals. 11/26/21   Florencia Reasons, MD  ?fentaNYL (DURAGESIC) 100 MCG/HR Place 1 patch onto the skin every 3 (three) days. 11/27/21   Florencia Reasons, MD  ?HYDROmorphone (DILAUDID) 4 MG tablet Take 0.5-1 tablets (2-4 mg total) by mouth every 4 (four) hours as needed for up to 7 days for moderate pain or severe pain. 11/26/21 12/03/21  Florencia Reasons, MD  ?lidocaine-prilocaine (EMLA) cream Apply 1 application topically as needed (for port access).  05/23/21   [provider]  ?lipase/protease/amylase (CREON) 12000-38000 units CPEP capsule Take 1 capsule (12,000 Units total) by mouth 3 (three) times daily with meals. 11/26/21   Florencia Reasons, MD  ?ondansetron (ZOFRAN-ODT) 8 MG disintegrating tablet Take 8 mg by mouth every 8 (eight) hours as needed for nausea. 07/16/21   [provider]  ?pantoprazole (PROTONIX) 40 MG tablet Take 1 tablet (40 mg total) by mouth 2 (two) times daily. 11/26/21   Florencia Reasons, MD  ?polyethylene glycol (MIRALAX / GLYCOLAX) 17 g packet Take 17 g by mouth 2 (two) times daily. 11/26/21   Florencia Reasons, MD  ?prochlorperazine (COMPAZINE) 10 MG tablet Take 10 mg by mouth every 6 (six) hours as needed for nausea. 07/17/21   [provider]  ?senna-docusate (SENOKOT-S) 8.6-50 MG tablet Take 1 tablet by mouth 2 (two) times daily. 11/26/21   Florencia Reasons, MD  ?Vitamin D, Ergocalciferol, (DRISDOL) 1.25 MG (50000 UNIT) CAPS capsule Take 1 capsule (50,000 Units total) by mouth every 7 (seven) days. 11/28/21   Florencia Reasons, MD  ?   ? ?Allergies    ?Patient has no known allergies.   ? ?Review of Systems   ?Review of Systems  ?All other systems reviewed and are negative. ? ?Physical Exam ?Updated Vital Signs ?BP 109/76 (BP Location: Right Arm)   Pulse 95   Temp 97.9 ?F (36.6 ?C) (Oral)   Resp 18   SpO2  100%  ?Physical Exam ?Vitals and nursing note reviewed.  ?Constitutional:   ?   General: She is not in acute distress. ?   Appearance: She is well-developed.  ?HENT:  ?   Head: Atraumatic.  ?Eyes:  ?   Conjunctiva/sclera: Conjunctivae normal.  ?Cardiovascular:  ?   Comments: Access port noted to right Port-A-Cath no signs of infection ?Pulmonary:  ?   Effort: Pulmonary effort is normal.  ?Musculoskeletal:  ?   Cervical back: Neck supple.  ?Skin: ?   Findings: No rash.  ?Neurological:  ?   Mental Status: She is alert.  ?Psychiatric:     ?   Mood and Affect: Mood normal.  ? ? ?ED Results / Procedures / Treatments   ?Labs ?(all labs ordered are listed,  but only abnormal results are displayed) ?Labs Reviewed - No data to display ? ?EKG ?None ? ?Radiology ?No results found. ? ?Procedures ?Procedures  ? ? ?Medications Ordered in ED ?Medications  ?heparin lock flush 100 unit/mL (has no administration in time range)  ? ? ?ED Course/ Medical Decision Making/ A&P ?  ?                        ?Medical Decision Making ? ?BP 109/76 (BP Location: Right Arm)   Pulse 95   Temp 97.9 ?F (36.6 ?C) (Oral)   Resp 18   SpO2 100%  ? ?4:51 PM ?Patient had her Port-A-Cath accessed earlier today at the [facility] for chemo treatment however she reports she did not not receive treatment and was sent home without having her port deaccessed.  She is requesting for her port to be deaccess.  No other complaint. Will have port de-access.  Pt to continue care through her cancer center.  ? ? ? ? ? ? ? ?Final Clinical Impression(s) / ED Diagnoses ?Final diagnoses:  ?Encounter for deaccessing implanted intravenous port  ? ? ?Rx / DC Orders ?ED Discharge Orders   ? ? None  ? ?  ? ? ?  ?Domenic Moras, PA-C ?11/27/21 1705 ? ?  ?Sherwood Gambler, MD ?11/27/21 2259 ? ?

## 2021-11-27 NOTE — ED Triage Notes (Signed)
Patient reports having her port accessed at [facility] today, left without getting it de-accessed, needs it de-accessed.  ?

## 2021-12-03 ENCOUNTER — Telehealth: Payer: Self-pay

## 2021-12-03 NOTE — Telephone Encounter (Signed)
(  10:52 am) SW attempted to contact patient to provide support to her. SW was unable to leave a message. SW sent her a text requesting a call back.  ?

## 2021-12-03 NOTE — Telephone Encounter (Signed)
Attempted to contact patient to schedule a Palliative Care consult appointment. No answer and unable to leave a message voicemail has not been setup.   ?

## 2021-12-04 ENCOUNTER — Telehealth: Payer: Self-pay

## 2021-12-04 DIAGNOSIS — Z515 Encounter for palliative care: Secondary | ICD-10-CM

## 2021-12-04 NOTE — Telephone Encounter (Signed)
Spoke with patient's mother Barnetta Chapel she will contact patient and give her the message to return call to schedule Palliative Care consult.  ?

## 2021-12-04 NOTE — Progress Notes (Signed)
[facility] [employer] mailed patient a packet of information on palliative care services and [facility]. Attempts continue to be made to contact patient by palliative team to begin services.  ?

## 2021-12-09 ENCOUNTER — Telehealth: Payer: Self-pay

## 2021-12-09 NOTE — Telephone Encounter (Signed)
Attempted to contact patient to schedule a Palliative Care consult appointment. No answer and unable to leave a message. Spoke with patient's mother Barnetta Chapel and she states she will have patient call back.  ? ?

## 2021-12-15 ENCOUNTER — Telehealth: Payer: Self-pay

## 2021-12-15 NOTE — Telephone Encounter (Signed)
Attempted to contact patient and patient's mother Cynthia [name] to schedule a Palliative Care consult appointment. No answer left a message for Cynthia [name] to return call. Patient did not have a voicemail setup. ? ?

## 2021-12-18 ENCOUNTER — Other Ambulatory Visit: Payer: Self-pay

## 2021-12-18 ENCOUNTER — Emergency Department (HOSPITAL_COMMUNITY): Payer: Medicaid Other

## 2021-12-18 ENCOUNTER — Encounter (HOSPITAL_COMMUNITY): Payer: Self-pay

## 2021-12-18 ENCOUNTER — Emergency Department (HOSPITAL_COMMUNITY)
Admission: EM | Admit: 2021-12-18 | Discharge: 2021-12-18 | Disposition: A | Discharge status: Home / Self Care | Payer: Medicaid Other | Attending: Emergency Medicine | Admitting: Emergency Medicine

## 2021-12-18 DIAGNOSIS — R450 Nervousness: Secondary | ICD-10-CM

## 2021-12-18 DIAGNOSIS — C259 Malignant neoplasm of pancreas, unspecified: Secondary | ICD-10-CM | POA: Diagnosis present

## 2021-12-18 LAB — CBC
HCT: 29.8 % — ABNORMAL LOW (ref 36.0–46.0)
Hemoglobin: 9.9 g/dL — ABNORMAL LOW (ref 12.0–15.0)
MCH: 28.6 pg (ref 26.0–34.0)
MCHC: 33.2 g/dL (ref 30.0–36.0)
MCV: 86.1 fL (ref 80.0–100.0)
Platelets: 202 10*3/uL (ref 150–400)
RBC: 3.46 MIL/uL — ABNORMAL LOW (ref 3.87–5.11)
RDW: 15.1 % (ref 11.5–15.5)
WBC: 7.4 10*3/uL (ref 4.0–10.5)
nRBC: 0 % (ref 0.0–0.2)

## 2021-12-18 LAB — COMPREHENSIVE METABOLIC PANEL
ALT: 60 U/L — ABNORMAL HIGH (ref 0–44)
AST: 26 U/L (ref 15–41)
Albumin: 3.1 g/dL — ABNORMAL LOW (ref 3.5–5.0)
Alkaline Phosphatase: 246 U/L — ABNORMAL HIGH (ref 38–126)
Anion gap: 7 (ref 5–15)
BUN: 7 mg/dL (ref 6–20)
CO2: 26 mmol/L (ref 22–32)
Calcium: 8.4 mg/dL — ABNORMAL LOW (ref 8.9–10.3)
Chloride: 103 mmol/L (ref 98–111)
Creatinine, Ser: 0.42 mg/dL — ABNORMAL LOW (ref 0.44–1.00)
GFR, Estimated: >60 mL/min (ref >60)
Glucose, Bld: 93 mg/dL (ref 70–99)
Potassium: 3.9 mmol/L (ref 3.5–5.1)
Sodium: 136 mmol/L (ref 135–145)
Total Bilirubin: 0.3 mg/dL (ref 0.3–1.2)
Total Protein: 5.6 g/dL — ABNORMAL LOW (ref 6.5–8.1)

## 2021-12-18 MED ORDER — HEPARIN SOD (PORK) LOCK FLUSH 100 UNIT/ML IV SOLN
500.0000 [IU] | Freq: Once | INTRAVENOUS | Status: AC
  Administered 2021-12-18: 500 [IU]
  Filled 2021-12-18: qty 5

## 2021-12-18 NOTE — ED Triage Notes (Signed)
Patient said she is very nauseous and jittery after taking 1 '30mg'$  morphine and 1 45 mg morphine, 3x today '8mg'$  dilaudid and 1 dicyclomine '20mg'$ . Supposed to be taking it for her pancreatic cancer. She just started taking celecoxib '100mg'$  this morning at 5am. This was the new medication she started and thinks it is what made her feel weird.  ?

## 2021-12-18 NOTE — ED Notes (Signed)
Patient's port flushed with NS 10 mL and Heparin 5 mL prior to de-access  ?

## 2021-12-18 NOTE — Discharge Instructions (Signed)
Your labs appear normal for you although you are somewhat anemic this has been stable. ?Your white blood cell count is normal.  There is no evidence of infection.  You can continue to take your medications as prescribed. ?Please contact your oncologist if you are continue to have any symptoms. ?

## 2021-12-18 NOTE — ED Provider Notes (Signed)
?[facility] DEPT ?Provider Note ? ? ?CSN: [id number] ?Arrival date & time: 12/18/21  2029 ? ?  ? ?History ? ?Chief Complaint  ?Patient presents with  ? Medication Reaction  ? ? ?Cynthia [name] is a 41 y.o. female. ? ?HPI ?41 yo female with pancreatic cancer increased morphine from 30 to 45 mg today and increased dilaudid from 6 mg tid to 8 mg, took advil then celecoxib.  Feels like she is jittery, cold hot, chills and drowsy.  No cough, headache, chest pain, nausea vomiting. No fever. Last chemo last Tuesday. ?No rashes. ?No uti symptoms ? ? ? ?  ? ?Home Medications ?Prior to Admission medications   ?Medication Sig Start Date End Date Taking? Authorizing Provider  ?diazepam (VALIUM) 5 MG tablet Take 1 tablet (5 mg total) by mouth every 8 (eight) hours as needed for muscle spasms. 11/26/21 11/26/22  Florencia Reasons, MD  ?dicyclomine (BENTYL) 20 MG tablet Take 1 tablet (20 mg total) by mouth 2 (two) times daily as needed for up to 7 days for spasms. 11/26/21 12/03/21  Florencia Reasons, MD  ?enoxaparin (LOVENOX) 80 MG/0.8ML injection Inject 80 mg into the skin daily. 05/28/21   [provider]  ?feeding supplement (ENSURE ENLIVE / ENSURE PLUS) LIQD Take 237 mLs by mouth 2 (two) times daily between meals. 11/26/21   Florencia Reasons, MD  ?fentaNYL (DURAGESIC) 100 MCG/HR Place 1 patch onto the skin every 3 (three) days. 11/27/21   Florencia Reasons, MD  ?lidocaine-prilocaine (EMLA) cream Apply 1 application topically as needed (for port access). 05/23/21   [provider]  ?lipase/protease/amylase (CREON) 12000-38000 units CPEP capsule Take 1 capsule (12,000 Units total) by mouth 3 (three) times daily with meals. 11/26/21   Florencia Reasons, MD  ?ondansetron (ZOFRAN-ODT) 8 MG disintegrating tablet Take 8 mg by mouth every 8 (eight) hours as needed for nausea. 07/16/21   [provider]  ?pantoprazole (PROTONIX) 40 MG tablet Take 1 tablet (40 mg total) by mouth 2 (two) times daily. 11/26/21   Florencia Reasons, MD   ?polyethylene glycol (MIRALAX / GLYCOLAX) 17 g packet Take 17 g by mouth 2 (two) times daily. 11/26/21   Florencia Reasons, MD  ?prochlorperazine (COMPAZINE) 10 MG tablet Take 10 mg by mouth every 6 (six) hours as needed for nausea. 07/17/21   [provider]  ?senna-docusate (SENOKOT-S) 8.6-50 MG tablet Take 1 tablet by mouth 2 (two) times daily. 11/26/21   Florencia Reasons, MD  ?Vitamin D, Ergocalciferol, (DRISDOL) 1.25 MG (50000 UNIT) CAPS capsule Take 1 capsule (50,000 Units total) by mouth every 7 (seven) days. 11/28/21   Florencia Reasons, MD  ?   ? ?Allergies    ?Patient has no known allergies.   ? ?Review of Systems   ?Review of Systems ? ?Physical Exam ?Updated Vital Signs ?BP 123/85 (BP Location: Right Arm)   Pulse 69   Temp (!) 97.4 ?F (36.3 ?C) (Oral)   Resp 18   Ht 1.651 m ('5\' 5"' )   Wt 45.8 kg   SpO2 100%   BMI 16.81 kg/m?  ?Physical Exam ? ?ED Results / Procedures / Treatments   ?Labs ?(all labs ordered are listed, but only abnormal results are displayed) ?Labs Reviewed  ?CBC - Abnormal; Notable for the following components:  ?    Result Value  ? RBC 3.46 (*)   ? Hemoglobin 9.9 (*)   ? HCT 29.8 (*)   ? All other components within normal limits  ?COMPREHENSIVE METABOLIC PANEL -  Abnormal; Notable for the following components:  ? Creatinine, Ser 0.42 (*)   ? Calcium 8.4 (*)   ? Total Protein 5.6 (*)   ? Albumin 3.1 (*)   ? ALT 60 (*)   ? Alkaline Phosphatase 246 (*)   ? All other components within normal limits  ?URINALYSIS, ROUTINE W REFLEX MICROSCOPIC  ? ? ?EKG ?None ? ?Radiology ?DG Chest 2 View ? ?Result Date: 12/18/2021 ?CLINICAL DATA:  Pancreatic cancer, chemotherapy, chills EXAM: CHEST - 2 VIEW COMPARISON:  09/21/2020 FINDINGS: Lungs are clear. No pneumothorax or pleural effusion. Right internal jugular chest port tip seen within the superior vena cava. Cardiac size within normal limits. No acute bone abnormality. IMPRESSION: No active cardiopulmonary disease. Electronically Signed   By: Fidela Salisbury M.D.   On:  12/18/2021 21:22   ? ?Procedures ?Procedures  ? ? ?Medications Ordered in ED ?Medications - No data to display ? ?ED Course/ Medical Decision Making/ A&P ?Clinical Course as of 12/18/21 2231  ?Thu Dec 18, 2021  ?2228 CBC reviewed and interpreted with normal white blood cell count and anemia with normal platelets hemoglobin is 9.9 first prior is 9 and I suspect she has anemia of chronic disease with her pancreatic cancer.  She does not appear symptomatic from this. [DR]  ?2229 Comprehensive metabolic panel(!) ?C-Met reviewed and interpreted and significant for creatinine being low at 1.42, calcium low, protein albumin low consistent with poor nutrition with her pancreatitis and elevated liver enzymes consistent with her cancer. [DR]  ?2229 Chest x-Normand Damron reviewed interpreted and no acute abnormalities [DR]  ?  ?Clinical Course User Index ?[DR] Pattricia Boss, MD  ? ?                        ?Medical Decision Making ?This is a 41 year old female with pancreatic cancer came today because she felt jittery and cold and hot after taking her medications.  She initially identified this is likely due to her increase in medications.  However, on history it was noted that she has recently had chemo.  Due to this, labs were obtained.  She is not neutropenic and her other labs appear stable.  Blood pressure, heart rate are normal.  Suspect that she did have some jitteriness secondary to the increase in her medications.  However, they are controlling her symptoms.  She is advised regarding following up with her oncologist managing her symptoms. ?She appears stable for discharge ? ?Amount and/or Complexity of Data Reviewed ?Labs: ordered. Decision-making details documented in ED Course. ?Radiology: ordered and independent interpretation performed. Decision-making details documented in ED Course. ? ? ? ? ? ? ? ? ? ? ?Final Clinical Impression(s) / ED Diagnoses ?Final diagnoses:  ?Feeling jittery  ?Malignant neoplasm of pancreas,  unspecified location of malignancy ([place])  ? ? ?Rx / DC Orders ?ED Discharge Orders   ? ? None  ? ?  ? ? ?  ?Pattricia Boss, MD ?12/18/21 2232 ? ?

## 2021-12-30 ENCOUNTER — Encounter (HOSPITAL_COMMUNITY): Payer: Self-pay

## 2021-12-30 ENCOUNTER — Other Ambulatory Visit: Payer: Self-pay

## 2021-12-30 ENCOUNTER — Emergency Department (HOSPITAL_COMMUNITY)
Admission: EM | Admit: 2021-12-30 | Discharge: 2021-12-31 | Disposition: A | Discharge status: Home / Self Care | Payer: Medicaid Other | Attending: Emergency Medicine | Admitting: Emergency Medicine

## 2021-12-30 DIAGNOSIS — R1084 Generalized abdominal pain: Secondary | ICD-10-CM | POA: Insufficient documentation

## 2021-12-30 DIAGNOSIS — Z8507 Personal history of malignant neoplasm of pancreas: Secondary | ICD-10-CM | POA: Diagnosis not present

## 2021-12-30 DIAGNOSIS — R110 Nausea: Secondary | ICD-10-CM | POA: Insufficient documentation

## 2021-12-30 DIAGNOSIS — D72819 Decreased white blood cell count, unspecified: Secondary | ICD-10-CM | POA: Diagnosis not present

## 2021-12-30 DIAGNOSIS — R630 Anorexia: Secondary | ICD-10-CM | POA: Insufficient documentation

## 2021-12-30 DIAGNOSIS — R109 Unspecified abdominal pain: Secondary | ICD-10-CM | POA: Diagnosis present

## 2021-12-30 DIAGNOSIS — R7401 Elevation of levels of liver transaminase levels: Secondary | ICD-10-CM | POA: Diagnosis not present

## 2021-12-30 NOTE — ED Triage Notes (Signed)
Patient arrived with upper abdominal pain over the last few weeks, states she has a history of pancreatic cancer.  ?

## 2021-12-31 ENCOUNTER — Emergency Department (HOSPITAL_COMMUNITY): Payer: Medicaid Other

## 2021-12-31 ENCOUNTER — Encounter (HOSPITAL_COMMUNITY): Payer: Self-pay

## 2021-12-31 LAB — CBC WITH DIFFERENTIAL/PLATELET
Abs Immature Granulocytes: 0.01 10*3/uL (ref 0.00–0.07)
Basophils Absolute: 0 10*3/uL (ref 0.0–0.1)
Basophils Relative: 1 %
Eosinophils Absolute: 0 10*3/uL (ref 0.0–0.5)
Eosinophils Relative: 0 %
HCT: 33.2 % — ABNORMAL LOW (ref 36.0–46.0)
Hemoglobin: 11.3 g/dL — ABNORMAL LOW (ref 12.0–15.0)
Immature Granulocytes: 0 %
Lymphocytes Relative: 24 %
Lymphs Abs: 0.9 10*3/uL (ref 0.7–4.0)
MCH: 29 pg (ref 26.0–34.0)
MCHC: 34 g/dL (ref 30.0–36.0)
MCV: 85.3 fL (ref 80.0–100.0)
Monocytes Absolute: 0.4 10*3/uL (ref 0.1–1.0)
Monocytes Relative: 12 %
Neutro Abs: 2.2 10*3/uL (ref 1.7–7.7)
Neutrophils Relative %: 63 %
Platelets: 243 10*3/uL (ref 150–400)
RBC: 3.89 MIL/uL (ref 3.87–5.11)
RDW: 15.3 % (ref 11.5–15.5)
WBC: 3.5 10*3/uL — ABNORMAL LOW (ref 4.0–10.5)
nRBC: 0 % (ref 0.0–0.2)

## 2021-12-31 LAB — COMPREHENSIVE METABOLIC PANEL WITH GFR
ALT: 76 U/L — ABNORMAL HIGH (ref 0–44)
AST: 61 U/L — ABNORMAL HIGH (ref 15–41)
Albumin: 3 g/dL — ABNORMAL LOW (ref 3.5–5.0)
Alkaline Phosphatase: 285 U/L — ABNORMAL HIGH (ref 38–126)
Anion gap: 7 (ref 5–15)
BUN: 8 mg/dL (ref 6–20)
CO2: 24 mmol/L (ref 22–32)
Calcium: 8.2 mg/dL — ABNORMAL LOW (ref 8.9–10.3)
Chloride: 98 mmol/L (ref 98–111)
Creatinine, Ser: 0.45 mg/dL (ref 0.44–1.00)
GFR, Estimated: >60 mL/min
Glucose, Bld: 98 mg/dL (ref 70–99)
Potassium: 3.5 mmol/L (ref 3.5–5.1)
Sodium: 129 mmol/L — ABNORMAL LOW (ref 135–145)
Total Bilirubin: 0.5 mg/dL (ref 0.3–1.2)
Total Protein: 6 g/dL — ABNORMAL LOW (ref 6.5–8.1)

## 2021-12-31 LAB — PROTIME-INR
INR: 1 (ref 0.8–1.2)
Prothrombin Time: 12.5 s (ref 11.4–15.2)

## 2021-12-31 LAB — LIPASE, BLOOD: Lipase: 22 U/L (ref 11–51)

## 2021-12-31 MED ORDER — ONDANSETRON 4 MG PO TBDP
4.0000 mg | ORAL_TABLET | Freq: Once | ORAL | Status: AC
Start: 2021-12-31 — End: 2021-12-31
  Administered 2021-12-31: 4 mg via ORAL
  Filled 2021-12-31: qty 1

## 2021-12-31 MED ORDER — PANTOPRAZOLE SODIUM 20 MG PO TBEC: 20.0000 mg | DELAYED_RELEASE_TABLET | Freq: Every day | ORAL | 0 refills | Status: DC

## 2021-12-31 MED ORDER — HYDROMORPHONE HCL 2 MG/ML IJ SOLN
2.0000 mg | INTRAMUSCULAR | Status: AC | PRN
  Administered 2021-12-31: 2 mg via INTRAVENOUS
  Filled 2021-12-31: qty 1

## 2021-12-31 MED ORDER — HYDROMORPHONE HCL 1 MG/ML IJ SOLN
1.0000 mg | Freq: Once | INTRAMUSCULAR | Status: AC
  Administered 2021-12-31: 1 mg via INTRAVENOUS
  Filled 2021-12-31: qty 1

## 2021-12-31 MED ORDER — HEPARIN SOD (PORK) LOCK FLUSH 100 UNIT/ML IV SOLN
INTRAVENOUS | Status: AC
Start: 2021-12-31 — End: 2021-12-31
  Administered 2021-12-31: 500 [IU]
  Filled 2021-12-31: qty 5

## 2021-12-31 MED ORDER — SODIUM CHLORIDE (PF) 0.9 % IJ SOLN
INTRAMUSCULAR | Status: DC
Start: 2021-12-31 — End: 2021-12-31
  Filled 2021-12-31: qty 50

## 2021-12-31 MED ORDER — LIDOCAINE VISCOUS HCL 2 % MT SOLN
15.0000 mL | Freq: Once | OROMUCOSAL | Status: AC
  Administered 2021-12-31: 15 mL via ORAL
  Filled 2021-12-31: qty 15

## 2021-12-31 MED ORDER — IOHEXOL 300 MG/ML  SOLN
80.0000 mL | Freq: Once | INTRAMUSCULAR | Status: AC | PRN
  Administered 2021-12-31: 80 mL via INTRAVENOUS

## 2021-12-31 MED ORDER — ALUM & MAG HYDROXIDE-SIMETH 200-200-20 MG/5ML PO SUSP
30.0000 mL | Freq: Once | ORAL | Status: AC
  Administered 2021-12-31: 30 mL via ORAL
  Filled 2021-12-31: qty 30

## 2021-12-31 MED ORDER — ONDANSETRON HCL 4 MG/2ML IJ SOLN
4.0000 mg | Freq: Once | INTRAMUSCULAR | Status: AC
  Administered 2021-12-31: 4 mg via INTRAVENOUS
  Filled 2021-12-31: qty 2

## 2021-12-31 MED ORDER — SODIUM CHLORIDE 0.9 % IV BOLUS
1000.0000 mL | Freq: Once | INTRAVENOUS | Status: AC
  Administered 2021-12-31: 1000 mL via INTRAVENOUS

## 2021-12-31 NOTE — ED Provider Notes (Signed)
?[facility] DEPT ?Provider Note ? ? ?CSN: [id number] ?Arrival date & time: 12/30/21  2349 ? ?  ? ?History ? ?Chief Complaint  ?Patient presents with  ? Abdominal Pain  ? ? ?Cynthia [name] is a 41 y.o. female. ? ?41 year old female with history of metastatic pancreatic cancer presents with complaint of abdominal pain with nausea and decreased PO intake (secondary to pain and nausea). Reports pain located upper abdomen, worse with eating, denies fevers, chills, changes in bowel or bladder habits. Last chemo 1 week ago. No relief with home narcotic pain medications.  ? ? ?  ? ?Home Medications ?Prior to Admission medications   ?Medication Sig Start Date End Date Taking? Authorizing Provider  ?pantoprazole (PROTONIX) 20 MG tablet Take 1 tablet (20 mg total) by mouth daily. 12/31/21  Yes Tacy Learn, PA-C  ?diazepam (VALIUM) 5 MG tablet Take 1 tablet (5 mg total) by mouth every 8 (eight) hours as needed for muscle spasms. 11/26/21 11/26/22  Florencia Reasons, MD  ?dicyclomine (BENTYL) 20 MG tablet Take 1 tablet (20 mg total) by mouth 2 (two) times daily as needed for up to 7 days for spasms. 11/26/21 12/03/21  Florencia Reasons, MD  ?enoxaparin (LOVENOX) 80 MG/0.8ML injection Inject 80 mg into the skin daily. 05/28/21   [provider]  ?feeding supplement (ENSURE ENLIVE / ENSURE PLUS) LIQD Take 237 mLs by mouth 2 (two) times daily between meals. 11/26/21   Florencia Reasons, MD  ?fentaNYL (DURAGESIC) 100 MCG/HR Place 1 patch onto the skin every 3 (three) days. 11/27/21   Florencia Reasons, MD  ?lidocaine-prilocaine (EMLA) cream Apply 1 application topically as needed (for port access). 05/23/21   [provider]  ?lipase/protease/amylase (CREON) 12000-38000 units CPEP capsule Take 1 capsule (12,000 Units total) by mouth 3 (three) times daily with meals. 11/26/21   Florencia Reasons, MD  ?ondansetron (ZOFRAN-ODT) 8 MG disintegrating tablet Take 8 mg by mouth every 8 (eight) hours as needed for nausea. 07/16/21   [provider]  ?pantoprazole (PROTONIX) 40 MG tablet Take 1 tablet (40 mg total) by mouth 2 (two) times daily. 11/26/21   Florencia Reasons, MD  ?polyethylene glycol (MIRALAX / GLYCOLAX) 17 g packet Take 17 g by mouth 2 (two) times daily. 11/26/21   Florencia Reasons, MD  ?prochlorperazine (COMPAZINE) 10 MG tablet Take 10 mg by mouth every 6 (six) hours as needed for nausea. 07/17/21   [provider]  ?senna-docusate (SENOKOT-S) 8.6-50 MG tablet Take 1 tablet by mouth 2 (two) times daily. 11/26/21   Florencia Reasons, MD  ?Vitamin D, Ergocalciferol, (DRISDOL) 1.25 MG (50000 UNIT) CAPS capsule Take 1 capsule (50,000 Units total) by mouth every 7 (seven) days. 11/28/21   Florencia Reasons, MD  ?   ? ?Allergies    ?Patient has no known allergies.   ? ?Review of Systems   ?Review of Systems ?Negative except as per HPI ?Physical Exam ?Updated Vital Signs ?BP 111/80   Pulse 80   Temp 98.2 ?F (36.8 ?C) (Oral)   Resp 18   SpO2 99%  ?Physical Exam ?Vitals and nursing note reviewed.  ?Constitutional:   ?   General: She is not in acute distress. ?   Appearance: She is cachectic. She is not diaphoretic.  ?HENT:  ?   Head: Normocephalic and atraumatic.  ?Cardiovascular:  ?   Rate and Rhythm: Normal rate and regular rhythm.  ?   Heart sounds: Normal heart sounds.  ?Pulmonary:  ?  Effort: Pulmonary effort is normal.  ?   Breath sounds: Normal breath sounds.  ?Abdominal:  ?   Palpations: Abdomen is soft.  ?   Tenderness: There is generalized abdominal tenderness and tenderness in the left upper quadrant.  ?Skin: ?   General: Skin is warm and dry.  ?   Findings: No erythema or rash.  ?Neurological:  ?   Mental Status: She is alert and oriented to person, place, and time.  ?Psychiatric:     ?   Behavior: Behavior normal.  ? ? ?ED Results / Procedures / Treatments   ?Labs ?(all labs ordered are listed, but only abnormal results are displayed) ?Labs Reviewed  ?COMPREHENSIVE METABOLIC PANEL - Abnormal; Notable for the following components:  ?    Result Value  ?  Sodium 129 (*)   ? Calcium 8.2 (*)   ? Total Protein 6.0 (*)   ? Albumin 3.0 (*)   ? AST 61 (*)   ? ALT 76 (*)   ? Alkaline Phosphatase 285 (*)   ? All other components within normal limits  ?CBC WITH DIFFERENTIAL/PLATELET - Abnormal; Notable for the following components:  ? WBC 3.5 (*)   ? Hemoglobin 11.3 (*)   ? HCT 33.2 (*)   ? All other components within normal limits  ?LIPASE, BLOOD  ?PROTIME-INR  ? ? ?EKG ?None ? ?Radiology ?CT Abdomen Pelvis W Contrast ? ?Result Date: 12/31/2021 ?CLINICAL DATA:  Upper abdominal pain for a few weeks. History of pancreatitis and pancreas cancer, undergoing chemotherapy EXAM: CT ABDOMEN AND PELVIS WITH CONTRAST TECHNIQUE: Multidetector CT imaging of the abdomen and pelvis was performed using the standard protocol following bolus administration of intravenous contrast. RADIATION DOSE REDUCTION: This exam was performed according to the departmental dose-optimization program which includes automated exposure control, adjustment of the mA and/or kV according to patient size and/or use of iterative reconstruction technique. CONTRAST:  70m OMNIPAQUE IOHEXOL 300 MG/ML  SOLN COMPARISON:  11/18/2021 FINDINGS: Lower chest:  No acute finding or nodularity in the lower lungs. Hepatobiliary: 2 low-density nodules in the right liver have increased in size. The more superior measures 17 mm as compared to 13 mm previously. No altered regional enhancement to imply these are from biliary infection. There is a metallic CBD stent in place, spanning the common bile duct to duodenal lumen, patent with gas throughout the stent and seen in the biliary tree. Negativegallbladder. Pancreas: Known infiltrating pancreatic head mass. Progressive low-density expansion at the pancreatic head which is likely some combination of tumor and dilated ducts, low-density area measuring up to 3.2 cm on axial images. Diffuse main pancreatic duct dilatation and parenchymal atrophy. Spleen: Unremarkable. Adrenals/Urinary  Tract: Negative adrenals. No hydronephrosis or stone. Unremarkable bladder. Stomach/Bowel: Diffuse colonic stool retention. No bowel wall thickening or obstructive pattern. Vascular/Lymphatic: No acute finding. Narrowing at the portal vein confluence adjacent to tumor. Cluster of enlarged lymph nodes anterior to the pancreatic head which appear progressed from before, measuring up to 9 mm. Reproductive:No pathologic findings. Other: No ascites or pneumoperitoneum. Musculoskeletal: No acute abnormalities. IMPRESSION: 1. No acute finding. 2. Constipated appearance. 3. Size increase at the pancreatic head mass, right hepatic lesions, and peripancreatic nodes - all suggesting progressive disease. 4. Patent CBD stent. Electronically Signed   By: JJorje GuildM.D.   On: 12/31/2021 05:10   ? ?Procedures ?Procedures  ? ? ?Medications Ordered in ED ?Medications  ?sodium chloride (PF) 0.9 % injection (has no administration in time range)  ?sodium chloride 0.9 %  bolus 1,000 mL (0 mLs Intravenous Stopped 12/31/21 0259)  ?HYDROmorphone (DILAUDID) injection 1 mg (1 mg Intravenous Given 12/31/21 0034)  ?ondansetron [facility]) injection 4 mg (4 mg Intravenous Given 12/31/21 0034)  ?HYDROmorphone (DILAUDID) injection 1 mg (1 mg Intravenous Given 12/31/21 0149)  ?heparin lock flush 100 UNIT/ML injection (500 Units  Given 12/31/21 0535)  ?HYDROmorphone (DILAUDID) injection 2 mg (2 mg Intravenous Given 12/31/21 0351)  ?iohexol (OMNIPAQUE) 300 MG/ML solution 80 mL (80 mLs Intravenous Contrast Given 12/31/21 0446)  ?alum & mag hydroxide-simeth (MAALOX/MYLANTA) 200-200-20 MG/5ML suspension 30 mL (30 mLs Oral Given 12/31/21 0533)  ?  And  ?lidocaine (XYLOCAINE) 2 % viscous mouth solution 15 mL (15 mLs Oral Given 12/31/21 0533)  ? ? ?ED Course/ Medical Decision Making/ A&P ?  ?                        ?Medical Decision Making ?Amount and/or Complexity of Data Reviewed ?Labs: ordered. ? ?Risk ?Prescription drug management. ? ? ?This patient presents  to the ED for concern of abdominal pain and nausea, not controlled with home meds, this involves an extensive number of treatment options, and is a complaint that carries with it a high risk of complicati

## 2021-12-31 NOTE — Discharge Instructions (Addendum)
Call your care team this morning to discuss worsening pain and further management.  Prescription for Protonix, start this prescription after talking to your care team if they agree with this plan.  Return to the emergency room for fever, any worsening or concerning symptoms. ?

## 2022-01-18 ENCOUNTER — Emergency Department (HOSPITAL_COMMUNITY): Payer: Medicaid Other

## 2022-01-18 ENCOUNTER — Encounter (HOSPITAL_COMMUNITY): Payer: Self-pay

## 2022-01-18 ENCOUNTER — Emergency Department (HOSPITAL_COMMUNITY)
Admission: EM | Admit: 2022-01-18 | Discharge: 2022-01-18 | Disposition: A | Discharge status: Home / Self Care | Payer: Medicaid Other | Attending: Student | Admitting: Student

## 2022-01-18 ENCOUNTER — Other Ambulatory Visit: Payer: Self-pay

## 2022-01-18 DIAGNOSIS — R1013 Epigastric pain: Secondary | ICD-10-CM | POA: Diagnosis present

## 2022-01-18 DIAGNOSIS — R7401 Elevation of levels of liver transaminase levels: Secondary | ICD-10-CM | POA: Insufficient documentation

## 2022-01-18 DIAGNOSIS — R1012 Left upper quadrant pain: Secondary | ICD-10-CM | POA: Insufficient documentation

## 2022-01-18 DIAGNOSIS — R7989 Other specified abnormal findings of blood chemistry: Secondary | ICD-10-CM

## 2022-01-18 DIAGNOSIS — R1011 Right upper quadrant pain: Secondary | ICD-10-CM | POA: Insufficient documentation

## 2022-01-18 DIAGNOSIS — Z8507 Personal history of malignant neoplasm of pancreas: Secondary | ICD-10-CM | POA: Insufficient documentation

## 2022-01-18 DIAGNOSIS — R109 Unspecified abdominal pain: Secondary | ICD-10-CM

## 2022-01-18 LAB — LIPASE, BLOOD: Lipase: 21 U/L (ref 11–51)

## 2022-01-18 LAB — CBC
HCT: 33.3 % — ABNORMAL LOW (ref 36.0–46.0)
Hemoglobin: 11.3 g/dL — ABNORMAL LOW (ref 12.0–15.0)
MCH: 29.7 pg (ref 26.0–34.0)
MCHC: 33.9 g/dL (ref 30.0–36.0)
MCV: 87.4 fL (ref 80.0–100.0)
Platelets: 306 10*3/uL (ref 150–400)
RBC: 3.81 MIL/uL — ABNORMAL LOW (ref 3.87–5.11)
RDW: 17.7 % — ABNORMAL HIGH (ref 11.5–15.5)
WBC: 11.3 10*3/uL — ABNORMAL HIGH (ref 4.0–10.5)
nRBC: 0 % (ref 0.0–0.2)

## 2022-01-18 LAB — I-STAT BETA HCG BLOOD, ED (MC, WL, AP ONLY): I-stat hCG, quantitative: 16 m[IU]/mL — ABNORMAL HIGH (ref <5)

## 2022-01-18 LAB — URINALYSIS, ROUTINE W REFLEX MICROSCOPIC
Bilirubin Urine: NEGATIVE
Glucose, UA: NEGATIVE mg/dL
Hgb urine dipstick: NEGATIVE
Ketones, ur: NEGATIVE mg/dL
Leukocytes,Ua: NEGATIVE
Nitrite: NEGATIVE
Protein, ur: NEGATIVE mg/dL
Specific Gravity, Urine: 1.014 (ref 1.005–1.030)
pH: 6 (ref 5.0–8.0)

## 2022-01-18 LAB — COMPREHENSIVE METABOLIC PANEL
ALT: 168 U/L — ABNORMAL HIGH (ref 0–44)
AST: 609 U/L — ABNORMAL HIGH (ref 15–41)
Albumin: 3.2 g/dL — ABNORMAL LOW (ref 3.5–5.0)
Alkaline Phosphatase: 528 U/L — ABNORMAL HIGH (ref 38–126)
Anion gap: 6 (ref 5–15)
BUN: 9 mg/dL (ref 6–20)
CO2: 28 mmol/L (ref 22–32)
Calcium: 8.6 mg/dL — ABNORMAL LOW (ref 8.9–10.3)
Chloride: 103 mmol/L (ref 98–111)
Creatinine, Ser: 0.52 mg/dL (ref 0.44–1.00)
GFR, Estimated: >60 mL/min (ref >60)
Glucose, Bld: 110 mg/dL — ABNORMAL HIGH (ref 70–99)
Potassium: 4.2 mmol/L (ref 3.5–5.1)
Sodium: 137 mmol/L (ref 135–145)
Total Bilirubin: 0.9 mg/dL (ref 0.3–1.2)
Total Protein: 6.1 g/dL — ABNORMAL LOW (ref 6.5–8.1)

## 2022-01-18 LAB — PREGNANCY, URINE: Preg Test, Ur: NEGATIVE

## 2022-01-18 MED ORDER — SODIUM CHLORIDE (PF) 0.9 % IJ SOLN
INTRAMUSCULAR | Status: AC
  Filled 2022-01-18: qty 50

## 2022-01-18 MED ORDER — HEPARIN SOD (PORK) LOCK FLUSH 100 UNIT/ML IV SOLN
500.0000 [IU] | Freq: Once | INTRAVENOUS | Status: AC
  Administered 2022-01-18: 500 [IU]
  Filled 2022-01-18: qty 5

## 2022-01-18 MED ORDER — METOCLOPRAMIDE HCL 5 MG/ML IJ SOLN
10.0000 mg | Freq: Once | INTRAMUSCULAR | Status: AC
Start: 2022-01-18 — End: 2022-01-18
  Administered 2022-01-18: 10 mg via INTRAVENOUS
  Filled 2022-01-18: qty 2

## 2022-01-18 MED ORDER — MORPHINE SULFATE ER 15 MG PO T12A: 15.0000 mg | EXTENDED_RELEASE_TABLET | Freq: Four times a day (QID) | ORAL | 0 refills | Status: DC | PRN

## 2022-01-18 MED ORDER — ONDANSETRON HCL 4 MG/2ML IJ SOLN
4.0000 mg | Freq: Once | INTRAMUSCULAR | Status: AC
  Administered 2022-01-18: 4 mg via INTRAVENOUS
  Filled 2022-01-18: qty 2

## 2022-01-18 MED ORDER — MORPHINE SULFATE ER 15 MG PO T12A: 15.0000 mg | EXTENDED_RELEASE_TABLET | Freq: Two times a day (BID) | ORAL | 0 refills | Status: DC

## 2022-01-18 MED ORDER — SODIUM CHLORIDE 0.9 % IV BOLUS
1000.0000 mL | Freq: Once | INTRAVENOUS | Status: AC
  Administered 2022-01-18: 1000 mL via INTRAVENOUS

## 2022-01-18 MED ORDER — HYDROMORPHONE HCL 1 MG/ML IJ SOLN
1.0000 mg | INTRAMUSCULAR | Status: AC | PRN
  Administered 2022-01-18 (×3): 1 mg via INTRAVENOUS
  Filled 2022-01-18 (×3): qty 1

## 2022-01-18 MED ORDER — IOHEXOL 300 MG/ML  SOLN
100.0000 mL | Freq: Once | INTRAMUSCULAR | Status: AC | PRN
  Administered 2022-01-18: 100 mL via INTRAVENOUS

## 2022-01-18 MED ORDER — HYDROMORPHONE HCL 1 MG/ML IJ SOLN
1.0000 mg | Freq: Once | INTRAMUSCULAR | Status: AC
  Administered 2022-01-18: 1 mg via INTRAVENOUS
  Filled 2022-01-18: qty 1

## 2022-01-18 MED ORDER — MORPHINE SULFATE 30 MG PO TABS
30.0000 mg | ORAL_TABLET | ORAL | 0 refills | Status: DC | PRN
Start: 2022-01-18 — End: 2022-01-18

## 2022-01-18 NOTE — ED Provider Notes (Signed)
? ? ?  Physical Exam  ?BP 98/69   Pulse 87   Temp 98.8 ?F (37.1 ?C) (Oral)   Resp 16   Ht '5\' 5"'$  (1.651 m)   Wt 44.5 kg   SpO2 98%   BMI 16.31 kg/m?  ? ?Physical Exam ? ?Procedures  ?Procedures ? ?ED Course / MDM  ?  ?Medical Decision Making ?Amount and/or Complexity of Data Reviewed ?Labs: ordered. ?Radiology: ordered. ? ?Risk ?Prescription drug management. ? ? ?Patient signed out to me at shift change.  Please see previous provider note for further details of the event. ? ?In short, this is a 41 year old female with history of pancreatic cancer currently undergoing treatment at [facility].  Patient's most recent treatment was on 01/06/2022 with Gemzar/Cisplatin.  Patient presents to ED today for evaluation of uncontrolled upper abdominal pain.  Patient states pain is in epigastric with radiation to back.  Patient states that symptoms worsened last night.  Patient has at home pain medication regimen of MS Contin, IR morphine for pain control.  Patient also has fentanyl patches however has not used them yet. ? ?Currently, awaiting callback from [facility] oncology.  Patient has elevated LFTs which are worrisome for acute hepatitis.  Plan will be either transfer to [facility] for management versus follow-up outpatient. ? ?Spoke with Dr. Julien Girt of [facility] who stated that this patient could be followed up within the office this week.  They advised that the patient's [facility] cancer specialist will reach out to her to schedule an appointment. ? ?Patient agreeable to plan.  Patient will be sent home with pain medication.  Patient advised return precautions and voiced understanding.  Patient stable for discharge. ? ? ? ? ? ? ?  ?Azucena Cecil, PA-C ?01/18/22 1839 ? ?  ?Malvin Johns, MD ?01/18/22 1900 ? ?

## 2022-01-18 NOTE — ED Triage Notes (Signed)
Patient c/o RUQ abdominal pain that radiates into the back since last night.. Patient denies any n/v/d. ?

## 2022-01-18 NOTE — Discharge Instructions (Addendum)
Please follow-up with your [facility] cancer doctor this week in the office ?Please pick up prescription medication I have sent in for you ?

## 2022-01-18 NOTE — ED Provider Notes (Signed)
?[facility] DEPT ?Provider Note ? ? ?CSN: [id number] ?Arrival date & time: 01/18/22  1039 ? ?  ? ?History ? ?Chief Complaint  ?Patient presents with  ? Abdominal Pain  ? Back Pain  ? ? ?Cynthia [name] is a 41 y.o. female. ? ?Patient with history of pancreatic cancer, currently undergoing treatment at [facility], most recent treatment was 01/06/22 (Gemzar/Cisplatin), PE on lovenox, h/o pancreatitis -- presents to the emergency department today for uncontrolled pain.  Pain is in the epigastrium with radiation to the back.  Symptoms became worse last night.  No associated fever, vomiting, or diarrhea.  No chest pain or shortness of breath.  Patient reports recently being on MS Contin and immediate release morphine for pain control.  She also endorses being prescribed fentanyl patches which she has not yet started to use.  She states that currently she only has immediate release morphine.  Pain is typical for cancer pain. ? ? ?  ? ?Home Medications ?Prior to Admission medications   ?Medication Sig Start Date End Date Taking? Authorizing Provider  ?diazepam (VALIUM) 5 MG tablet Take 1 tablet (5 mg total) by mouth every 8 (eight) hours as needed for muscle spasms. 11/26/21 11/26/22  Florencia Reasons, MD  ?dicyclomine (BENTYL) 20 MG tablet Take 1 tablet (20 mg total) by mouth 2 (two) times daily as needed for up to 7 days for spasms. 11/26/21 12/03/21  Florencia Reasons, MD  ?enoxaparin (LOVENOX) 80 MG/0.8ML injection Inject 80 mg into the skin daily. 05/28/21   [provider]  ?feeding supplement (ENSURE ENLIVE / ENSURE PLUS) LIQD Take 237 mLs by mouth 2 (two) times daily between meals. 11/26/21   Florencia Reasons, MD  ?fentaNYL (DURAGESIC) 100 MCG/HR Place 1 patch onto the skin every 3 (three) days. 11/27/21   Florencia Reasons, MD  ?lidocaine-prilocaine (EMLA) cream Apply 1 application topically as needed (for port access). 05/23/21   [provider]  ?lipase/protease/amylase (CREON) 12000-38000 units CPEP capsule Take  1 capsule (12,000 Units total) by mouth 3 (three) times daily with meals. 11/26/21   Florencia Reasons, MD  ?ondansetron (ZOFRAN-ODT) 8 MG disintegrating tablet Take 8 mg by mouth every 8 (eight) hours as needed for nausea. 07/16/21   [provider]  ?pantoprazole (PROTONIX) 20 MG tablet Take 1 tablet (20 mg total) by mouth daily. 12/31/21   Tacy Learn, PA-C  ?pantoprazole (PROTONIX) 40 MG tablet Take 1 tablet (40 mg total) by mouth 2 (two) times daily. 11/26/21   Florencia Reasons, MD  ?polyethylene glycol (MIRALAX / GLYCOLAX) 17 g packet Take 17 g by mouth 2 (two) times daily. 11/26/21   Florencia Reasons, MD  ?prochlorperazine (COMPAZINE) 10 MG tablet Take 10 mg by mouth every 6 (six) hours as needed for nausea. 07/17/21   [provider]  ?senna-docusate (SENOKOT-S) 8.6-50 MG tablet Take 1 tablet by mouth 2 (two) times daily. 11/26/21   Florencia Reasons, MD  ?Vitamin D, Ergocalciferol, (DRISDOL) 1.25 MG (50000 UNIT) CAPS capsule Take 1 capsule (50,000 Units total) by mouth every 7 (seven) days. 11/28/21   Florencia Reasons, MD  ?   ? ?Allergies    ?Patient has no known allergies.   ? ?Review of Systems   ?Review of Systems ? ?Physical Exam ?Updated Vital Signs ?BP 109/73 (BP Location: Right Arm)   Pulse 92   Temp 98.8 ?F (37.1 ?C) (Oral)   Resp 18   Ht 5' 5" (1.651 m)   Wt 44.5 kg   SpO2 99%  BMI 16.31 kg/m?  ? ?Physical Exam ?Vitals and nursing note reviewed.  ?Constitutional:   ?   General: She is not in acute distress. ?   Appearance: She is well-developed and underweight.  ?HENT:  ?   Head: Normocephalic and atraumatic.  ?   Right Ear: External ear normal.  ?   Left Ear: External ear normal.  ?   Nose: Nose normal.  ?Eyes:  ?   Conjunctiva/sclera: Conjunctivae normal.  ?Cardiovascular:  ?   Rate and Rhythm: Normal rate and regular rhythm.  ?   Heart sounds: No murmur heard. ?Pulmonary:  ?   Effort: No respiratory distress.  ?   Breath sounds: No wheezing, rhonchi or rales.  ?Chest:  ? ? ?Abdominal:  ?   Palpations: Abdomen is  soft.  ?   Tenderness: There is abdominal tenderness in the right upper quadrant, epigastric area and left upper quadrant. There is no guarding or rebound.  ?Musculoskeletal:  ?   Cervical back: Normal range of motion and neck supple.  ?   Right lower leg: No edema.  ?   Left lower leg: No edema.  ?Skin: ?   General: Skin is warm and dry.  ?   Coloration: Skin is pale.  ?   Findings: No rash.  ?Neurological:  ?   General: No focal deficit present.  ?   Mental Status: She is alert. Mental status is at baseline.  ?   Motor: No weakness.  ?Psychiatric:     ?   Mood and Affect: Mood normal.  ? ? ?ED Results / Procedures / Treatments   ?Labs ?(all labs ordered are listed, but only abnormal results are displayed) ?Labs Reviewed  ?COMPREHENSIVE METABOLIC PANEL - Abnormal; Notable for the following components:  ?    Result Value  ? Glucose, Bld 110 (*)   ? Calcium 8.6 (*)   ? Total Protein 6.1 (*)   ? Albumin 3.2 (*)   ? AST 609 (*)   ? ALT 168 (*)   ? Alkaline Phosphatase 528 (*)   ? All other components within normal limits  ?CBC - Abnormal; Notable for the following components:  ? WBC 11.3 (*)   ? RBC 3.81 (*)   ? Hemoglobin 11.3 (*)   ? HCT 33.3 (*)   ? RDW 17.7 (*)   ? All other components within normal limits  ?URINALYSIS, ROUTINE W REFLEX MICROSCOPIC - Abnormal; Notable for the following components:  ? APPearance HAZY (*)   ? All other components within normal limits  ?I-STAT BETA HCG BLOOD, ED ([facility], [facility], [facility] ONLY) - Abnormal; Notable for the following components:  ? I-stat hCG, quantitative 16.0 (*)   ? All other components within normal limits  ?LIPASE, BLOOD  ?PREGNANCY, URINE  ? ? ?EKG ?None ? ?Radiology ?No results found. ? ?Procedures ?Procedures  ? ? ?Medications Ordered in ED ?Medications  ?sodium chloride 0.9 % bolus 1,000 mL (has no administration in time range)  ?HYDROmorphone (DILAUDID) injection 1 mg (has no administration in time range)  ?ondansetron (ZOFRAN) injection 4 mg (has no administration in time  range)  ? ? ?ED Course/ Medical Decision Making/ A&P ?  ? ?Patient seen and examined. History obtained directly from patient.  Also reviewed external medical oncology and GI clinic notes from [facility] and recent infusion records.  Reviewed previous ED visits.  Work-up including labs, imaging, EKG ordered in triage, if performed, were reviewed.   ? ?Labs/EKG: Independently reviewed and  interpreted.  This included:  ? ?Imaging: Independently visualized and interpreted.  This included: I-STAT beta-hCG 16 likely false positive in setting of cancer, urine pregnancy ordered; CBC white blood cell count 11,000, hemoglobin 11 appears stable; CMP AST elevated today to 609, ALT mildly elevated from baseline at 168, alk phos elevated from baseline 528, normal electrolytes and kidney function; lipase normal. ? ?Medications/Fluids: Ordered: IV Dilaudid, Zofran, fluids  ? ?Most recent vital signs reviewed and are as follows: ?BP 109/73 (BP Location: Right Arm)   Pulse 92   Temp 98.8 ?F (37.1 ?C) (Oral)   Resp 18   Ht 5' 5" (1.651 m)   Wt 44.5 kg   SpO2 99%   BMI 16.31 kg/m?  ? ?Initial impression: Chronic abdominal pain due to pancreatic adenocarcinoma, elevated AST today and more elevated ALT and alk phos with normal bilirubin. ? ?3:33 PM Reassessment performed. Patient appears comfortable. Receiving another dose of pain medication and IV reglan. Patient seen earlier by Dr. Tinnie Gens. Awaiting CT results. ? ?Imaging personally visualized and interpreted including: CT imaging pending.  ? ?Also urine preg neg and UA without obvious infection.  ? ?Reviewed pertinent lab work and imaging with patient at bedside. Questions answered.  ? ?Most current vital signs reviewed and are as follows: ?BP 96/67 (BP Location: Right Arm)   Pulse 93   Temp 98.8 ?F (37.1 ?C) (Oral)   Resp 16   Ht 5' 5" (1.651 m)   Wt 44.5 kg   SpO2 100%   BMI 16.31 kg/m?  ? ?Plan: Additional symptom control and CT results.  ? ?3:41 PM CT imaging  personally reviewed and interpreted. Appears stable from 12/2021.  ? ?Discussed with Dr. Matilde Sprang. Will attempt to contact [facility] oncology for reccs and to formulate discharge plan.  ? ?4:40 PM Continuing to await contact from [facility]

## 2022-02-20 ENCOUNTER — Other Ambulatory Visit: Payer: Self-pay

## 2022-02-20 ENCOUNTER — Encounter (HOSPITAL_COMMUNITY): Payer: Self-pay | Admitting: Emergency Medicine

## 2022-02-20 ENCOUNTER — Inpatient Hospital Stay (HOSPITAL_COMMUNITY)
Admission: EM | Admit: 2022-02-20 | Discharge: 2022-02-21 | DRG: 441 | Disposition: A | Discharge status: Other Acute Inpatient Hospital | Payer: Medicaid Other | Attending: Internal Medicine | Admitting: Internal Medicine

## 2022-02-20 DIAGNOSIS — Z8249 Family history of ischemic heart disease and other diseases of the circulatory system: Secondary | ICD-10-CM

## 2022-02-20 DIAGNOSIS — R601 Generalized edema: Secondary | ICD-10-CM | POA: Diagnosis present

## 2022-02-20 DIAGNOSIS — Z800 Family history of malignant neoplasm of digestive organs: Secondary | ICD-10-CM

## 2022-02-20 DIAGNOSIS — Z801 Family history of malignant neoplasm of trachea, bronchus and lung: Secondary | ICD-10-CM

## 2022-02-20 DIAGNOSIS — K55069 Acute infarction of intestine, part and extent unspecified: Secondary | ICD-10-CM | POA: Diagnosis present

## 2022-02-20 DIAGNOSIS — I81 Portal vein thrombosis: Principal | ICD-10-CM

## 2022-02-20 DIAGNOSIS — F419 Anxiety disorder, unspecified: Secondary | ICD-10-CM | POA: Diagnosis present

## 2022-02-20 DIAGNOSIS — Z8673 Personal history of transient ischemic attack (TIA), and cerebral infarction without residual deficits: Secondary | ICD-10-CM

## 2022-02-20 DIAGNOSIS — G893 Neoplasm related pain (acute) (chronic): Secondary | ICD-10-CM | POA: Diagnosis present

## 2022-02-20 DIAGNOSIS — K831 Obstruction of bile duct: Secondary | ICD-10-CM | POA: Diagnosis present

## 2022-02-20 DIAGNOSIS — Z79899 Other long term (current) drug therapy: Secondary | ICD-10-CM

## 2022-02-20 DIAGNOSIS — Z8507 Personal history of malignant neoplasm of pancreas: Secondary | ICD-10-CM

## 2022-02-20 DIAGNOSIS — Z86711 Personal history of pulmonary embolism: Secondary | ICD-10-CM | POA: Diagnosis present

## 2022-02-20 DIAGNOSIS — C259 Malignant neoplasm of pancreas, unspecified: Secondary | ICD-10-CM | POA: Diagnosis present

## 2022-02-20 DIAGNOSIS — Z20822 Contact with and (suspected) exposure to covid-19: Secondary | ICD-10-CM | POA: Diagnosis present

## 2022-02-20 DIAGNOSIS — F17210 Nicotine dependence, cigarettes, uncomplicated: Secondary | ICD-10-CM | POA: Diagnosis present

## 2022-02-20 MED ORDER — ONDANSETRON HCL 4 MG/2ML IJ SOLN
4.0000 mg | Freq: Once | INTRAMUSCULAR | Status: AC
  Administered 2022-02-21: 4 mg via INTRAVENOUS
  Filled 2022-02-20: qty 2

## 2022-02-20 MED ORDER — HYDROMORPHONE HCL 1 MG/ML IJ SOLN
1.0000 mg | Freq: Once | INTRAMUSCULAR | Status: AC
  Administered 2022-02-21: 1 mg via INTRAVENOUS
  Filled 2022-02-20: qty 1

## 2022-02-20 MED ORDER — SODIUM CHLORIDE 0.9 % IV BOLUS
1000.0000 mL | Freq: Once | INTRAVENOUS | Status: AC
Start: 2022-02-21 — End: 2022-02-21
  Administered 2022-02-21: 1000 mL via INTRAVENOUS

## 2022-02-20 NOTE — ED Triage Notes (Signed)
   Patient comes in with abdominal pain and back pain that has been going on for 2 weeks but got worse today.  Patient has hx of pancreatic cancer and had fluid buildup in stomach that required stent placement two weeks ago.  Patient is unable to get comfortable and having sharp pain near stent placement.  Pain 10/10.  No N/V.

## 2022-02-21 ENCOUNTER — Emergency Department (HOSPITAL_COMMUNITY): Payer: Medicaid Other

## 2022-02-21 DIAGNOSIS — G893 Neoplasm related pain (acute) (chronic): Secondary | ICD-10-CM

## 2022-02-21 DIAGNOSIS — Z8249 Family history of ischemic heart disease and other diseases of the circulatory system: Secondary | ICD-10-CM | POA: Diagnosis not present

## 2022-02-21 DIAGNOSIS — I81 Portal vein thrombosis: Secondary | ICD-10-CM | POA: Diagnosis present

## 2022-02-21 DIAGNOSIS — K831 Obstruction of bile duct: Secondary | ICD-10-CM | POA: Diagnosis present

## 2022-02-21 DIAGNOSIS — R601 Generalized edema: Secondary | ICD-10-CM | POA: Diagnosis present

## 2022-02-21 DIAGNOSIS — Z800 Family history of malignant neoplasm of digestive organs: Secondary | ICD-10-CM | POA: Diagnosis not present

## 2022-02-21 DIAGNOSIS — Z20822 Contact with and (suspected) exposure to covid-19: Secondary | ICD-10-CM | POA: Diagnosis present

## 2022-02-21 DIAGNOSIS — Z8507 Personal history of malignant neoplasm of pancreas: Secondary | ICD-10-CM | POA: Diagnosis not present

## 2022-02-21 DIAGNOSIS — F17210 Nicotine dependence, cigarettes, uncomplicated: Secondary | ICD-10-CM | POA: Diagnosis present

## 2022-02-21 DIAGNOSIS — Z86711 Personal history of pulmonary embolism: Secondary | ICD-10-CM | POA: Diagnosis not present

## 2022-02-21 DIAGNOSIS — Z801 Family history of malignant neoplasm of trachea, bronchus and lung: Secondary | ICD-10-CM | POA: Diagnosis not present

## 2022-02-21 DIAGNOSIS — K55069 Acute infarction of intestine, part and extent unspecified: Secondary | ICD-10-CM | POA: Diagnosis present

## 2022-02-21 DIAGNOSIS — C259 Malignant neoplasm of pancreas, unspecified: Secondary | ICD-10-CM | POA: Diagnosis present

## 2022-02-21 DIAGNOSIS — Z79899 Other long term (current) drug therapy: Secondary | ICD-10-CM | POA: Diagnosis not present

## 2022-02-21 DIAGNOSIS — F419 Anxiety disorder, unspecified: Secondary | ICD-10-CM | POA: Diagnosis present

## 2022-02-21 DIAGNOSIS — Z8673 Personal history of transient ischemic attack (TIA), and cerebral infarction without residual deficits: Secondary | ICD-10-CM | POA: Diagnosis not present

## 2022-02-21 LAB — CBC
HCT: 29.3 % — ABNORMAL LOW (ref 36.0–46.0)
HCT: 30.8 % — ABNORMAL LOW (ref 36.0–46.0)
Hemoglobin: 10.4 g/dL — ABNORMAL LOW (ref 12.0–15.0)
Hemoglobin: 9.8 g/dL — ABNORMAL LOW (ref 12.0–15.0)
MCH: 29.4 pg (ref 26.0–34.0)
MCH: 29.6 pg (ref 26.0–34.0)
MCHC: 33.4 g/dL (ref 30.0–36.0)
MCHC: 33.8 g/dL (ref 30.0–36.0)
MCV: 87 fL (ref 80.0–100.0)
MCV: 88.5 fL (ref 80.0–100.0)
Platelets: 238 10*3/uL (ref 150–400)
Platelets: 240 10*3/uL (ref 150–400)
RBC: 3.31 MIL/uL — ABNORMAL LOW (ref 3.87–5.11)
RBC: 3.54 MIL/uL — ABNORMAL LOW (ref 3.87–5.11)
RDW: 17.2 % — ABNORMAL HIGH (ref 11.5–15.5)
RDW: 17.2 % — ABNORMAL HIGH (ref 11.5–15.5)
WBC: 4.4 10*3/uL (ref 4.0–10.5)
WBC: 4.9 10*3/uL (ref 4.0–10.5)
nRBC: 0 % (ref 0.0–0.2)
nRBC: 0 % (ref 0.0–0.2)

## 2022-02-21 LAB — URINALYSIS, ROUTINE W REFLEX MICROSCOPIC
Bilirubin Urine: NEGATIVE
Glucose, UA: NEGATIVE mg/dL
Hgb urine dipstick: NEGATIVE
Ketones, ur: NEGATIVE mg/dL
Leukocytes,Ua: NEGATIVE
Nitrite: NEGATIVE
Protein, ur: NEGATIVE mg/dL
Specific Gravity, Urine: 1.012 (ref 1.005–1.030)
pH: 7 (ref 5.0–8.0)

## 2022-02-21 LAB — HEPARIN LEVEL (UNFRACTIONATED)
Heparin Unfractionated: 0.38 IU/mL (ref 0.30–0.70)
Heparin Unfractionated: 0.32 IU/mL (ref 0.30–0.70)

## 2022-02-21 LAB — COMPREHENSIVE METABOLIC PANEL
ALT: 23 U/L (ref 0–44)
AST: 26 U/L (ref 15–41)
Albumin: 2.5 g/dL — ABNORMAL LOW (ref 3.5–5.0)
Alkaline Phosphatase: 195 U/L — ABNORMAL HIGH (ref 38–126)
Anion gap: 3 — ABNORMAL LOW (ref 5–15)
BUN: 7 mg/dL (ref 6–20)
CO2: 27 mmol/L (ref 22–32)
Calcium: 8.1 mg/dL — ABNORMAL LOW (ref 8.9–10.3)
Chloride: 108 mmol/L (ref 98–111)
Creatinine, Ser: 0.35 mg/dL — ABNORMAL LOW (ref 0.44–1.00)
GFR, Estimated: >60 mL/min (ref >60)
Glucose, Bld: 103 mg/dL — ABNORMAL HIGH (ref 70–99)
Potassium: 3.5 mmol/L (ref 3.5–5.1)
Sodium: 138 mmol/L (ref 135–145)
Total Bilirubin: 0.7 mg/dL (ref 0.3–1.2)
Total Protein: 5 g/dL — ABNORMAL LOW (ref 6.5–8.1)

## 2022-02-21 LAB — PREGNANCY, URINE: Preg Test, Ur: NEGATIVE

## 2022-02-21 LAB — PROTIME-INR
INR: 1.2 (ref 0.8–1.2)
Prothrombin Time: 14.9 seconds (ref 11.4–15.2)

## 2022-02-21 LAB — LIPASE, BLOOD: Lipase: 17 U/L (ref 11–51)

## 2022-02-21 LAB — I-STAT BETA HCG BLOOD, ED (MC, WL, AP ONLY): I-stat hCG, quantitative: 31.1 m[IU]/mL — ABNORMAL HIGH (ref <5)

## 2022-02-21 LAB — HIV ANTIBODY (ROUTINE TESTING W REFLEX): HIV Screen 4th Generation wRfx: NONREACTIVE

## 2022-02-21 LAB — SARS CORONAVIRUS 2 BY RT PCR: SARS Coronavirus 2 by RT PCR: NEGATIVE

## 2022-02-21 MED ORDER — HYDROMORPHONE HCL 1 MG/ML IJ SOLN: 1.0000 mg | Freq: Once | INTRAMUSCULAR | Status: DC

## 2022-02-21 MED ORDER — ENSURE ENLIVE PO LIQD
237.0000 mL | Freq: Two times a day (BID) | ORAL | Status: DC
Start: 2022-02-21 — End: 2022-02-22

## 2022-02-21 MED ORDER — HYDROMORPHONE 1 MG/ML IV SOLN
INTRAVENOUS | Status: DC
  Administered 2022-02-21: 2.5 mg via INTRAVENOUS
  Administered 2022-02-21: 30 mg via INTRAVENOUS
  Filled 2022-02-21: qty 30

## 2022-02-21 MED ORDER — IOHEXOL 300 MG/ML  SOLN
80.0000 mL | Freq: Once | INTRAMUSCULAR | Status: AC | PRN
  Administered 2022-02-21: 80 mL via INTRAVENOUS

## 2022-02-21 MED ORDER — MORPHINE SULFATE ER 15 MG PO TBCR
60.0000 mg | EXTENDED_RELEASE_TABLET | Freq: Three times a day (TID) | ORAL | Status: DC
  Administered 2022-02-21: 60 mg via ORAL
  Filled 2022-02-21: qty 4

## 2022-02-21 MED ORDER — HYDROMORPHONE HCL 2 MG/ML IJ SOLN
2.0000 mg | INTRAMUSCULAR | Status: DC | PRN
  Administered 2022-02-21 (×2): 2 mg via INTRAVENOUS
  Filled 2022-02-21 (×2): qty 1

## 2022-02-21 MED ORDER — HYDROMORPHONE HCL 1 MG/ML IJ SOLN: 0.5000 mg | INTRAMUSCULAR | Status: DC | PRN

## 2022-02-21 MED ORDER — OXYCODONE HCL 5 MG PO TABS
5.0000 mg | ORAL_TABLET | ORAL | Status: DC | PRN
  Administered 2022-02-21: 5 mg via ORAL
  Filled 2022-02-21: qty 1

## 2022-02-21 MED ORDER — HYDROMORPHONE HCL 1 MG/ML IJ SOLN
1.0000 mg | Freq: Once | INTRAMUSCULAR | Status: AC
  Administered 2022-02-21: 1 mg via INTRAVENOUS
  Filled 2022-02-21: qty 1

## 2022-02-21 MED ORDER — ACETAMINOPHEN 650 MG RE SUPP: 650.0000 mg | Freq: Four times a day (QID) | RECTAL | Status: DC | PRN

## 2022-02-21 MED ORDER — ONDANSETRON 8 MG PO TBDP: 8.0000 mg | ORAL_TABLET | Freq: Three times a day (TID) | ORAL | Status: DC | PRN

## 2022-02-21 MED ORDER — DIPHENHYDRAMINE HCL 12.5 MG/5ML PO ELIX: 12.5000 mg | ORAL_SOLUTION | Freq: Four times a day (QID) | ORAL | Status: DC | PRN

## 2022-02-21 MED ORDER — ONDANSETRON HCL 4 MG PO TABS: 4.0000 mg | ORAL_TABLET | Freq: Four times a day (QID) | ORAL | Status: DC | PRN

## 2022-02-21 MED ORDER — NALOXONE HCL 0.4 MG/ML IJ SOLN: 0.4000 mg | INTRAMUSCULAR | Status: DC | PRN

## 2022-02-21 MED ORDER — HYDROMORPHONE HCL 2 MG/ML IJ SOLN
3.0000 mg | Freq: Once | INTRAMUSCULAR | Status: AC
  Administered 2022-02-21: 3 mg via INTRAVENOUS
  Filled 2022-02-21: qty 2

## 2022-02-21 MED ORDER — PANTOPRAZOLE SODIUM 40 MG PO TBEC: 40.0000 mg | DELAYED_RELEASE_TABLET | Freq: Two times a day (BID) | ORAL | Status: DC

## 2022-02-21 MED ORDER — ACETAMINOPHEN 325 MG PO TABS: 650.0000 mg | ORAL_TABLET | Freq: Four times a day (QID) | ORAL | Status: DC | PRN

## 2022-02-21 MED ORDER — CHLORHEXIDINE GLUCONATE CLOTH 2 % EX PADS: 6.0000 | MEDICATED_PAD | Freq: Every day | CUTANEOUS | Status: DC

## 2022-02-21 MED ORDER — SODIUM CHLORIDE 0.9% FLUSH: 9.0000 mL | INTRAVENOUS | Status: DC | PRN

## 2022-02-21 MED ORDER — CELECOXIB 200 MG PO CAPS
200.0000 mg | ORAL_CAPSULE | Freq: Two times a day (BID) | ORAL | Status: DC
  Filled 2022-02-21: qty 1

## 2022-02-21 MED ORDER — HYDROMORPHONE HCL 1 MG/ML IJ SOLN
1.0000 mg | INTRAMUSCULAR | Status: DC | PRN
  Administered 2022-02-21 (×2): 1 mg via INTRAVENOUS
  Filled 2022-02-21 (×2): qty 1

## 2022-02-21 MED ORDER — HYDROMORPHONE 1 MG/ML IV SOLN
1.0000 mg | INTRAVENOUS | 0 refills | Status: DC
Start: 2022-02-21 — End: 2022-11-18

## 2022-02-21 MED ORDER — DIPHENHYDRAMINE HCL 50 MG/ML IJ SOLN: 12.5000 mg | Freq: Four times a day (QID) | INTRAMUSCULAR | Status: DC | PRN

## 2022-02-21 MED ORDER — HEPARIN (PORCINE) 25000 UT/250ML-% IV SOLN
800.0000 [IU]/h | INTRAVENOUS | Status: DC
  Administered 2022-02-21 (×2): 800 [IU]/h via INTRAVENOUS
  Filled 2022-02-21 (×3): qty 250

## 2022-02-21 MED ORDER — HEPARIN (PORCINE) 25000 UT/250ML-% IV SOLN: 800.0000 [IU]/h | INTRAVENOUS | Status: DC

## 2022-02-21 MED ORDER — MORPHINE SULFATE ER 60 MG PO TBCR: 60.0000 mg | EXTENDED_RELEASE_TABLET | Freq: Three times a day (TID) | ORAL | 0 refills | Status: DC

## 2022-02-21 MED ORDER — MORPHINE SULFATE ER 15 MG PO TBCR: 30.0000 mg | EXTENDED_RELEASE_TABLET | Freq: Three times a day (TID) | ORAL | Status: DC

## 2022-02-21 MED ORDER — ONDANSETRON HCL 4 MG/2ML IJ SOLN
4.0000 mg | Freq: Four times a day (QID) | INTRAMUSCULAR | Status: DC | PRN
Start: 2022-02-21 — End: 2022-02-22

## 2022-02-21 MED ORDER — ONDANSETRON HCL 4 MG/2ML IJ SOLN: 4.0000 mg | Freq: Four times a day (QID) | INTRAMUSCULAR | Status: DC | PRN

## 2022-02-21 MED ORDER — LORAZEPAM 2 MG/ML IJ SOLN
0.5000 mg | Freq: Once | INTRAMUSCULAR | Status: AC
  Administered 2022-02-21: 0.5 mg via INTRAVENOUS
  Filled 2022-02-21: qty 1

## 2022-02-21 MED ORDER — DIAZEPAM 5 MG PO TABS
5.0000 mg | ORAL_TABLET | Freq: Three times a day (TID) | ORAL | Status: DC | PRN
Start: 2022-02-21 — End: 2022-02-22

## 2022-02-21 MED ORDER — PANCRELIPASE (LIP-PROT-AMYL) 12000-38000 UNITS PO CPEP
12000.0000 [IU] | ORAL_CAPSULE | Freq: Three times a day (TID) | ORAL | Status: DC
  Filled 2022-02-21: qty 1

## 2022-02-21 MED ORDER — MORPHINE SULFATE ER 15 MG PO TBCR: 60.0000 mg | EXTENDED_RELEASE_TABLET | Freq: Three times a day (TID) | ORAL | Status: DC

## 2022-02-21 NOTE — Discharge Summary (Signed)
Physician Discharge Summary  Cynthia [name] MRN:[id number] DOB: 10/19/1980 DOA: 02/20/2022  PCP: [facility]  Admit date: 02/20/2022 Discharge date: 02/21/2022 Discharging to: [facility]  IDischarge Diagnoses:   Principal Problem:   Portal vein thrombosis Active Problems:   History of pulmonary embolism   Pancreatic adenocarcinoma (HCC)   Biliary obstruction   Cancer related pain     Hospital Course:  admitted this patient a couple of hours ago as I was told there would not be a bed a [facility] until tomorrow. After arriving up to the unit, the staff received a call that a bed was available at [facility].  I have re-evaluate her and she is no longer in severe pain. Please see my H and P.              Discharge Instructions  Discharge Instructions     Increase activity slowly   Complete by: As directed       Allergies as of 02/21/2022   No Known Allergies      Medication List     STOP taking these medications    dexamethasone 4 MG tablet Commonly known as: DECADRON   dicyclomine 20 MG tablet Commonly known as: BENTYL   enoxaparin 80 MG/0.8ML injection Commonly known as: LOVENOX   feeding supplement Liqd   lidocaine-prilocaine cream Commonly known as: EMLA   lipase/protease/amylase 12000-38000 units Cpep capsule Commonly known as: CREON   naloxone 4 MG/0.1ML Liqd nasal spray kit Commonly known as: NARCAN Replaced by: naloxone 0.4 MG/ML injection   ondansetron 8 MG disintegrating tablet Commonly known as: ZOFRAN-ODT   pantoprazole 20 MG tablet Commonly known as: PROTONIX   pantoprazole 40 MG tablet Commonly known as: PROTONIX   polyethylene glycol 17 g packet Commonly known as: MIRALAX / GLYCOLAX   prochlorperazine 10 MG tablet Commonly known as: COMPAZINE   senna-docusate 8.6-50 MG tablet Commonly known as: Senokot-S   Vitamin D (Ergocalciferol) 1.25 MG (50000 UNIT) Caps capsule Commonly known as: DRISDOL       TAKE these  medications    celecoxib 200 MG capsule Commonly known as: CELEBREX Take 200 mg by mouth 2 (two) times daily.   diazepam 5 MG tablet Commonly known as: Valium Take 1 tablet (5 mg total) by mouth every 8 (eight) hours as needed for muscle spasms.   heparin 25000 UT/250ML infusion Inject 800 Units/hr into the vein continuous.   HYDROmorphone 1 mg/mL injection Commonly known as: DILAUDID Inject 1 mL (1 mg total) into the vein every 4 (four) hours.   morphine 60 MG 12 hr tablet Commonly known as: MS CONTIN Take 1 tablet (60 mg total) by mouth every 8 (eight) hours. What changed:  medication strength how much to take Another medication with the same name was removed. Continue taking this medication, and follow the directions you see here.   naloxone 0.4 MG/ML injection Commonly known as: NARCAN Inject 1 mL (0.4 mg total) into the vein as needed (respiratory rate < 10). Replaces: naloxone 4 MG/0.1ML Liqd nasal spray kit            The results of significant diagnostics from this hospitalization (including imaging, microbiology, ancillary and laboratory) are listed below for reference.    CT ABDOMEN PELVIS W CONTRAST  Result Date: 02/21/2022 CLINICAL DATA:  Acute abdominal pain.  Metastatic pancreatic cancer. EXAM: CT ABDOMEN AND PELVIS WITH CONTRAST TECHNIQUE: Multidetector CT imaging of the abdomen and pelvis was performed using the standard protocol following bolus administration of intravenous contrast.   RADIATION DOSE REDUCTION: This exam was performed according to the departmental dose-optimization program which includes automated exposure control, adjustment of the mA and/or kV according to patient size and/or use of iterative reconstruction technique. CONTRAST:  80mL OMNIPAQUE IOHEXOL 300 MG/ML  SOLN COMPARISON:  CT abdomen pelvis dated 01/18/2022. FINDINGS: Lower chest: The visualized lung bases are clear. Partially visualized central venous line with tip in the region of the  cavoatrial junction. No intra-abdominal free air. Moderate ascites, increased since the prior CT. Hepatobiliary: Multiple hypoenhancing liver lesions consistent with metastatic disease. Overall interval increase in the size of the liver metastasis compared to prior CT. A 17 mm lesion in the left lobe of the liver previously measured approximately 10 mm. There is biliary ductal dilatation and pneumobilia. A common bile duct stent is again noted. Pancreas: Hypoenhancing mass in the region of the head of the pancreas measures approximately 3.4 x 4.0 cm, not significantly changed since the prior CT, measured in similar fashion. There is atrophy of body of the pancreas with dilatation of the main pancreatic duct. Spleen: Normal in size without focal abnormality. Adrenals/Urinary Tract: The adrenal glands are unremarkable. The kidneys appear unremarkable. The urinary bladder is minimally distended and grossly unremarkable. Stomach/Bowel: There has been interval placement of a duodenal stent. No evidence of bowel obstruction. The appendix is not visualized. Tiny pockets of air in the right lower quadrant, likely within the appendix. Vascular/Lymphatic: Mild atherosclerotic calcification the abdominal aorta. The IVC is grossly unremarkable. The main portal vein is not identified on today's exam and appears thrombosed. There is thrombus in the SMV extending to the porta splenic confluence. No obvious adenopathy. Reproductive: The uterus and ovaries are grossly unremarkable. Other: Diffuse subcutaneous edema and anasarca. Loss of subcutaneous fat and cachexia. Musculoskeletal: No acute osseous pathology. IMPRESSION: 1. Overall progression of metastatic disease with increased size of the liver lesions and ascites. 2. Thrombosed main portal vein and proximal SMV. These findings are new since the prior CT. 3. Interval placement of a duodenal stent. No evidence of bowel obstruction. 4. Diffuse subcutaneous edema and anasarca. 5.  Aortic Atherosclerosis (ICD10-I70.0). These results were called by telephone at the time of interpretation on 02/21/2022 at 2:49 am to provider MICHAEL BERO , who verbally acknowledged these results. Electronically Signed   By: Arash  Radparvar M.D.   On: 02/21/2022 02:52   Labs:   Basic Metabolic Panel: Recent Labs  Lab 02/21/22 0028  NA 138  K 3.5  CL 108  CO2 27  GLUCOSE 103*  BUN 7  CREATININE 0.35*  CALCIUM 8.1*     CBC: Recent Labs  Lab 02/21/22 0028 02/21/22 1653  WBC 4.4 4.9  HGB 10.4* 9.8*  HCT 30.8* 29.3*  MCV 87.0 88.5  PLT 238 240         SIGNED:   Saima Rizwan, MD  [facility] 02/21/2022, 6:43 PM   

## 2022-02-21 NOTE — Progress Notes (Signed)
Md order given to transport pt to [facility] with [facility]. Order to placed in EMR.

## 2022-02-21 NOTE — Progress Notes (Signed)
ANTICOAGULATION CONSULT NOTE - Follow Up Consult  Pharmacy Consult for Heparin Indication:  new portal vein thrombosis  No Known Allergies  Patient Measurements: Height: '5\' 5"'$  (165.1 cm) Weight: 44.5 kg (98 lb) IBW/kg (Calculated) : 57 Heparin Dosing Weight:  44.5 kg   Vital Signs: Temp: 98.3 F (36.8 C) (06/10 1651) Temp Source: Oral (06/10 1651) BP: 135/84 (06/10 1651) Pulse Rate: 56 (06/10 1651)  Labs: Recent Labs    02/21/22 0028 02/21/22 1020 02/21/22 1653  HGB 10.4*  --  9.8*  HCT 30.8*  --  29.3*  PLT 238  --  240  LABPROT 14.9  --   --   INR 1.2  --   --   HEPARINUNFRC  --  0.38 0.32  CREATININE 0.35*  --   --      Estimated Creatinine Clearance: 67 mL/min (A) (by C-G formula based on SCr of 0.35 mg/dL (L)).   Assessment:  AC/Heme: Portal vein thrombosis with h/o PE and CVA.  Hgb low 10.4, Plts WNL. Hep level 0.38 in goal. Repeat level 0.32 still in goal.   Goal of Therapy:  Heparin level 0.3-0.7 units/ml Monitor platelets by anticoagulation protocol: Yes   Plan:  Cont IV heparin 800 units/hr Daily HL and CBC F/u transition to [place]. Alford Highland, PharmD, BCPS Clinical Staff Pharmacist [email or web address]  Alford Highland, [employer] 02/21/2022,5:39 PM

## 2022-02-21 NOTE — Progress Notes (Signed)
Pt placed on transport [facility] for [facility]. Rn was told both services were busy and may not transfer until tomorrow. Rn called the admitting floor at [facility] and they assured RN that the pt would still have a bed with transport delay.

## 2022-02-21 NOTE — Progress Notes (Addendum)
ANTICOAGULATION CONSULT NOTE - Initial Consult  Pharmacy Consult for Heparin (on Lovenox PTA) Indication: new portal vein thrombosis; h/o PE and CVA  No Known Allergies  Patient Measurements: Height: '5\' 5"'$  (165.1 cm) Weight: 44.5 kg (98 lb) IBW/kg (Calculated) : 57 Heparin Dosing Weight: actual body weight  Vital Signs: Temp: 97.7 F (36.5 C) (06/09 2310) Temp Source: Oral (06/09 2310) BP: 132/94 (06/10 0200) Pulse Rate: 76 (06/10 0200)  Labs: Recent Labs    02/21/22 0028  HGB 10.4*  HCT 30.8*  PLT 238  LABPROT 14.9  INR 1.2  CREATININE 0.35*    Estimated Creatinine Clearance: 67 mL/min (A) (by C-G formula based on SCr of 0.35 mg/dL (L)).   Medical History: Past Medical History:  Diagnosis Date   CVA (cerebral vascular accident) ([place]) 2012   s/p c-section   History of pulmonary embolism    Normal pregnancy in multigravida in third trimester 07/18/2018   Pancreatic cancer ([place])    Pancreatitis, acute 12/04/2020   Tobacco abuse     Medications:  PTA Enoxaparin '80mg'$  sq q24h - LD 6/9 @ 0830  Assessment: 41 yr female with complaint of abdominal and back pain PMH significant for PE, CVA, pancreatic cancer with recent stent placement in common bile duct. CTAngio shows portal vein thrombosis, which is new since prior CT (01/18/2022)  Spoke with Dr Sedonia Small who requested heparin infusion begin now, as patient developed this new thrombus while on Lovenox therapy  Goal of Therapy:  Heparin level 0.3-0.7 units/ml Monitor platelets by anticoagulation protocol: Yes   Plan:  No heparin bolus due to timing of last enoxaparin dose Begin IV heparin gtt @ 800 units/hr Check heparin level 6 hr after heparin gtt started Daily heparin level & CBC Monitor for signs & symptoms of bleeding  Mayreli Alden, Toribio Harbour, PharmD 02/21/2022,3:13 AM

## 2022-02-21 NOTE — ED Provider Notes (Signed)
No available beds at [facility].   Hospitalist service aware of case and will evaluate for admission.   Valarie Merino, MD 02/21/22 [phone]

## 2022-02-21 NOTE — ED Provider Notes (Signed)
7:47 AM Care assumed from Dr. Sedonia Small.  At time of transfer of care, patient is awaiting for transport to [facility] for admission for further management of portal vein thrombus.  1:21 PM Nursing just got off the phone with [facility] and they report that they are holding her on 20 patients in the emergency department and they want her going to oncology floor.  They say that the patient will likely not get a bed today and may not even get a bed tomorrow at [facility].  With this new information, do not feel she is appropriate to stay in the emergency department for several days waiting for bed.  We will call for admission to medicine service for further management until she potentially can be transferred laterally to [facility].  Patient still complaining of severe pain and is requesting more pain medicine.  Will increase her Dilaudid dose.  She is still on heparin drip.    Clinical Impression: 1. Portal vein thrombosis     Disposition: Admit  This note was prepared with assistance of Dragon voice recognition software. Occasional wrong-word or sound-a-like substitutions may have occurred due to the inherent limitations of voice recognition software.     Ishani Goldwasser, Gwenyth Allegra, MD 02/22/22 [phone]

## 2022-02-21 NOTE — Progress Notes (Signed)
ANTICOAGULATION CONSULT NOTE - Follow Up Consult  Pharmacy Consult for Heparin Indication:  new portal vein thrombosis  No Known Allergies  Patient Measurements: Height: '5\' 5"'$  (165.1 cm) Weight: 44.5 kg (98 lb) IBW/kg (Calculated) : 57 Heparin Dosing Weight:  44.5 kg   Vital Signs: BP: 118/86 (06/10 1100) Pulse Rate: 63 (06/10 1100)  Labs: Recent Labs    02/21/22 0028 02/21/22 1020  HGB 10.4*  --   HCT 30.8*  --   PLT 238  --   LABPROT 14.9  --   INR 1.2  --   HEPARINUNFRC  --  0.38  CREATININE 0.35*  --     Estimated Creatinine Clearance: 67 mL/min (A) (by C-G formula based on SCr of 0.35 mg/dL (L)).   Assessment: AC/Heme: Portal vein thrombosis with h/o PE and CVA.  Hgb low 10.4, Plts WNL. Hep level 0.38 in goal.  Goal of Therapy:  Heparin level 0.3-0.7 units/ml Monitor platelets by anticoagulation protocol: Yes   Plan:  IV heparin 800 units/hr Confirm level in 6 hrs. Daily HL and CBC F/u transition to [place]. Alford Highland, PharmD, BCPS Clinical Staff Pharmacist [email or web address]  Alford Highland, [employer] 02/21/2022,11:18 AM

## 2022-02-21 NOTE — ED Notes (Signed)
[facility] [phone]

## 2022-02-21 NOTE — H&P (Signed)
History and Physical    Cynthia [name]  IRC:[id number]  DOB: [birth year]  DOA: 02/20/2022 PCP: Clinic, [facility]   Patient coming from: home  Chief Complaint: back and abdominal pain  HPI: Cynthia [name] is a 41 y.o. female with medical history of pancreatic adenocarcinoma with biliary duct obstruction and stent, PEs on Lovenox and recurrent pancreatitis who is a smoker. She gets her care at [facility].  She is prescribed MS contin and MS IR at home for chronic abdominal pain.  She presents for increasing abdominal pain. Her doctor increased MS contin from 45 to 60 mg TID yesterday but she was not able to pick it up. She states her pain is in her back and throughout her abdomen. She has been taking her prescribed Lovenox.  It is difficult to obtain a full history due to pain.   ED Course:  - CTAP reveals new portal venous thrombosis - Heparin infusion started - Dilaudid, close to 11 mg and one time Zofran given  ED called [facility] to transfer patient as she get her care there - she has been accepted by Dr Reynaldo Minium however, no beds are available today.  Review of Systems:  All other systems reviewed and apart from HPI, are negative.  Past Medical History:  Diagnosis Date   CVA (cerebral vascular accident) ([place]) 2012   s/p c-section   History of pulmonary embolism    Normal pregnancy in multigravida in third trimester 07/18/2018   Pancreatic cancer ([place])    Pancreatitis, acute 12/04/2020   Tobacco abuse     Past Surgical History:  Procedure Laterality Date   BIOPSY  12/02/2020   Procedure: BIOPSY;  Surgeon: Rush Landmark Telford Nab., MD;  Location: [place];  Service: Gastroenterology;;   [place] N/A 02/27/2015   Procedure: CESAREAN SECTION;  Surgeon: Newton Pigg, MD;  Location: [place] [facility];  Service: Obstetrics;  Laterality: N/A;   CESAREAN SECTION N/A 08/03/2018   Procedure: REPEAT CESAREAN SECTION;  Surgeon: Janyth Contes, MD;   Location: [place];  Service: Obstetrics;  Laterality: N/ANira Conn, RNFA   ERCP N/A 12/02/2020   Procedure: ENDOSCOPIC RETROGRADE CHOLANGIOPANCREATOGRAPHY (ERCP);  Surgeon: Irving Copas., MD;  Location: [place];  Service: Gastroenterology;  Laterality: N/A;   ESOPHAGOGASTRODUODENOSCOPY N/A 12/02/2020   Procedure: ESOPHAGOGASTRODUODENOSCOPY (EGD);  Surgeon: Irving Copas., MD;  Location: [place];  Service: Gastroenterology;  Laterality: N/A;   FINE NEEDLE ASPIRATION  12/02/2020   Procedure: FINE NEEDLE ASPIRATION (FNA) LINEAR;  Surgeon: Irving Copas., MD;  Location: [place];  Service: Gastroenterology;;   UPPER ESOPHAGEAL ENDOSCOPIC ULTRASOUND (EUS) N/A 12/02/2020   Procedure: UPPER ESOPHAGEAL ENDOSCOPIC ULTRASOUND (EUS);  Surgeon: Irving Copas., MD;  Location: [place];  Service: Gastroenterology;  Laterality: N/A;    Social History:  Unable to obtain  No Known Allergies  Family History  Problem Relation Age of Onset   Hypertension Mother    Hypertension Father    Gastric cancer Maternal Grandmother        dx unknown age   Lung cancer Paternal Grandfather        dx after 38; smoking hx     Prior to Admission medications   Medication Sig Start Date End Date Taking? Authorizing Provider  celecoxib (CELEBREX) 200 MG capsule Take 200 mg by mouth 2 (two) times daily. 01/03/22  Yes [provider]  dexamethasone (DECADRON) 4 MG tablet Take 2 tablets (8 mg total) by mouth in the  morning for 2 days after the first day of each cycle, then as directed. 01/20/22  Yes [provider]  diazepam (VALIUM) 5 MG tablet Take 1 tablet (5 mg total) by mouth every 8 (eight) hours as needed for muscle spasms. 11/26/21 11/26/22 Yes Florencia Reasons, MD  enoxaparin (LOVENOX) 80 MG/0.8ML injection Inject 80 mg into the skin daily. 05/28/21  Yes [provider]  feeding supplement (ENSURE ENLIVE / ENSURE PLUS) LIQD Take 237 mLs by  mouth 2 (two) times daily between meals. 11/26/21  Yes Florencia Reasons, MD  lidocaine-prilocaine (EMLA) cream Apply 1 application topically as needed (for port access). 05/23/21  Yes [provider]  lipase/protease/amylase (CREON) 12000-38000 units CPEP capsule Take 1 capsule (12,000 Units total) by mouth 3 (three) times daily with meals. 11/26/21  Yes Florencia Reasons, MD  morphine (MS CONTIN) 30 MG 12 hr tablet Take 30 mg by mouth every 8 (eight) hours. 01/21/22  Yes [provider]  morphine (MSIR) 15 MG tablet Take 22.5 mg by mouth every 4 (four) hours as needed for severe pain. 02/20/22 03/12/22 Yes [provider]  Morphine Sulfate ER 15 MG T12A Take 15 mg by mouth every 12 (twelve) hours. 01/18/22  Yes Azucena Cecil, PA-C  naloxone [facility]) nasal spray 4 mg/0.1 mL Place 1 spray into the nose once as needed (for opiod overdose). 11/26/21  Yes [provider]  ondansetron (ZOFRAN-ODT) 8 MG disintegrating tablet Take 8 mg by mouth every 8 (eight) hours as needed for nausea. 07/16/21  Yes [provider]  pantoprazole (PROTONIX) 20 MG tablet Take 1 tablet (20 mg total) by mouth daily. 12/31/21  Yes Tacy Learn, PA-C  pantoprazole (PROTONIX) 40 MG tablet Take 1 tablet (40 mg total) by mouth 2 (two) times daily. 11/26/21  Yes Florencia Reasons, MD  dicyclomine (BENTYL) 20 MG tablet Take 1 tablet (20 mg total) by mouth 2 (two) times daily as needed for up to 7 days for spasms. Patient not taking: Reported on 02/21/2022 11/26/21 12/03/21  Florencia Reasons, MD  polyethylene glycol (MIRALAX / GLYCOLAX) 17 g packet Take 17 g by mouth 2 (two) times daily. Patient not taking: Reported on 02/21/2022 11/26/21   Florencia Reasons, MD  prochlorperazine (COMPAZINE) 10 MG tablet Take 10 mg by mouth every 6 (six) hours as needed for nausea. Patient not taking: Reported on 02/21/2022 07/17/21   [provider]  senna-docusate (SENOKOT-S) 8.6-50 MG tablet Take 1 tablet by mouth 2 (two) times daily. Patient not  taking: Reported on 02/21/2022 11/26/21   Florencia Reasons, MD  Vitamin D, Ergocalciferol, (DRISDOL) 1.25 MG (50000 UNIT) CAPS capsule Take 1 capsule (50,000 Units total) by mouth every 7 (seven) days. Patient not taking: Reported on 02/21/2022 11/28/21   Florencia Reasons, MD    Physical Exam: Wt Readings from Last 3 Encounters:  02/20/22 44.5 kg  01/18/22 44.5 kg  12/18/21 45.8 kg   Vitals:   02/21/22 1400 02/21/22 1430 02/21/22 1500 02/21/22 1530  BP: 117/81 132/75 127/86 (!) 120/93  Pulse: (!) 58 (!) 57 71 79  Resp: '14 15 14 13  '$ Temp:      TempSrc:      SpO2: 100% 100% 100% 99%  Weight:      Height:          Constitutional:  very uncomfortable - gettting in an out of bed- sitting on the floor Eyes:   lids and conjunctivae normal ENT:  Mucous membranes are  dry Respiratory:  Clear to  auscultation bilaterally  Normal respiratory effort.  Cardiovascular:  S1 & S2 heard, regular rate and rhythm No Murmurs Abdomen:  Dstended Tender throughout the abdomen to light touch Bowel sounds normal Extremities:  No clubbing / cyanosis No pedal edema  Skin:  No rashes, lesions or ulcers Neurologic:  AAO x 3 CN 2-12 grossly intact, moving all extremities Psychiatric:  Very anxious MSK- tender in mid and lower back bilaterally    Labs on Admission: I have personally reviewed following labs and imaging studies  CBC: Recent Labs  Lab 02/21/22 0028  WBC 4.4  HGB 10.4*  HCT 30.8*  MCV 87.0  PLT 267   Basic Metabolic Panel: Recent Labs  Lab 02/21/22 0028  NA 138  K 3.5  CL 108  CO2 27  GLUCOSE 103*  BUN 7  CREATININE 0.35*  CALCIUM 8.1*   GFR:  Liver Function Tests: Recent Labs  Lab 02/21/22 0028  AST 26  ALT 23  ALKPHOS 195*  BILITOT 0.7  PROT 5.0*  ALBUMIN 2.5*   Recent Labs  Lab 02/21/22 0028  LIPASE 17    Coagulation Profile: Recent Labs  Lab 02/21/22 0028  INR 1.2     Radiological Exams on Admission: CT ABDOMEN PELVIS W CONTRAST  Result Date:  02/21/2022 CLINICAL DATA:  Acute abdominal pain.  Metastatic pancreatic cancer. EXAM: CT ABDOMEN AND PELVIS WITH CONTRAST TECHNIQUE: Multidetector CT imaging of the abdomen and pelvis was performed using the standard protocol following bolus administration of intravenous contrast. RADIATION DOSE REDUCTION: This exam was performed according to the departmental dose-optimization program which includes automated exposure control, adjustment of the mA and/or kV according to patient size and/or use of iterative reconstruction technique. CONTRAST:  9m OMNIPAQUE IOHEXOL 300 MG/ML  SOLN COMPARISON:  CT abdomen pelvis dated 01/18/2022. FINDINGS: Lower chest: The visualized lung bases are clear. Partially visualized central venous line with tip in the region of the cavoatrial junction. No intra-abdominal free air. Moderate ascites, increased since the prior CT. Hepatobiliary: Multiple hypoenhancing liver lesions consistent with metastatic disease. Overall interval increase in the size of the liver metastasis compared to prior CT. A 17 mm lesion in the left lobe of the liver previously measured approximately 10 mm. There is biliary ductal dilatation and pneumobilia. A common bile duct stent is again noted. Pancreas: Hypoenhancing mass in the region of the head of the pancreas measures approximately 3.4 x 4.0 cm, not significantly changed since the prior CT, measured in similar fashion. There is atrophy of body of the pancreas with dilatation of the main pancreatic duct. Spleen: Normal in size without focal abnormality. Adrenals/Urinary Tract: The adrenal glands are unremarkable. The kidneys appear unremarkable. The urinary bladder is minimally distended and grossly unremarkable. Stomach/Bowel: There has been interval placement of a duodenal stent. No evidence of bowel obstruction. The appendix is not visualized. Tiny pockets of air in the right lower quadrant, likely within the appendix. Vascular/Lymphatic: Mild  atherosclerotic calcification the abdominal aorta. The IVC is grossly unremarkable. The main portal vein is not identified on today's exam and appears thrombosed. There is thrombus in the SMV extending to the porta splenic confluence. No obvious adenopathy. Reproductive: The uterus and ovaries are grossly unremarkable. Other: Diffuse subcutaneous edema and anasarca. Loss of subcutaneous fat and cachexia. Musculoskeletal: No acute osseous pathology. IMPRESSION: 1. Overall progression of metastatic disease with increased size of the liver lesions and ascites. 2. Thrombosed main portal vein and proximal SMV. These findings are new since the prior CT. 3. Interval  placement of a duodenal stent. No evidence of bowel obstruction. 4. Diffuse subcutaneous edema and anasarca. 5. Aortic Atherosclerosis (ICD10-I70.0). These results were called by telephone at the time of interpretation on 02/21/2022 at 2:49 am to provider [facility] , who verbally acknowledged these results. Electronically Signed   By: Anner Crete M.D.   On: 02/21/2022 02:52    EKG: Independently reviewed. SR at 63 bpm  Assessment/Plan Principal Problem: Portal vein and SMA thrombosis   History of pulmonary embolism - on Lovenox already for PEs which she states she was taking appropriately- now on Heparin infusion Active Problems:    Pancreatic adenocarcinoma (HCC)   Biliary obstruction   Cancer related pain - resume home MS Contin at 60 mg TID per home dose - will start a Dilaudid infusion after giving 3 mg IV Dilaudid-   - she states she also takes Valium PRN, Advil and Celebrex- will resume her Celebrex and PRN Valium - give one time 0.5 IV Ativan for her anxiety - cont Creon - will request palliative care consult for pain management  Anasarca - diffuse subcutaneous edema and anasarca noted on CTAP   DVT prophylaxis: Heparin infusion  Code Status: full code  Consults called: palliative care  Admission status:  Level of  care: Med-Surg  Debbe Odea MD [facility]    02/21/2022, 4:48 PM

## 2022-02-21 NOTE — Progress Notes (Signed)
Pt stable on arrival to floor though was very difficult to get comfortable due to pain. Family at bedside. Assessment and Vs performed.

## 2022-02-21 NOTE — Progress Notes (Signed)
Report called to Olena Heckle RN (RN at [facility]), without complication. Pt remains stable with family at bedside. Rn will Continue to monitor.

## 2022-02-21 NOTE — ED Provider Notes (Signed)
[facility] Emergency Department Provider Note MRN:  [id number]  Arrival date & time: 02/21/22     Chief Complaint   Abdominal Pain and Back Pain   History of Present Illness   CHANIECE BARBATO is a 41 y.o. year-old female with a history of pancreatic cancer, stroke presenting to the ED with chief complaint of abdominal pain and back pain.  Worsening abdominal pain over the past 3 weeks.  Had a recent stent placed in her common bile duct.  Has pancreatic cancer.  Denies fever.  Pain is unbearable this evening, severe.  Review of Systems  A thorough review of systems was obtained and all systems are negative except as noted in the HPI and PMH.   Patient's Health History    Past Medical History:  Diagnosis Date   CVA (cerebral vascular accident) ([place]) 2012   s/p c-section   History of pulmonary embolism    Normal pregnancy in multigravida in third trimester 07/18/2018   Pancreatic cancer ([place])    Pancreatitis, acute 12/04/2020   Tobacco abuse     Past Surgical History:  Procedure Laterality Date   BIOPSY  12/02/2020   Procedure: BIOPSY;  Surgeon: Rush Landmark Telford Nab., MD;  Location: [place];  Service: Gastroenterology;;   CESAREAN SECTION     CESAREAN SECTION N/A 02/27/2015   Procedure: CESAREAN SECTION;  Surgeon: Newton Pigg, MD;  Location: [place] [facility];  Service: Obstetrics;  Laterality: N/A;   CESAREAN SECTION N/A 08/03/2018   Procedure: REPEAT CESAREAN SECTION;  Surgeon: Janyth Contes, MD;  Location: [place];  Service: Obstetrics;  Laterality: N/ANira Conn, RNFA   ERCP N/A 12/02/2020   Procedure: ENDOSCOPIC RETROGRADE CHOLANGIOPANCREATOGRAPHY (ERCP);  Surgeon: Irving Copas., MD;  Location: [place];  Service: Gastroenterology;  Laterality: N/A;   ESOPHAGOGASTRODUODENOSCOPY N/A 12/02/2020   Procedure: ESOPHAGOGASTRODUODENOSCOPY (EGD);  Surgeon: Irving Copas., MD;  Location: [place];  Service:  Gastroenterology;  Laterality: N/A;   FINE NEEDLE ASPIRATION  12/02/2020   Procedure: FINE NEEDLE ASPIRATION (FNA) LINEAR;  Surgeon: Irving Copas., MD;  Location: [place];  Service: Gastroenterology;;   UPPER ESOPHAGEAL ENDOSCOPIC ULTRASOUND (EUS) N/A 12/02/2020   Procedure: UPPER ESOPHAGEAL ENDOSCOPIC ULTRASOUND (EUS);  Surgeon: Irving Copas., MD;  Location: [place];  Service: Gastroenterology;  Laterality: N/A;    Family History  Problem Relation Age of Onset   Hypertension Mother    Hypertension Father    Gastric cancer Maternal Grandmother        dx unknown age   Lung cancer Paternal Grandfather        dx after 54; smoking hx    Social History   Socioeconomic History   Marital status: Single    Spouse name: Not on file   Number of children: 9   Years of education: Not on file   Highest education level: Not on file  Occupational History   Occupation: landscaping   Tobacco Use   Smoking status: Every Day    Packs/day: 0.50    Years: 13.00    Total pack years: 6.50    Types: Cigarettes   Smokeless tobacco: Never  Vaping Use   Vaping Use: Never used  Substance and Sexual Activity   Alcohol use: No    Alcohol/week: 0.0 standard drinks of alcohol   Drug use: No   Sexual activity: Yes  Other Topics Concern   Not on file  Social History Narrative   Not on file   Social Determinants of  Health   Financial Resource Strain: Not on file  Food Insecurity: Not on file  Transportation Needs: Not on file  Physical Activity: Not on file  Stress: Not on file  Social Connections: Not on file  Intimate Partner Violence: Not on file     Physical Exam   Vitals:   02/21/22 0200 02/21/22 0330  BP: (!) 132/94 100/72  Pulse: 76 64  Resp: 14 14  Temp:    SpO2: 100% 100%    CONSTITUTIONAL: Chronically ill-appearing, NAD NEURO/PSYCH:  Alert and oriented x 3, no focal deficits EYES:  eyes equal and reactive ENT/NECK:  no LAD, no JVD CARDIO:  Regular rate, well-perfused, normal S1 and S2 PULM:  CTAB no wheezing or rhonchi GI/GU:  non-distended, moderate upper abdominal tenderness with rebound MSK/SPINE:  No gross deformities, no edema SKIN:  no rash, atraumatic   *Additional and/or pertinent findings included in MDM below  Diagnostic and Interventional Summary    EKG Interpretation  Date/Time:  Saturday February 21 2022 00:35:50 EDT Ventricular Rate:  63 PR Interval:  141 QRS Duration: 82 QT Interval:  426 QTC Calculation: 437 R Axis:   70 Text Interpretation: Sinus rhythm Confirmed by Gerlene Fee [phone]) on 02/21/2022 5:03:03 AM       Labs Reviewed  CBC - Abnormal; Notable for the following components:      Result Value   RBC 3.54 (*)    Hemoglobin 10.4 (*)    HCT 30.8 (*)    RDW 17.2 (*)    All other components within normal limits  COMPREHENSIVE METABOLIC PANEL - Abnormal; Notable for the following components:   Glucose, Bld 103 (*)    Creatinine, Ser 0.35 (*)    Calcium 8.1 (*)    Total Protein 5.0 (*)    Albumin 2.5 (*)    Alkaline Phosphatase 195 (*)    Anion gap 3 (*)    All other components within normal limits  URINALYSIS, ROUTINE W REFLEX MICROSCOPIC - Abnormal; Notable for the following components:   APPearance CLOUDY (*)    All other components within normal limits  I-STAT BETA HCG BLOOD, ED (MC, WL, AP ONLY) - Abnormal; Notable for the following components:   I-stat hCG, quantitative 31.1 (*)    All other components within normal limits  LIPASE, BLOOD  PROTIME-INR  PREGNANCY, URINE  CBC  HEPARIN LEVEL (UNFRACTIONATED)    CT ABDOMEN PELVIS W CONTRAST  Final Result      Medications  heparin ADULT infusion 100 units/mL (25000 units/220m) (800 Units/hr Intravenous New Bag/Given 02/21/22 0430)  HYDROmorphone (DILAUDID) injection 1 mg (1 mg Intravenous Given 02/21/22 0023)  ondansetron (ZOFRAN) injection 4 mg (4 mg Intravenous Given 02/21/22 0023)  sodium chloride 0.9 % bolus 1,000 mL (0 mLs  Intravenous Stopped 02/21/22 0332)  HYDROmorphone (DILAUDID) injection 1 mg (1 mg Intravenous Given 02/21/22 0125)  iohexol (OMNIPAQUE) 300 MG/ML solution 80 mL (80 mLs Intravenous Contrast Given 02/21/22 0226)  HYDROmorphone (DILAUDID) injection 1 mg (1 mg Intravenous Given 02/21/22 0423)     Procedures  /  Critical Care .Critical Care  Performed by: BMaudie Flakes MD Authorized by: BMaudie Flakes MD   Critical care provider statement:    Critical care time (minutes):  85   Critical care was necessary to treat or prevent imminent or life-threatening deterioration of the following conditions: Acute portal vein thrombosis.   Critical care was time spent personally by me on the following activities:  Development of treatment plan with  patient or surrogate, discussions with consultants, evaluation of patient's response to treatment, examination of patient, ordering and review of laboratory studies, ordering and review of radiographic studies, ordering and performing treatments and interventions, pulse oximetry, re-evaluation of patient's condition and review of old charts   ED Course and Medical Decision Making  Initial Impression and Ddx Concerning abdominal exam and a 41 year old with pancreatic cancer, recent CBD stent.  Differential diagnosis includes stent failure, biliary obstruction, perforated viscus, peritonitis.  Providing pain control, obtaining labs, CT imaging.  Past medical/surgical history that increases complexity of ED encounter: Metastatic pancreatic cancer  Interpretation of Diagnostics I personally reviewed the EKG and my interpretation is as follows: Sinus rhythm  Labs are overall reassuring with no significant blood count or electrolyte disturbance.  CT revealing worsening of metastatic disease, new portal vein thrombus  Patient Reassessment and Ultimate Disposition/Management     Patient is started on heparin given the new portal vein thrombus.  With additional  history we learned that patient was recently diagnosed with a PE and has been taking Lovenox daily.  She is clotting despite less Lovenox and so will continue heparin infusion.  Case discussed with Dr. Vernard Gambles of interventional radiology, heparin reasonable at this time, other consideration would include thrombolytics.  Discussion with patient regarding where to receive her care.  She follows at [facility] for oncology and gastroenterology.  Discussed with Dr. Bridgett Larsson of hospital service here, patient would likely get more cohesive care at [facility].  Patient agrees to be transferred to [facility], accepting physician at [facility] is Dr. Reynaldo Minium.  Patient management required discussion with the following services or consulting groups:  Hospitalist Service and Radiology/Interventional Radiology  Complexity of Problems Addressed Acute illness or injury that poses threat of life of bodily function  Additional Data Reviewed and Analyzed Further history obtained from: Further history from spouse/family member  Additional Factors Impacting ED Encounter Risk Use of parenteral controlled substances and Consideration of hospitalization  Barth Kirks. [place] Small, [place] [email or web address]  Final Clinical Impressions(s) / ED Diagnoses     ICD-10-CM   1. Portal vein thrombosis  [place]       ED Discharge Orders     None        Discharge Instructions Discussed with and Provided to Patient:   Discharge Instructions   None      Maudie Flakes, MD 02/21/22 [phone]

## 2022-02-21 NOTE — Plan of Care (Signed)
  Problem: Clinical Measurements: Goal: Will remain free from infection Outcome: Progressing   Problem: Clinical Measurements: Goal: Diagnostic test results will improve Outcome: Progressing   Problem: Clinical Measurements: Goal: Respiratory complications will improve Outcome: Progressing   Problem: Clinical Measurements: Goal: Cardiovascular complication will be avoided Outcome: Progressing   Problem: Elimination: Goal: Will not experience complications related to bowel motility Outcome: Progressing   Problem: Skin Integrity: Goal: Risk for impaired skin integrity will decrease Outcome: Progressing   Problem: Pain Managment: Goal: General experience of comfort will improve Outcome: Progressing

## 2022-02-22 NOTE — Progress Notes (Signed)
Patient transported by care link with PCA pump and syringe, Care link RN brought PCA pump with syring back to unit after patient successfully transported to [facility]. Remaining 68ms dilaudid wasted with MValetta Fuller RN.

## 2022-02-27 ENCOUNTER — Encounter (HOSPITAL_COMMUNITY): Payer: Self-pay

## 2022-02-27 ENCOUNTER — Other Ambulatory Visit: Payer: Self-pay

## 2022-02-27 ENCOUNTER — Emergency Department (HOSPITAL_COMMUNITY)
Admission: EM | Admit: 2022-02-27 | Discharge: 2022-02-27 | Disposition: A | Discharge status: Home / Self Care | Payer: Medicaid Other | Attending: Emergency Medicine | Admitting: Emergency Medicine

## 2022-02-27 ENCOUNTER — Emergency Department (HOSPITAL_COMMUNITY): Payer: Medicaid Other

## 2022-02-27 DIAGNOSIS — Z8673 Personal history of transient ischemic attack (TIA), and cerebral infarction without residual deficits: Secondary | ICD-10-CM | POA: Diagnosis not present

## 2022-02-27 DIAGNOSIS — Z7901 Long term (current) use of anticoagulants: Secondary | ICD-10-CM | POA: Diagnosis not present

## 2022-02-27 DIAGNOSIS — G893 Neoplasm related pain (acute) (chronic): Secondary | ICD-10-CM

## 2022-02-27 DIAGNOSIS — C7889 Secondary malignant neoplasm of other digestive organs: Secondary | ICD-10-CM | POA: Diagnosis not present

## 2022-02-27 DIAGNOSIS — R109 Unspecified abdominal pain: Secondary | ICD-10-CM | POA: Diagnosis present

## 2022-02-27 LAB — CBC WITH DIFFERENTIAL/PLATELET
Abs Immature Granulocytes: 0.02 10*3/uL (ref 0.00–0.07)
Basophils Absolute: 0 10*3/uL (ref 0.0–0.1)
Basophils Relative: 0 %
Eosinophils Absolute: 0 10*3/uL (ref 0.0–0.5)
Eosinophils Relative: 0 %
HCT: 30 % — ABNORMAL LOW (ref 36.0–46.0)
Hemoglobin: 10.1 g/dL — ABNORMAL LOW (ref 12.0–15.0)
Immature Granulocytes: 0 %
Lymphocytes Relative: 29 %
Lymphs Abs: 2.2 10*3/uL (ref 0.7–4.0)
MCH: 29.8 pg (ref 26.0–34.0)
MCHC: 33.7 g/dL (ref 30.0–36.0)
MCV: 88.5 fL (ref 80.0–100.0)
Monocytes Absolute: 0.6 10*3/uL (ref 0.1–1.0)
Monocytes Relative: 9 %
Neutro Abs: 4.6 10*3/uL (ref 1.7–7.7)
Neutrophils Relative %: 62 %
Platelets: 313 10*3/uL (ref 150–400)
RBC: 3.39 MIL/uL — ABNORMAL LOW (ref 3.87–5.11)
RDW: 18 % — ABNORMAL HIGH (ref 11.5–15.5)
WBC: 7.4 10*3/uL (ref 4.0–10.5)
nRBC: 0 % (ref 0.0–0.2)

## 2022-02-27 LAB — COMPREHENSIVE METABOLIC PANEL
ALT: 29 U/L (ref 0–44)
AST: 37 U/L (ref 15–41)
Albumin: 2.8 g/dL — ABNORMAL LOW (ref 3.5–5.0)
Alkaline Phosphatase: 176 U/L — ABNORMAL HIGH (ref 38–126)
Anion gap: 7 (ref 5–15)
BUN: 6 mg/dL (ref 6–20)
CO2: 26 mmol/L (ref 22–32)
Calcium: 8.4 mg/dL — ABNORMAL LOW (ref 8.9–10.3)
Chloride: 107 mmol/L (ref 98–111)
Creatinine, Ser: 0.48 mg/dL (ref 0.44–1.00)
GFR, Estimated: >60 mL/min (ref >60)
Glucose, Bld: 101 mg/dL — ABNORMAL HIGH (ref 70–99)
Potassium: 3.7 mmol/L (ref 3.5–5.1)
Sodium: 140 mmol/L (ref 135–145)
Total Bilirubin: 0.7 mg/dL (ref 0.3–1.2)
Total Protein: 5.6 g/dL — ABNORMAL LOW (ref 6.5–8.1)

## 2022-02-27 LAB — LIPASE, BLOOD: Lipase: 24 U/L (ref 11–51)

## 2022-02-27 LAB — LACTIC ACID, PLASMA
Lactic Acid, Venous: 2.2 mmol/L (ref 0.5–1.9)
Lactic Acid, Venous: 1.6 mmol/L (ref 0.5–1.9)

## 2022-02-27 MED ORDER — SODIUM CHLORIDE (PF) 0.9 % IJ SOLN
INTRAMUSCULAR | Status: AC
  Filled 2022-02-27: qty 50

## 2022-02-27 MED ORDER — MORPHINE SULFATE ER 60 MG PO TBCR: 60.0000 mg | EXTENDED_RELEASE_TABLET | Freq: Two times a day (BID) | ORAL | 0 refills | Status: DC

## 2022-02-27 MED ORDER — KETAMINE HCL 50 MG/5ML IJ SOSY
0.3000 mg/kg | PREFILLED_SYRINGE | Freq: Once | INTRAMUSCULAR | Status: AC
  Administered 2022-02-27: 13 mg via INTRAVENOUS
  Filled 2022-02-27: qty 5

## 2022-02-27 MED ORDER — OXYCODONE HCL 5 MG PO TABS
20.0000 mg | ORAL_TABLET | Freq: Once | ORAL | Status: AC
  Administered 2022-02-27: 20 mg via ORAL
  Filled 2022-02-27: qty 4

## 2022-02-27 MED ORDER — HYDROMORPHONE HCL 2 MG/ML IJ SOLN
2.0000 mg | Freq: Once | INTRAMUSCULAR | Status: AC
  Administered 2022-02-27: 2 mg via INTRAVENOUS
  Filled 2022-02-27: qty 1

## 2022-02-27 MED ORDER — SODIUM CHLORIDE 0.9 % IV BOLUS
1000.0000 mL | Freq: Once | INTRAVENOUS | Status: AC
  Administered 2022-02-27: 1000 mL via INTRAVENOUS

## 2022-02-27 MED ORDER — ONDANSETRON HCL 4 MG/2ML IJ SOLN
4.0000 mg | Freq: Once | INTRAMUSCULAR | Status: AC
  Administered 2022-02-27: 4 mg via INTRAVENOUS
  Filled 2022-02-27: qty 2

## 2022-02-27 MED ORDER — IOHEXOL 300 MG/ML  SOLN
100.0000 mL | Freq: Once | INTRAMUSCULAR | Status: AC | PRN
  Administered 2022-02-27: 75 mL via INTRAVENOUS

## 2022-02-27 NOTE — ED Notes (Signed)
  Was in the process of giving patient ordered ketamine 13 mg.  Medication was being given slowly through PIV with fluids running.  Had administered about 0.5 ml of prescribed 1.3 ml and patient started getting worked up.  Patient perched up on her knees and grabbed the bed rails stating that it made her feel weird and she felt like she was going to pass out.  Patient asked me to stop giving the medication and I had already disconnected the syringe from the IV tubing port.  HR went from 90s to 120s.  I sat with patient for several minutes and talked with her about the situation.  Patient stated she still felt weird and asked for the EDP to come back in.  Dr Dina Rich notified.  Patient vitals back to premedication range.  Remaining 0.8 ml of ketamine was wasted in pyxis with Burnis Medin RN.

## 2022-02-27 NOTE — ED Provider Notes (Signed)
[place] DEPT Provider Note   CSN: [id number] Arrival date & time: 02/27/22  0300     History {Add pertinent medical, surgical, social history, OB history to HPI:1} Chief Complaint  Patient presents with   Abdominal Pain    Cynthia [name] is a 41 y.o. female.  HPI     This is a 41 year old female with end-stage pancreatic cancer who presents with intractable abdominal pain.  Patient was just discharged from [facility] yesterday morning.  She was discharged with home hospice and a morphine PCA.  Based on discharge summary, no further oncologic or chemotherapy options are available for her cancer.  Patient states that her pain at discharge was 3.  It is currently 10.  She feels that it is stabbing and different than normal.  She has nausea without vomiting.  No diarrhea or constipation.  Denies fevers.  Chart reviewed.  Recent discharge from [facility].  Transition to home hospice with a morphine PCA.  However, goals of care are not clear.  Patient would like "all the work-up done."   Home Medications Prior to Admission medications   Medication Sig Start Date End Date Taking? Authorizing Provider  celecoxib (CELEBREX) 100 MG capsule Take 100 mg by mouth in the morning, at noon, and at bedtime. 02/26/22  Yes [provider]  dexamethasone (DECADRON) 4 MG tablet Take 4 mg by mouth daily. 02/26/22  Yes [provider]  ELIQUIS 5 MG TABS tablet Take 5 mg by mouth 2 (two) times daily. 02/26/22  Yes [provider]  naloxone (NARCAN) 0.4 MG/ML injection Inject 1 mL (0.4 mg total) into the vein as needed (respiratory rate < 10). 02/21/22  Yes Rizwan, Eunice Blase, MD  ondansetron (ZOFRAN) 8 MG tablet Take 8 mg by mouth 3 (three) times daily. 02/26/22  Yes [provider]  diazepam (VALIUM) 5 MG tablet Take 1 tablet (5 mg total) by mouth every 8 (eight) hours as needed for muscle spasms. Patient not taking: Reported on  02/27/2022 11/26/21 11/26/22  Florencia Reasons, MD  heparin 25000 UT/250ML infusion Inject 800 Units/hr into the vein continuous. Patient not taking: Reported on 02/27/2022 02/21/22   Debbe Odea, MD  HYDROmorphone (DILAUDID) 1 mg/mL injection Inject 1 mL (1 mg total) into the vein every 4 (four) hours. Patient not taking: Reported on 02/27/2022 02/21/22   Debbe Odea, MD  morphine (MS CONTIN) 60 MG 12 hr tablet Take 1 tablet (60 mg total) by mouth every 8 (eight) hours. 02/21/22   Debbe Odea, MD      Allergies    Patient has no known allergies.    Review of Systems   Review of Systems  Constitutional:  Negative for fever.  Respiratory:  Negative for shortness of breath.   Cardiovascular:  Negative for chest pain.  Gastrointestinal:  Positive for abdominal pain and nausea.  All other systems reviewed and are negative.   Physical Exam Updated Vital Signs BP (!) 139/91 (BP Location: Right Arm)   Pulse 89   Temp 98.2 F (36.8 C) (Oral)   Resp 16   SpO2 100%  Physical Exam Vitals and nursing note reviewed.  Constitutional:      Comments: Ill-appearing, uncomfortable  HENT:     Head: Normocephalic and atraumatic.     Mouth/Throat:     Comments: Mucous membranes dry Eyes:     Pupils: Pupils are equal, round, and reactive to light.  Cardiovascular:     Rate and Rhythm: Normal rate and  regular rhythm.     Heart sounds: Normal heart sounds.     Comments: Port right upper chest Pulmonary:     Effort: Pulmonary effort is normal. No respiratory distress.     Breath sounds: No wheezing.  Abdominal:     General: Bowel sounds are normal.     Palpations: Abdomen is soft. There is fluid wave.     Tenderness: There is generalized abdominal tenderness. There is no guarding or rebound.  Musculoskeletal:     Cervical back: Neck supple.  Skin:    General: Skin is warm and dry.  Neurological:     Mental Status: She is alert and oriented to person, place, and time.  Psychiatric:        Mood  and Affect: Mood normal.     ED Results / Procedures / Treatments   Labs (all labs ordered are listed, but only abnormal results are displayed) Labs Reviewed  CBC WITH DIFFERENTIAL/PLATELET  COMPREHENSIVE METABOLIC PANEL  LIPASE, BLOOD  LACTIC ACID, PLASMA  LACTIC ACID, PLASMA    EKG None  Radiology No results found.  Procedures Procedures  {Document cardiac monitor, telemetry assessment procedure when appropriate:1}  Medications Ordered in ED Medications  HYDROmorphone (DILAUDID) injection 2 mg (has no administration in time range)  sodium chloride 0.9 % bolus 1,000 mL (has no administration in time range)  ondansetron (ZOFRAN) injection 4 mg (has no administration in time range)    ED Course/ Medical Decision Making/ A&P                           Medical Decision Making Amount and/or Complexity of Data Reviewed Labs: ordered.  Risk Prescription drug management.   ***  {Document critical care time when appropriate:1} {Document review of labs and clinical decision tools ie heart score, Chads2Vasc2 etc:1}  {Document your independent review of radiology images, and any outside records:1} {Document your discussion with family members, caretakers, and with consultants:1} {Document social determinants of health affecting pt's care:1} {Document your decision making why or why not admission, treatments were needed:1} Final Clinical Impression(s) / ED Diagnoses Final diagnoses:  None    Rx / DC Orders ED Discharge Orders     None

## 2022-02-27 NOTE — ED Notes (Signed)
Pt ambulatory to bathroom with little assistance

## 2022-02-27 NOTE — Discharge Instructions (Signed)
You were seen today for ongoing cancer related pain.  Hospice will come to your house later today.  Please call them when you arrive home.  You will be given a short course of oral pain medication.  Continue your PCA and usually demand doses as well.

## 2022-02-27 NOTE — ED Triage Notes (Signed)
Ambulatory to ED with c/o severe abd pain. Discharged from [place] yesterday AM with morphine PCA after admission for portal vein thrombus. Hx of pancreatic CA.   Port currently accessed with home PCA infusing.

## 2022-02-27 NOTE — Progress Notes (Signed)
[facility])   Hospice hospital liaison note     This patient is a current hospice patient with [facility], admitted 6.15.23 with a terminal diagnosis of Metastatic Pancreatic Cancer.     [facility] will continue to follow for any discharge planning needs and to coordinate continuation of hospice care.   Please don't hesitate to call with any Hospice related questions or concerns.    Thank you for the opportunity to participate in this patient's care. Jhonnie Garner, Therapist, sports, BSN, [facility] Liaison [phone]

## 2022-04-14 DEATH — deceased

## 2024-04-09 IMAGING — CT CT ABD-PELV W/ CM
2 of 4 series · 14 of 46 positions shown, 16 images · IV contrast (OMNIPAQUE 300)
Comparison: CT Abdomen and Pelvis 02/21/2022 and earlier.

CLINICAL DATA: 40-year-old female with severe abdominal pain.
Metastatic pancreatic cancer.

EXAM:
CT ABDOMEN AND PELVIS WITH CONTRAST
TECHNIQUE: Multidetector CT imaging of the abdomen and pelvis was performed
using the standard protocol following bolus administration of
intravenous contrast.

[Series 2: axial st · axial · 0.68mm/px · z∈[+1047,+1432]mm · 11 of 87 slices shown, 13 images]
[im 5/87  soft-tissue]
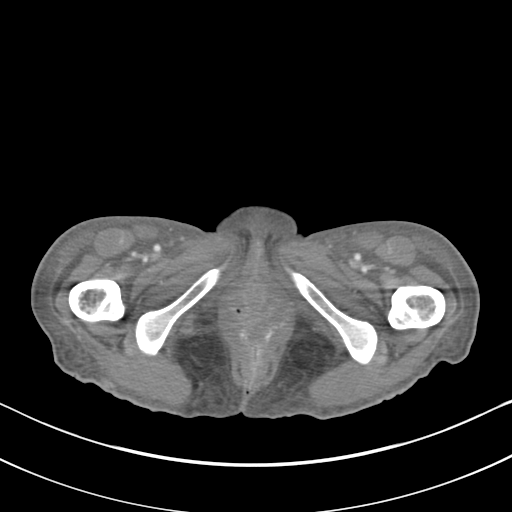
[im 5/87  bone]
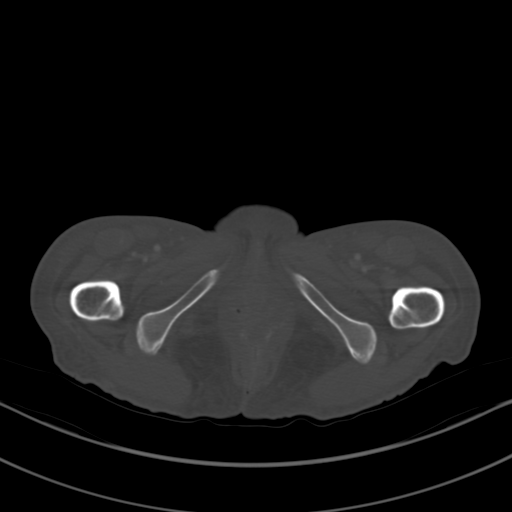
[im 13/87  soft-tissue]
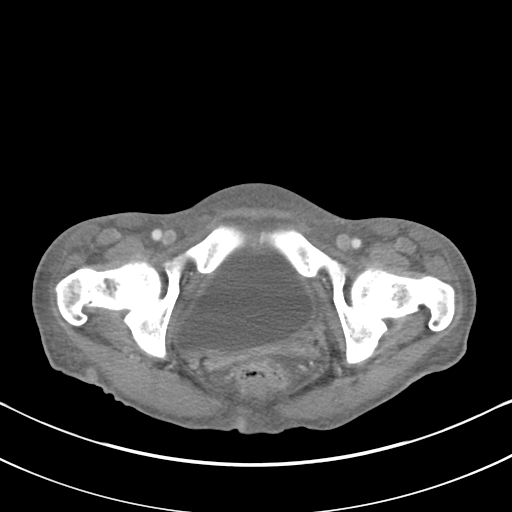
[im 22/87  soft-tissue]
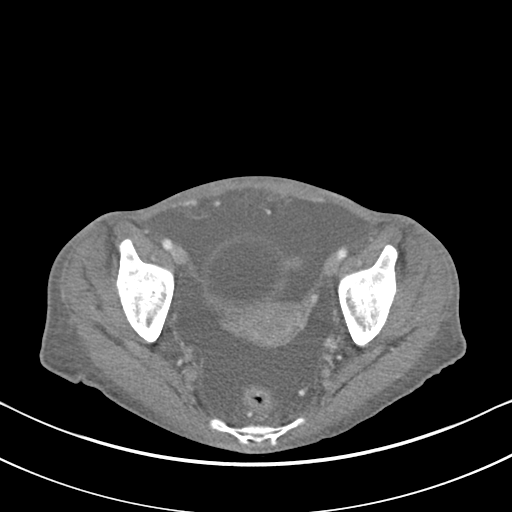
[im 31/87  soft-tissue]
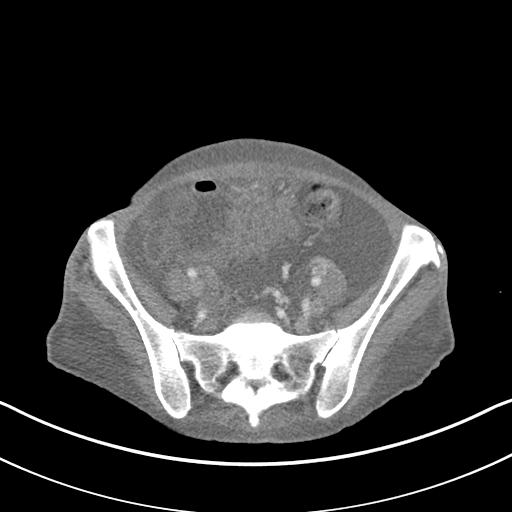
[im 35/87  soft-tissue]
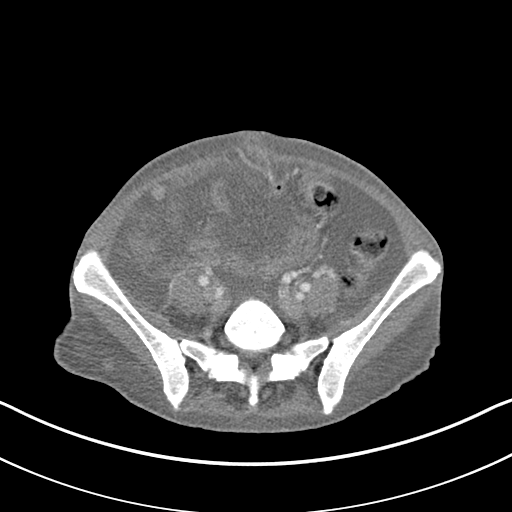
[im 44/87  soft-tissue]
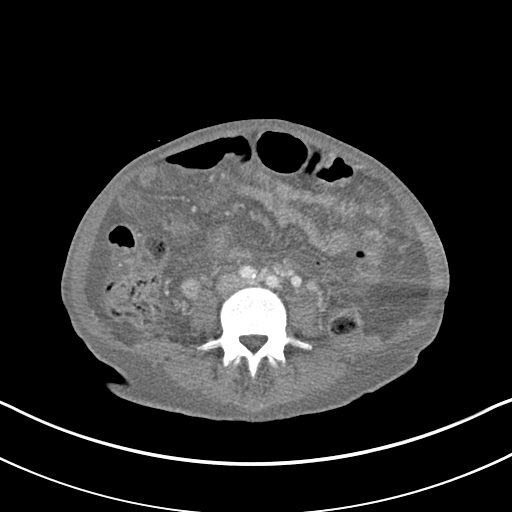
[im 52/87  soft-tissue]
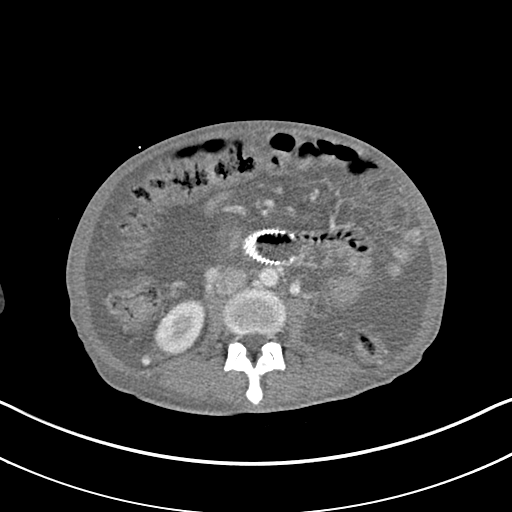
[im 56/87  soft-tissue]
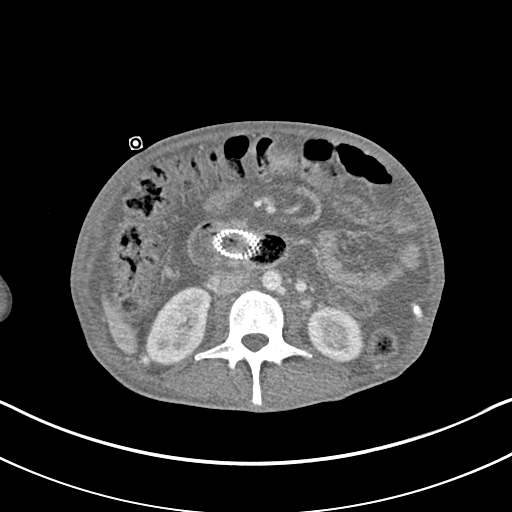
[im 65/87  soft-tissue]
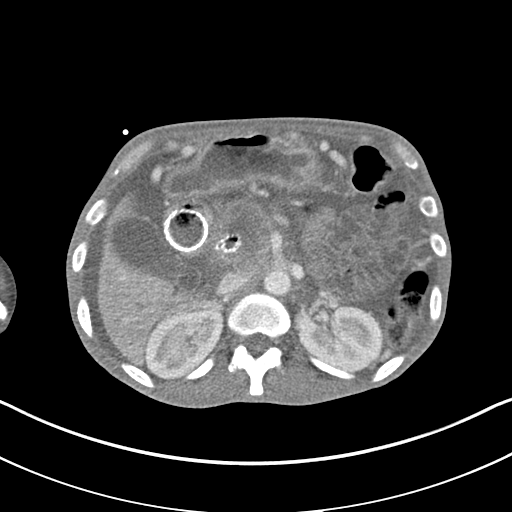
[im 65/87  bone]
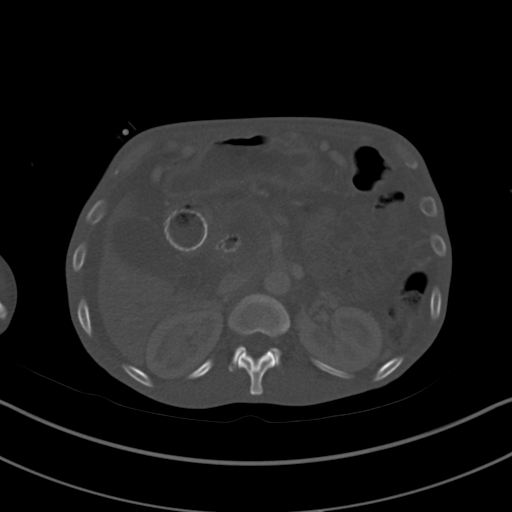
[im 74/87  soft-tissue]
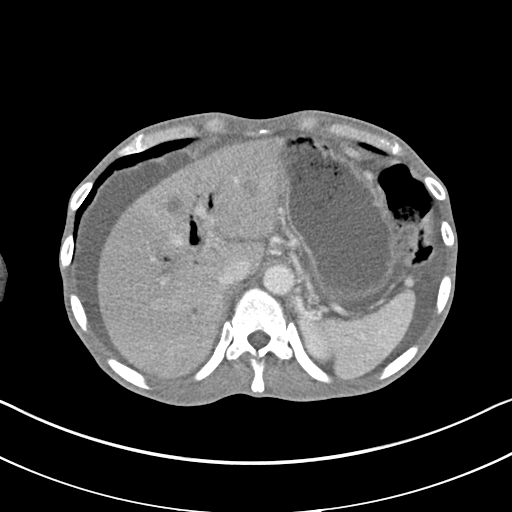
[im 82/87  soft-tissue]
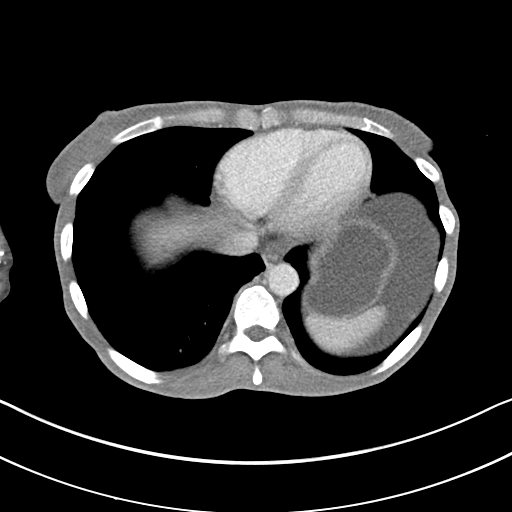

[Series 4: coronal st · coronal · 0.57mm/px · 3 of 131 slices shown]
[im 44/131  soft-tissue]
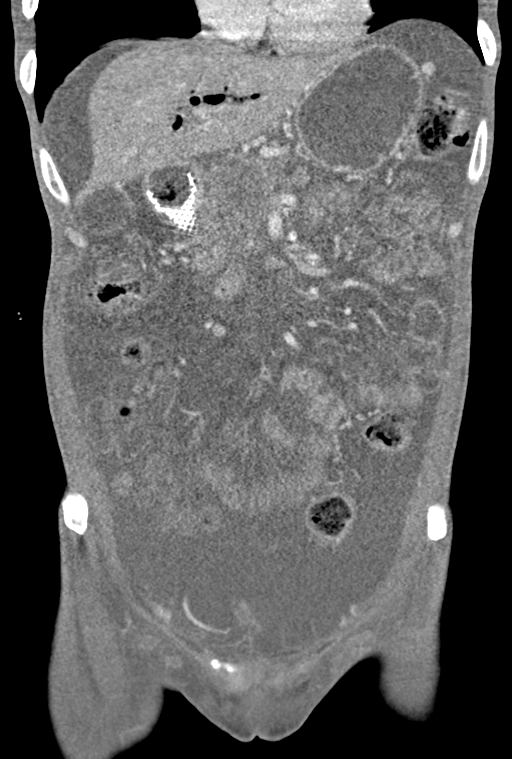
[im 58/131  soft-tissue]
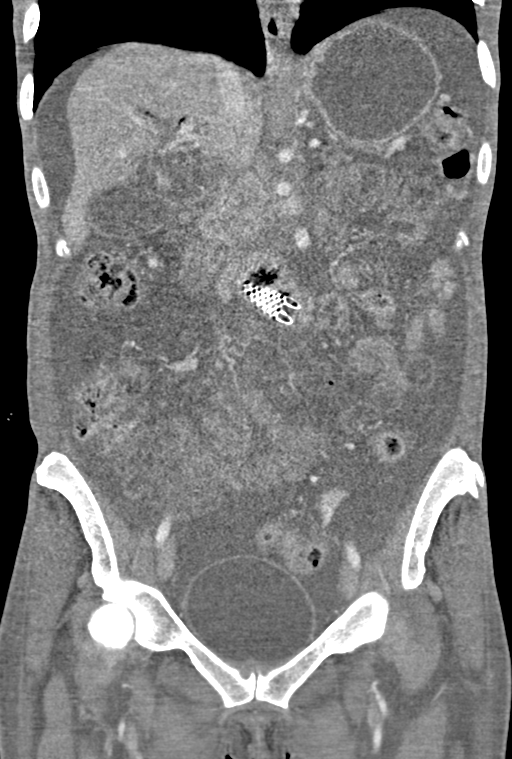
[im 73/131  soft-tissue]
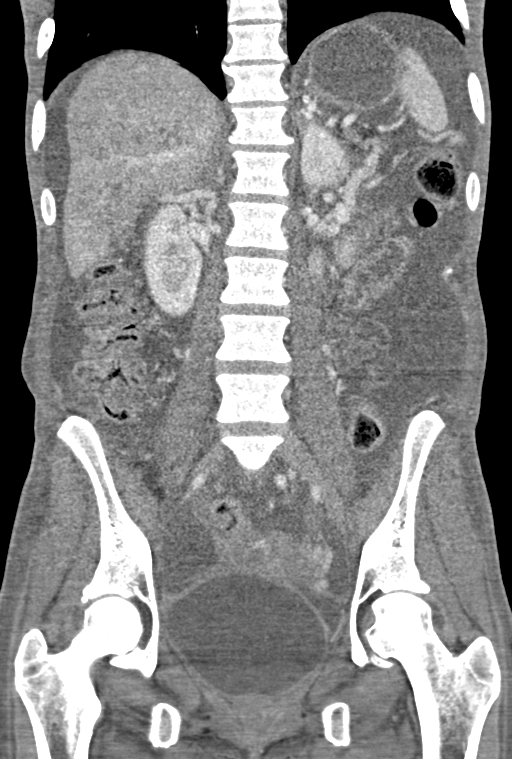

[14 of 46 positions shown; findings below may reference images not displayed]

RADIATION DOSE REDUCTION: This exam was performed according to the
departmental dose-optimization program which includes automated
exposure control, adjustment of the mA and/or kV according to
patient size and/or use of iterative reconstruction technique.

CONTRAST:  75mL OMNIPAQUE IOHEXOL 300 MG/ML  SOLN
FINDINGS: Lower chest: Stable, negative.  No pericardial or pleural effusion.

Hepatobiliary: Round hypodense liver metastases are stable from
02/21/2022, increased in size and number compared to last month.
Metallic CBD stent remains in place. Stable pneumobilia. Stable
gallbladder.

Pancreas: Indistinct pancreatic head mass with atrophied body in
tail, main ductal dilatation. Primary mass estimated size 4.1 cm
compared to 3.5 cm last month.

Spleen: No splenomegaly.  Perisplenic varices.

Adrenals/Urinary Tract: Spleno renal varices on the left. Adrenal
glands remain normal. Nonobstructed kidneys. Bladder surrounded by
ascites but otherwise unremarkable.

Stomach/Bowel:

Fluid-filled stomach with metallic stent of the gastric antrum and
duodenum which appears upsized since last month and grossly patent.
No significantly dilated stomach or bowel.

Increasing ascites since last month, stable moderate volume compared
to 02/21/2022. No free air.

Vascular/Lymphatic: Major arterial structures in the abdomen and
pelvis appear patent. Minimal atherosclerosis at the aortoiliac
bifurcation.

Main portal vein thrombosis with some cavernous transformation
underway. Improved SMV patency since 02/21/2022, residual
nonocclusive thrombus suspected on series 2, image 31.

Development of omental varices since last month, not significantly
changed and most conspicuous in the greater omentum.

No discrete upper abdominal lymphadenopathy. But suggestion of some
increasing retroperitoneal lymph nodes in the lower abdomen along
the course of the gonadal vessels series 2, image 43, [place] mm short
axis. No pelvic lymphadenopathy.

Reproductive: Negative.

Other: Ascites. Simple fluid density. Volume is moderate to large
and stable since 02/21/2022.

Musculoskeletal: No acute or suspicious osseous lesion identified.
IMPRESSION: 1. Advanced Pancreatic Malignancy not significantly changed since
02/21/2022.
Notable progression from last month includes: Portal venous
Thrombosis, development of abdominal Varices, small but increasing
Liver metastases, increasing abdominal and pelvic Ascites.

2. Metallic Gastroduodenal and CBD stents appears stable and grossly
patent. No evidence of bowel obstruction.
